# Patient Record
Sex: Female | Born: 1955
Health system: Southern US, Community
[De-identification: ages and names within clinical notes are randomized; demographics above are authoritative.]

## PROBLEM LIST (undated history)

## (undated) DIAGNOSIS — M19019 Primary osteoarthritis, unspecified shoulder: Secondary | ICD-10-CM

## (undated) DIAGNOSIS — E785 Hyperlipidemia, unspecified: Secondary | ICD-10-CM

## (undated) DIAGNOSIS — Z8639 Personal history of other endocrine, nutritional and metabolic disease: Secondary | ICD-10-CM

## (undated) DIAGNOSIS — F411 Generalized anxiety disorder: Secondary | ICD-10-CM

## (undated) DIAGNOSIS — J449 Chronic obstructive pulmonary disease, unspecified: Secondary | ICD-10-CM

## (undated) DIAGNOSIS — M545 Low back pain, unspecified: Secondary | ICD-10-CM

## (undated) DIAGNOSIS — L821 Other seborrheic keratosis: Secondary | ICD-10-CM

## (undated) DIAGNOSIS — I10 Essential (primary) hypertension: Secondary | ICD-10-CM

## (undated) DIAGNOSIS — F3289 Other specified depressive episodes: Secondary | ICD-10-CM

## (undated) DIAGNOSIS — J4489 Other specified chronic obstructive pulmonary disease: Secondary | ICD-10-CM

## (undated) DIAGNOSIS — F410 Panic disorder [episodic paroxysmal anxiety] without agoraphobia: Secondary | ICD-10-CM

## (undated) DIAGNOSIS — R5383 Other fatigue: Secondary | ICD-10-CM

## (undated) DIAGNOSIS — Z780 Asymptomatic menopausal state: Secondary | ICD-10-CM

## (undated) DIAGNOSIS — F329 Major depressive disorder, single episode, unspecified: Secondary | ICD-10-CM

## (undated) DIAGNOSIS — J309 Allergic rhinitis, unspecified: Secondary | ICD-10-CM

## (undated) DIAGNOSIS — E119 Type 2 diabetes mellitus without complications: Secondary | ICD-10-CM

## (undated) HISTORY — DX: Panic disorder (episodic paroxysmal anxiety): F41.0

## (undated) HISTORY — DX: Primary osteoarthritis, unspecified shoulder: M19.019

## (undated) HISTORY — DX: Low back pain, unspecified: M54.50

## (undated) HISTORY — DX: Other seborrheic keratosis: L82.1

## (undated) HISTORY — PX: APPENDECTOMY: SHX54

## (undated) HISTORY — DX: Personal history of other endocrine, nutritional and metabolic disease: Z86.39

## (undated) HISTORY — DX: Other fatigue: R53.83

## (undated) HISTORY — DX: Other specified depressive episodes: F32.89

## (undated) HISTORY — DX: Allergic rhinitis, unspecified: J30.9

## (undated) HISTORY — DX: Chronic obstructive pulmonary disease, unspecified: J44.9

## (undated) HISTORY — DX: Hyperlipidemia, unspecified: E78.5

## (undated) HISTORY — DX: Low back pain: M54.5

## (undated) HISTORY — DX: Type 2 diabetes mellitus without complications: E11.9

## (undated) HISTORY — DX: Other specified chronic obstructive pulmonary disease: J44.89

## (undated) HISTORY — PX: TONSILLECTOMY: SUR1361

## (undated) HISTORY — DX: Asymptomatic menopausal state: Z78.0

## (undated) HISTORY — DX: Essential (primary) hypertension: I10

## (undated) HISTORY — DX: Generalized anxiety disorder: F41.1

## (undated) HISTORY — DX: Major depressive disorder, single episode, unspecified: F32.9

## (undated) HISTORY — PX: TUBAL LIGATION: SHX77

---

## 2000-01-06 ENCOUNTER — Other Ambulatory Visit: Admission: RE | Admit: 2000-01-06 | Discharge: 2000-01-06 | Payer: Self-pay | Admitting: Family Medicine

## 2000-06-28 ENCOUNTER — Encounter: Admission: RE | Admit: 2000-06-28 | Discharge: 2000-09-26 | Payer: Self-pay | Admitting: Orthopaedic Surgery

## 2001-03-21 ENCOUNTER — Other Ambulatory Visit: Admission: RE | Admit: 2001-03-21 | Discharge: 2001-03-21 | Payer: Self-pay | Admitting: Family Medicine

## 2001-12-05 ENCOUNTER — Encounter: Admission: RE | Admit: 2001-12-05 | Discharge: 2001-12-05 | Payer: Self-pay | Admitting: General Surgery

## 2001-12-05 ENCOUNTER — Encounter: Payer: Self-pay | Admitting: General Surgery

## 2009-08-11 DIAGNOSIS — J449 Chronic obstructive pulmonary disease, unspecified: Secondary | ICD-10-CM

## 2009-08-11 DIAGNOSIS — M940 Chondrocostal junction syndrome [Tietze]: Secondary | ICD-10-CM | POA: Insufficient documentation

## 2009-08-12 ENCOUNTER — Ambulatory Visit: Payer: Self-pay | Admitting: Critical Care Medicine

## 2009-08-12 ENCOUNTER — Telehealth (INDEPENDENT_AMBULATORY_CARE_PROVIDER_SITE_OTHER): Payer: Self-pay | Admitting: *Deleted

## 2009-08-12 DIAGNOSIS — F3289 Other specified depressive episodes: Secondary | ICD-10-CM | POA: Insufficient documentation

## 2009-08-12 DIAGNOSIS — I10 Essential (primary) hypertension: Secondary | ICD-10-CM | POA: Insufficient documentation

## 2009-08-12 DIAGNOSIS — F17200 Nicotine dependence, unspecified, uncomplicated: Secondary | ICD-10-CM | POA: Insufficient documentation

## 2009-08-12 DIAGNOSIS — F329 Major depressive disorder, single episode, unspecified: Secondary | ICD-10-CM

## 2009-08-14 ENCOUNTER — Telehealth (INDEPENDENT_AMBULATORY_CARE_PROVIDER_SITE_OTHER): Payer: Self-pay | Admitting: *Deleted

## 2009-08-25 ENCOUNTER — Telehealth: Payer: Self-pay | Admitting: Internal Medicine

## 2009-09-02 ENCOUNTER — Ambulatory Visit: Payer: Self-pay | Admitting: Critical Care Medicine

## 2009-09-03 ENCOUNTER — Encounter: Payer: Self-pay | Admitting: Critical Care Medicine

## 2009-11-12 ENCOUNTER — Telehealth (INDEPENDENT_AMBULATORY_CARE_PROVIDER_SITE_OTHER): Payer: Self-pay | Admitting: *Deleted

## 2009-11-18 ENCOUNTER — Telehealth (INDEPENDENT_AMBULATORY_CARE_PROVIDER_SITE_OTHER): Payer: Self-pay | Admitting: *Deleted

## 2009-12-01 ENCOUNTER — Ambulatory Visit: Payer: Self-pay | Admitting: Critical Care Medicine

## 2009-12-23 ENCOUNTER — Telehealth (INDEPENDENT_AMBULATORY_CARE_PROVIDER_SITE_OTHER): Payer: Self-pay | Admitting: *Deleted

## 2009-12-24 ENCOUNTER — Encounter: Payer: Self-pay | Admitting: Critical Care Medicine

## 2012-09-13 ENCOUNTER — Ambulatory Visit: Payer: Self-pay | Admitting: Pulmonary Disease

## 2012-10-05 ENCOUNTER — Encounter: Payer: Self-pay | Admitting: *Deleted

## 2012-10-05 ENCOUNTER — Encounter: Payer: Self-pay | Admitting: Critical Care Medicine

## 2012-10-06 ENCOUNTER — Ambulatory Visit: Payer: Self-pay | Admitting: Critical Care Medicine

## 2012-10-13 ENCOUNTER — Ambulatory Visit: Payer: Self-pay | Admitting: Critical Care Medicine

## 2012-12-27 HISTORY — PX: BREAST BIOPSY: SHX20

## 2013-02-07 ENCOUNTER — Encounter: Payer: Self-pay | Admitting: Critical Care Medicine

## 2013-02-07 ENCOUNTER — Ambulatory Visit (INDEPENDENT_AMBULATORY_CARE_PROVIDER_SITE_OTHER): Payer: Managed Care, Other (non HMO) | Admitting: Critical Care Medicine

## 2013-02-07 ENCOUNTER — Ambulatory Visit (INDEPENDENT_AMBULATORY_CARE_PROVIDER_SITE_OTHER)
Admission: RE | Admit: 2013-02-07 | Discharge: 2013-02-07 | Disposition: A | Discharge status: Home / Self Care | Payer: Managed Care, Other (non HMO) | Source: Ambulatory Visit | Attending: Critical Care Medicine | Admitting: Critical Care Medicine

## 2013-02-07 VITALS — BP 112/68 | HR 93 | Temp 98.5°F | Ht 61.5 in | Wt 130.0 lb

## 2013-02-07 DIAGNOSIS — J441 Chronic obstructive pulmonary disease with (acute) exacerbation: Secondary | ICD-10-CM

## 2013-02-07 DIAGNOSIS — F17200 Nicotine dependence, unspecified, uncomplicated: Secondary | ICD-10-CM

## 2013-02-07 MED ORDER — FLUTICASONE PROPIONATE 50 MCG/ACT NA SUSP: 2.0000 | Freq: Every day | NASAL | Status: DC

## 2013-02-07 MED ORDER — ALBUTEROL SULFATE HFA 108 (90 BASE) MCG/ACT IN AERS: 1.0000 | INHALATION_SPRAY | Freq: Four times a day (QID) | RESPIRATORY_TRACT | Status: DC | PRN

## 2013-02-07 NOTE — Assessment & Plan Note (Signed)
Ongoing smoking use The patient was given 3-10 minutes of smoking cessation counseling The patient is to pursue nicotine replacement therapy with NicoDerm patch and Nicorette mini lozenges

## 2013-02-07 NOTE — Progress Notes (Signed)
Subjective:    Patient ID: Jeanne Becker, female    DOB: 12/15/1956, 57 y.o.   MRN: [id number]  HPI Comments: Hx of Copd Dx several years ago, not seen since 2010 Hx of bronchitis .  Still smokes, now down to 1PPD Pt ill last week and rx pred pulse, Zpak x 2  Shortness of Breath This is a chronic problem. The current episode started more than 1 year ago. The problem occurs daily (rest and exertional dyspnea). The problem has been gradually worsening. Associated symptoms include chest pain, rhinorrhea, sputum production and wheezing. Pertinent negatives include no abdominal pain, claudication, coryza, ear pain, fever, headaches, hemoptysis, leg pain, leg swelling, neck pain, orthopnea, PND, rash, sore throat, swollen glands, syncope or vomiting. The symptoms are aggravated by any activity, exercise, fumes, odors, smoke and weather changes. Associated symptoms comments: Cough is prod thick white mucus. Risk factors include smoking. She has tried beta agonist inhalers, steroid inhalers and oral steroids for the symptoms. The treatment provided moderate relief. Her past medical history is significant for COPD. There is no history of allergies, aspirin allergies, asthma, bronchiolitis, CAD, chronic lung disease, DVT, a heart failure, PE, pneumonia or a recent surgery.   Past Medical History  Diagnosis Date  . Acute bronchitis   . Acute suppurative otitis media without spontaneous rupture of eardrum   . Allergic rhinitis, cause unspecified   . Anxiety state, unspecified   . Unspecified arthropathy, shoulder region   . Essential hypertension, benign   . Obstructive chronic bronchitis with exacerbation   . Chronic airway obstruction, not elsewhere classified   . Depressive disorder, not elsewhere classified   . Type II or unspecified type diabetes mellitus without mention of complication, not stated as uncontrolled   . Fatigue   . Hyperlipidemia   . Lower back pain   . Menopause   . Panic  disorder without agoraphobia   . Seborrheic keratosis      Family History  Problem Relation Age of Onset  . Cancer    . Allergies    . Diabetes    . Heart disease    . Hypertension    . Migraines    . Osteoporosis    . Seizures       History   Social History  . Marital Status: Single    Spouse Name: N/A    Number of Children: N/A  . Years of Education: N/A   Occupational History  . Not on file.   Social History Main Topics  . Smoking status: Current Every Day Smoker -- 1.00 packs/day for 30 years    Types: Cigarettes  . Smokeless tobacco: Not on file     Comment: greater 50 pack yr  . Alcohol Use: Yes     Comment: 2 drinks/day or fewer  . Drug Use: Not on file  . Sexually Active: Not on file   Other Topics Concern  . Not on file   Social History Narrative  . No narrative on file     Allergies  Allergen Reactions  . Penicillins      Outpatient Prescriptions Prior to Visit  Medication Sig Dispense Refill  . amLODipine-olmesartan (AZOR) 5-40 MG per tablet Take 1 tablet by mouth daily.      Marland Kitchen aspirin 81 MG tablet Take 81 mg by mouth daily.      . clonazePAM (KLONOPIN) 0.5 MG tablet Take 0.5 mg by mouth 2 (two) times daily as needed.      Marland Kitchen  escitalopram (LEXAPRO) 10 MG tablet Take 10 mg by mouth daily.      . metFORMIN (GLUCOPHAGE-XR) 500 MG 24 hr tablet Take 500 mg by mouth daily with breakfast.      . rosuvastatin (CRESTOR) 10 MG tablet Take 10 mg by mouth daily.       No facility-administered medications prior to visit.       Review of Systems  Constitutional: Negative for fever and unexpected weight change.  HENT: Positive for congestion, rhinorrhea, postnasal drip and sinus pressure. Negative for ear pain, nosebleeds, sore throat, sneezing, trouble swallowing, neck pain and dental problem.   Eyes: Negative for redness and itching.  Respiratory: Positive for cough, sputum production, shortness of breath and wheezing. Negative for hemoptysis and chest  tightness.   Cardiovascular: Positive for chest pain. Negative for palpitations, orthopnea, claudication, leg swelling, syncope and PND.  Gastrointestinal: Negative for nausea, vomiting and abdominal pain.  Genitourinary: Negative for dysuria.  Musculoskeletal: Negative for joint swelling.  Skin: Negative for rash.  Neurological: Negative for headaches.  Hematological: Does not bruise/bleed easily.  Psychiatric/Behavioral: Negative for dysphoric mood. The patient is not nervous/anxious.        Objective:   Physical Exam Filed Vitals:   02/07/13 0945  BP: 112/68  Pulse: 93  Temp: 98.5 F (36.9 C)  TempSrc: Oral  Height: 5' 1.5" (1.562 m)  Weight: 130 lb (58.968 kg)  SpO2: 98%    Gen: Pleasant, well-nourished, in no distress,  normal affect  ENT: No lesions,  mouth clear,  oropharynx clear, no postnasal drip  Neck: No JVD, no TMG, no carotid bruits  Lungs: No use of accessory muscles, no dullness to percussion, distant breath sounds a few expired wheezes  Cardiovascular: RRR, heart sounds normal, no murmur or gallops, no peripheral edema  Abdomen: soft and NT, no HSM,  BS normal  Musculoskeletal: No deformities, no cyanosis or clubbing  Neuro: alert, non focal  Skin: Warm, no lesions or rashes  Dg Chest 2 View  02/07/2013  *RADIOLOGY REPORT*  Clinical Data: Cough, shortness of breath, chest pain.  CHEST - 2 VIEW  Comparison: 05/29/2010  Findings: Slight peribronchial thickening. Heart and mediastinal contours are within normal limits.  No focal opacities or effusions.  No acute bony abnormality.  IMPRESSION: Slight bronchitic changes.   Original Report Authenticated By: Charlett Nose, M.D.           Assessment & Plan:   No problem-specific assessment & plan notes found for this encounter.   Updated Medication List Outpatient Encounter Prescriptions as of 02/07/2013  Medication Sig Dispense Refill  . albuterol (PROVENTIL HFA;VENTOLIN HFA) 108 (90 BASE) MCG/ACT  inhaler Inhale 1-2 puffs into the lungs every 6 (six) hours as needed for wheezing.  1 Inhaler  6  . albuterol (PROVENTIL) (2.5 MG/3ML) 0.083% nebulizer solution Take 2.5 mg by nebulization 2 (two) times daily.      Marland Kitchen amLODipine-olmesartan (AZOR) 5-40 MG per tablet Take 1 tablet by mouth daily.      Marland Kitchen aspirin 81 MG tablet Take 81 mg by mouth daily.      Marland Kitchen azithromycin (ZITHROMAX) 250 MG tablet Take 250 mg by mouth daily. Take two once then one daily until gone      . budesonide-formoterol (SYMBICORT) 160-4.5 MCG/ACT inhaler Inhale 2 puffs into the lungs 2 (two) times daily.      . clonazePAM (KLONOPIN) 0.5 MG tablet Take 0.5 mg by mouth 2 (two) times daily as needed.      Marland Kitchen  escitalopram (LEXAPRO) 10 MG tablet Take 10 mg by mouth daily.      . metFORMIN (GLUCOPHAGE-XR) 500 MG 24 hr tablet Take 500 mg by mouth daily with breakfast.      . predniSONE (DELTASONE) 20 MG tablet Take 20 mg by mouth daily. Three x 3 days  Two x 3 days one x 3 days      . rosuvastatin (CRESTOR) 10 MG tablet Take 10 mg by mouth daily.      . [DISCONTINUED] albuterol (PROVENTIL HFA;VENTOLIN HFA) 108 (90 BASE) MCG/ACT inhaler Inhale 1-2 puffs into the lungs every 6 (six) hours as needed for wheezing.      . fluticasone (FLONASE) 50 MCG/ACT nasal spray Place 2 sprays into the nose daily.  16 g  2   No facility-administered encounter medications on file as of 02/07/2013.

## 2013-02-07 NOTE — Assessment & Plan Note (Signed)
Chronic obstructive lung disease with recurrent exacerbation and ongoing smoking use Plan Start flonase two puff daily each nostril Use symbicort two puff twice daily, work on inhaler technique Stop smoking , use nicoderm patch step one and nicorette minis 4mg  2-4 x per day Finish prednisone and zpak Chest xray today Schedule pulmonary functions Return 2 months

## 2013-02-07 NOTE — Patient Instructions (Addendum)
Start flonase two puff daily each nostril Use symbicort two puff twice daily, work on inhaler technique Stop smoking , use nicoderm patch step one and nicorette minis 4mg  2-4 x per day Finish prednisone and zpak Chest xray today Schedule pulmonary functions Return 2 months

## 2013-02-07 NOTE — Progress Notes (Signed)
Quick Note:  Notify the patient that the Xray is stable and no pneumonia No change in medications are recommended. Continue current meds as prescribed at last office visit ______ 

## 2013-02-12 ENCOUNTER — Telehealth: Payer: Self-pay | Admitting: Critical Care Medicine

## 2013-02-12 NOTE — Telephone Encounter (Signed)
Notes Recorded by Storm Frisk, MD on 02/07/2013 at 1:30 PM Notify the patient that the Xray is stable and no pneumonia No change in medications are recommended. Continue current meds as prescribed at last office visit      ATC the pt back with results, NA and no option to leave a msg, [facility]

## 2013-02-12 NOTE — Telephone Encounter (Signed)
Pt called back again re: same. Says she has to go to work soon and wanted results this morning. Jeanne Becker

## 2013-02-13 NOTE — Telephone Encounter (Signed)
Called, spoke with pt. Informed her of cxr results and recs per Dr. Delford Field.  She verbalized understanding of this and voiced no further questions or concerns at this time.

## 2013-02-13 NOTE — Progress Notes (Signed)
Quick Note:  Spoke with pt. Informed her of cxr results and recs per Dr. Delford Field. She verbalized understanding of this and voiced no further questions or concerns at this time. ______

## 2013-04-24 ENCOUNTER — Encounter: Payer: Self-pay | Admitting: Critical Care Medicine

## 2013-04-24 ENCOUNTER — Ambulatory Visit (INDEPENDENT_AMBULATORY_CARE_PROVIDER_SITE_OTHER): Payer: Managed Care, Other (non HMO) | Admitting: Critical Care Medicine

## 2013-04-24 VITALS — BP 114/70 | HR 95 | Temp 98.4°F | Ht 61.0 in | Wt 127.0 lb

## 2013-04-24 DIAGNOSIS — R0602 Shortness of breath: Secondary | ICD-10-CM

## 2013-04-24 DIAGNOSIS — J441 Chronic obstructive pulmonary disease with (acute) exacerbation: Secondary | ICD-10-CM

## 2013-04-24 LAB — PULMONARY FUNCTION TEST

## 2013-04-24 MED ORDER — GUAIFENESIN ER 1200 MG PO TB12: 1.0000 | ORAL_TABLET | Freq: Two times a day (BID) | ORAL | Status: DC

## 2013-04-24 NOTE — Progress Notes (Signed)
Subjective:    Patient ID: Jeanne Becker, female    DOB: 1956/12/24, 57 y.o.   MRN: [id number]  HPI 04/24/2013 Now off cigarettes. Smokes about 5-6 puffs per day of e-cigarettes.  Patches and pills both made ill.  PFTs c/w asthmatic bronchitis.   Still coughing, thick white mucus. Still dyspneic esp at work, with the patient is exposed to [place] fumes and smoke fumes working at a barbecue   Past Medical History  Diagnosis Date  . Acute bronchitis   . Acute suppurative otitis media without spontaneous rupture of eardrum   . Allergic rhinitis, cause unspecified   . Anxiety state, unspecified   . Unspecified arthropathy, shoulder region   . Essential hypertension, benign   . Obstructive chronic bronchitis with exacerbation   . Chronic airway obstruction, not elsewhere classified   . Depressive disorder, not elsewhere classified   . Type II or unspecified type diabetes mellitus without mention of complication, not stated as uncontrolled   . Fatigue   . Hyperlipidemia   . Lower back pain   . Menopause   . Panic disorder without agoraphobia   . Seborrheic keratosis      Family History  Problem Relation Age of Onset  . Cancer    . Allergies    . Diabetes    . Heart disease    . Hypertension    . Migraines    . Osteoporosis    . Seizures       History   Social History  . Marital Status: Single    Spouse Name: N/A    Number of Children: N/A  . Years of Education: N/A   Occupational History  . Not on file.   Social History Main Topics  . Smoking status: Former Smoker -- 1.00 packs/day for 30 years    Types: Cigarettes    Quit date: 04/11/2013  . Smokeless tobacco: Not on file     Comment: greater 50 pack yr  . Alcohol Use: Yes     Comment: 2 drinks/day or fewer  . Drug Use: Not on file  . Sexually Active: Not on file   Other Topics Concern  . Not on file   Social History Narrative  . No narrative on file     Allergies  Allergen Reactions  .  Penicillins      Outpatient Prescriptions Prior to Visit  Medication Sig Dispense Refill  . albuterol (PROVENTIL HFA;VENTOLIN HFA) 108 (90 BASE) MCG/ACT inhaler Inhale 1-2 puffs into the lungs every 6 (six) hours as needed for wheezing.  1 Inhaler  6  . albuterol (PROVENTIL) (2.5 MG/3ML) 0.083% nebulizer solution Take 2.5 mg by nebulization 2 (two) times daily.      Marland Kitchen amLODipine-olmesartan (AZOR) 5-40 MG per tablet Take 1 tablet by mouth daily.      Marland Kitchen aspirin 81 MG tablet Take 81 mg by mouth daily.      . budesonide-formoterol (SYMBICORT) 160-4.5 MCG/ACT inhaler Inhale 2 puffs into the lungs 2 (two) times daily.      . clonazePAM (KLONOPIN) 0.5 MG tablet Take 0.5 mg by mouth 2 (two) times daily as needed.      . fluticasone (FLONASE) 50 MCG/ACT nasal spray Place 2 sprays into the nose daily.  16 g  2  . metFORMIN (GLUCOPHAGE-XR) 500 MG 24 hr tablet Take 500 mg by mouth daily with breakfast.      . rosuvastatin (CRESTOR) 10 MG tablet Take 10 mg by mouth daily.      Marland Kitchen  azithromycin (ZITHROMAX) 250 MG tablet Take 250 mg by mouth daily. Take two once then one daily until gone      . escitalopram (LEXAPRO) 10 MG tablet Take 10 mg by mouth daily.      . predniSONE (DELTASONE) 20 MG tablet Take 20 mg by mouth daily. Three x 3 days  Two x 3 days one x 3 days       No facility-administered medications prior to visit.       Review of Systems  Constitutional: Negative for unexpected weight change.  HENT: Positive for congestion, postnasal drip and sinus pressure. Negative for nosebleeds, sneezing, trouble swallowing and dental problem.   Eyes: Negative for redness and itching.  Respiratory: Positive for cough. Negative for chest tightness.   Cardiovascular: Negative for palpitations.  Gastrointestinal: Negative for nausea.  Genitourinary: Negative for dysuria.  Musculoskeletal: Negative for joint swelling.  Hematological: Does not bruise/bleed easily.  Psychiatric/Behavioral: Negative for  dysphoric mood. The patient is not nervous/anxious.        Objective:   Physical Exam  Filed Vitals:   04/24/13 0958  BP: 114/70  Pulse: 95  Temp: 98.4 F (36.9 C)  TempSrc: Oral  Height: 5\' 1"  (1.549 m)  Weight: 127 lb (57.607 kg)  SpO2: 93%    Gen: Pleasant, well-nourished, in no distress,  normal affect  ENT: No lesions,  mouth clear,  oropharynx clear, no postnasal drip  Neck: No JVD, no TMG, no carotid bruits  Lungs: No use of accessory muscles, no dullness to percussion, distant breath sounds a few expired wheezes  Cardiovascular: RRR, heart sounds normal, no murmur or gallops, no peripheral edema  Abdomen: soft and NT, no HSM,  BS normal  Musculoskeletal: No deformities, no cyanosis or clubbing  Neuro: alert, non focal  Skin: Warm, no lesions or rashes  No results found.  Pulmonary functions from 04/24/2013 reviewed and show mild obstruction in the large airways and moderate obstruction in the small airways with air trapping and preserved diffusion capacity      Assessment & Plan:   Chronic obstructive lung disease with recurrent exacerbation and ongoing smoking use Chronic obstructive lung disease with recurrent exacerbations owing to ongoing smoking use Slowly improved with reduction of cigarettes per is still an issue with the use of E cigarettes Plan Start Mucinex 1200mg  (max strength) one twice daily Wean e cigs off over 3 months Stay on symbicort twice daily Return 4 months     Updated Medication List Outpatient Encounter Prescriptions as of 04/24/2013  Medication Sig Dispense Refill  . albuterol (PROVENTIL HFA;VENTOLIN HFA) 108 (90 BASE) MCG/ACT inhaler Inhale 1-2 puffs into the lungs every 6 (six) hours as needed for wheezing.  1 Inhaler  6  . albuterol (PROVENTIL) (2.5 MG/3ML) 0.083% nebulizer solution Take 2.5 mg by nebulization 2 (two) times daily.      Marland Kitchen amLODipine-olmesartan (AZOR) 5-40 MG per tablet Take 1 tablet by mouth daily.       Marland Kitchen aspirin 81 MG tablet Take 81 mg by mouth daily.      . budesonide-formoterol (SYMBICORT) 160-4.5 MCG/ACT inhaler Inhale 2 puffs into the lungs 2 (two) times daily.      . clonazePAM (KLONOPIN) 0.5 MG tablet Take 0.5 mg by mouth 2 (two) times daily as needed.      Marland Kitchen escitalopram (LEXAPRO) 20 MG tablet Take 20 mg by mouth daily.      . fluticasone (FLONASE) 50 MCG/ACT nasal spray Place 2 sprays into the nose daily.  16 g  2  . metFORMIN (GLUCOPHAGE-XR) 500 MG 24 hr tablet Take 500 mg by mouth daily with breakfast.      . rosuvastatin (CRESTOR) 10 MG tablet Take 10 mg by mouth daily.      . Guaifenesin (MUCINEX MAXIMUM STRENGTH) 1200 MG TB12 Take 1 tablet (1,200 mg total) by mouth 2 (two) times daily.  14 each  0  . [DISCONTINUED] azithromycin (ZITHROMAX) 250 MG tablet Take 250 mg by mouth daily. Take two once then one daily until gone      . [DISCONTINUED] escitalopram (LEXAPRO) 10 MG tablet Take 10 mg by mouth daily.      . [DISCONTINUED] predniSONE (DELTASONE) 20 MG tablet Take 20 mg by mouth daily. Three x 3 days  Two x 3 days one x 3 days       No facility-administered encounter medications on file as of 04/24/2013.

## 2013-04-24 NOTE — Progress Notes (Signed)
PFT done today. 

## 2013-04-24 NOTE — Assessment & Plan Note (Signed)
Chronic obstructive lung disease with recurrent exacerbations owing to ongoing smoking use Slowly improved with reduction of cigarettes per is still an issue with the use of E cigarettes Plan Start Mucinex 1200mg  (max strength) one twice daily Wean e cigs off over 3 months Stay on symbicort twice daily Return 4 months

## 2013-04-24 NOTE — Patient Instructions (Addendum)
Start Mucinex 1200mg  (max strength) one twice daily Wean e cigs off over 3 months Stay on symbicort twice daily Return 4 months

## 2013-05-10 ENCOUNTER — Encounter: Payer: Self-pay | Admitting: Critical Care Medicine

## 2013-08-21 ENCOUNTER — Ambulatory Visit: Payer: Managed Care, Other (non HMO) | Admitting: Critical Care Medicine

## 2013-08-29 ENCOUNTER — Ambulatory Visit: Payer: Managed Care, Other (non HMO) | Admitting: Critical Care Medicine

## 2013-08-31 ENCOUNTER — Ambulatory Visit (INDEPENDENT_AMBULATORY_CARE_PROVIDER_SITE_OTHER): Payer: Managed Care, Other (non HMO) | Admitting: Critical Care Medicine

## 2013-08-31 ENCOUNTER — Encounter: Payer: Self-pay | Admitting: Critical Care Medicine

## 2013-08-31 VITALS — BP 116/78 | HR 92 | Temp 98.0°F | Ht 61.5 in | Wt 129.8 lb

## 2013-08-31 DIAGNOSIS — J441 Chronic obstructive pulmonary disease with (acute) exacerbation: Secondary | ICD-10-CM

## 2013-08-31 DIAGNOSIS — Z23 Encounter for immunization: Secondary | ICD-10-CM

## 2013-08-31 MED ORDER — BUDESONIDE-FORMOTEROL FUMARATE 160-4.5 MCG/ACT IN AERO: 2.0000 | INHALATION_SPRAY | Freq: Two times a day (BID) | RESPIRATORY_TRACT | Status: DC

## 2013-08-31 MED ORDER — ALBUTEROL SULFATE HFA 108 (90 BASE) MCG/ACT IN AERS: 1.0000 | INHALATION_SPRAY | Freq: Four times a day (QID) | RESPIRATORY_TRACT | Status: DC | PRN

## 2013-08-31 NOTE — Patient Instructions (Addendum)
No change in medications Use nicoderm 7mg  patch and eliminate the e cig A Flu vaccine was given A pneumovax was given Return 4 months

## 2013-08-31 NOTE — Progress Notes (Signed)
Subjective:    Patient ID: Jeanne Becker, female    DOB: [birth year], 57 y.o.   MRN: [id number]  HPI  04/24/2013 Now off cigarettes. Smokes about 5-6 puffs per day of e-cigarettes.  Patches and pills both made ill.  PFTs c/w asthmatic bronchitis.   Still coughing, thick white mucus. Still dyspneic esp at work, with the patient is exposed to [place] fumes and smoke fumes working at a barbecue  08/31/2013 Chief Complaint  Patient presents with  . 4 month follow up    Has good days and bad days.  Hot, humid weather affects breathing.  Has dry cough, wheezing, and burning in ribs.    Dyspnea about the same.  Not able to work and signed up for disability.  Notes a dry cough and if bend over burns in ribs. Quit cigarettes.  Min e cigs. Nausea from nicorette minis.   Past Medical History  Diagnosis Date  . Allergic rhinitis, cause unspecified   . Anxiety state, unspecified   . Unspecified arthropathy, shoulder region   . Essential hypertension, benign   . Chronic airway obstruction, not elsewhere classified   . Depressive disorder, not elsewhere classified   . Type II or unspecified type diabetes mellitus without mention of complication, not stated as uncontrolled   . Fatigue   . Hyperlipidemia   . Lower back pain   . Menopause   . Panic disorder without agoraphobia   . Seborrheic keratosis      Family History  Problem Relation Age of Onset  . Cancer    . Allergies    . Diabetes    . Heart disease    . Hypertension    . Migraines    . Osteoporosis    . Seizures       History   Social History  . Marital Status: Single    Spouse Name: N/A    Number of Children: N/A  . Years of Education: N/A   Occupational History  . Not on file.   Social History Main Topics  . Smoking status: Former Smoker -- 1.00 packs/day for 30 years    Types: Cigarettes    Quit date: 04/11/2013  . Smokeless tobacco: Never Used     Comment: greater 50 pack yr.  Occasional e-cig use.   Marland Kitchen  Alcohol Use: Yes     Comment: 2 drinks/day or fewer  . Drug Use: Not on file  . Sexual Activity: Not on file   Other Topics Concern  . Not on file   Social History Narrative  . No narrative on file     Allergies  Allergen Reactions  . Penicillins      Outpatient Prescriptions Prior to Visit  Medication Sig Dispense Refill  . albuterol (PROVENTIL) (2.5 MG/3ML) 0.083% nebulizer solution Take 2.5 mg by nebulization 2 (two) times daily.      Marland Kitchen aspirin 81 MG tablet Take 81 mg by mouth daily.      . clonazePAM (KLONOPIN) 0.5 MG tablet Take 0.5 mg by mouth 2 (two) times daily as needed.      Marland Kitchen escitalopram (LEXAPRO) 20 MG tablet Take 20 mg by mouth daily.      . metFORMIN (GLUCOPHAGE-XR) 500 MG 24 hr tablet Take 500 mg by mouth daily with breakfast.      . rosuvastatin (CRESTOR) 10 MG tablet Take 10 mg by mouth daily.      . budesonide-formoterol (SYMBICORT) 160-4.5 MCG/ACT inhaler Inhale 2 puffs into the lungs 2 (  two) times daily.      . fluticasone (FLONASE) 50 MCG/ACT nasal spray Place 2 sprays into the nose daily.  16 g  2  . Guaifenesin (MUCINEX MAXIMUM STRENGTH) 1200 MG TB12 Take 1 tablet (1,200 mg total) by mouth 2 (two) times daily.  14 each  0  . albuterol (PROVENTIL HFA;VENTOLIN HFA) 108 (90 BASE) MCG/ACT inhaler Inhale 1-2 puffs into the lungs every 6 (six) hours as needed for wheezing.  1 Inhaler  6  . amLODipine-olmesartan (AZOR) 5-40 MG per tablet Take 1 tablet by mouth daily.       No facility-administered medications prior to visit.       Review of Systems  Constitutional: Negative for unexpected weight change.  HENT: Positive for congestion, postnasal drip and sinus pressure. Negative for nosebleeds, sneezing, trouble swallowing and dental problem.   Eyes: Negative for redness and itching.  Respiratory: Positive for cough. Negative for chest tightness.   Cardiovascular: Negative for palpitations.  Gastrointestinal: Negative for nausea.  Genitourinary: Negative  for dysuria.  Musculoskeletal: Negative for joint swelling.  Hematological: Does not bruise/bleed easily.  Psychiatric/Behavioral: Negative for dysphoric mood. The patient is not nervous/anxious.        Objective:   Physical Exam  Filed Vitals:   08/31/13 0916  BP: 116/78  Pulse: 92  Temp: 98 F (36.7 C)  TempSrc: Oral  Height: 5' 1.5" (1.562 m)  Weight: 129 lb 12.8 oz (58.877 kg)  SpO2: 96%    Gen: Pleasant, well-nourished, in no distress,  normal affect  ENT: No lesions,  mouth clear,  oropharynx clear, no postnasal drip  Neck: No JVD, no TMG, no carotid bruits  Lungs: No use of accessory muscles, no dullness to percussion, distant breath sounds a few expired wheezes  Cardiovascular: RRR, heart sounds normal, no murmur or gallops, no peripheral edema  Abdomen: soft and NT, no HSM,  BS normal  Musculoskeletal: No deformities, no cyanosis or clubbing  Neuro: alert, non focal  Skin: Warm, no lesions or rashes  No results found.      Assessment & Plan:   Chronic obstructive lung disease with recurrent exacerbation and ongoing smoking use gold stage B. Chronic obstructive lung disease due to recurrent exacerbations related to ongoing smoking use Relatively preserved lung function with small airway obstruction noted Plan No change in medications Use nicoderm 7mg  patch and eliminate the e cig A Flu vaccine was given A pneumovax was given Return 4 months     Updated Medication List Outpatient Encounter Prescriptions as of 08/31/2013  Medication Sig Dispense Refill  . albuterol (PROVENTIL) (2.5 MG/3ML) 0.083% nebulizer solution Take 2.5 mg by nebulization 2 (two) times daily.      Marland Kitchen aspirin 81 MG tablet Take 81 mg by mouth daily.      . budesonide-formoterol (SYMBICORT) 160-4.5 MCG/ACT inhaler Inhale 2 puffs into the lungs 2 (two) times daily.  1 Inhaler  6  . clonazePAM (KLONOPIN) 0.5 MG tablet Take 0.5 mg by mouth 2 (two) times daily as needed.      Marland Kitchen  escitalopram (LEXAPRO) 20 MG tablet Take 20 mg by mouth daily.      . metFORMIN (GLUCOPHAGE-XR) 500 MG 24 hr tablet Take 500 mg by mouth daily with breakfast.      . rosuvastatin (CRESTOR) 10 MG tablet Take 10 mg by mouth daily.      . [DISCONTINUED] budesonide-formoterol (SYMBICORT) 160-4.5 MCG/ACT inhaler Inhale 2 puffs into the lungs 2 (two) times daily.      Marland Kitchen  albuterol (PROVENTIL HFA;VENTOLIN HFA) 108 (90 BASE) MCG/ACT inhaler Inhale 1-2 puffs into the lungs every 6 (six) hours as needed for wheezing.  1 Inhaler  6  . albuterol (PROVENTIL HFA;VENTOLIN HFA) 108 (90 BASE) MCG/ACT inhaler Inhale 1-2 puffs into the lungs every 6 (six) hours as needed for wheezing.      Marland Kitchen amLODipine-olmesartan (AZOR) 5-40 MG per tablet Take 1 tablet by mouth daily.      . fluticasone (FLONASE) 50 MCG/ACT nasal spray Place 2 sprays into the nose daily.  16 g  2  . Guaifenesin (MUCINEX MAXIMUM STRENGTH) 1200 MG TB12 Take 1 tablet (1,200 mg total) by mouth 2 (two) times daily.  14 each  0  . [DISCONTINUED] albuterol (PROVENTIL HFA;VENTOLIN HFA) 108 (90 BASE) MCG/ACT inhaler Inhale 1-2 puffs into the lungs every 6 (six) hours as needed for wheezing.  1 Inhaler  6  . [DISCONTINUED] amLODipine-olmesartan (AZOR) 5-40 MG per tablet Take 1 tablet by mouth daily.       No facility-administered encounter medications on file as of 08/31/2013.

## 2013-08-31 NOTE — Assessment & Plan Note (Signed)
Chronic obstructive lung disease due to recurrent exacerbations related to ongoing smoking use Relatively preserved lung function with small airway obstruction noted Plan No change in medications Use nicoderm 7mg  patch and eliminate the e cig A Flu vaccine was given A pneumovax was given Return 4 months

## 2013-09-17 ENCOUNTER — Telehealth: Payer: Self-pay | Admitting: *Deleted

## 2013-09-17 MED ORDER — BUDESONIDE-FORMOTEROL FUMARATE 160-4.5 MCG/ACT IN AERO: 2.0000 | INHALATION_SPRAY | Freq: Two times a day (BID) | RESPIRATORY_TRACT | Status: DC

## 2013-09-17 NOTE — Telephone Encounter (Signed)
Application and rx has been signed by PW. I have attempted to call pt to see if she would like this mailed or would like to pick it up as the financial documents are not attached. Received fast busy signal from home # and received msg that "the person you are trying to reach is unavailable. Please try your call again later" from cell #. WCB

## 2013-09-17 NOTE — Telephone Encounter (Signed)
Received pt assistance forms for Symbicort from pt. Form has been completed and will need PW's signature. I have printed Symbicort rx off for PW to sign along with pt assistance app.

## 2013-09-18 NOTE — Telephone Encounter (Signed)
I ATC pt again - received same responses as I did yesterday from both #s.

## 2013-09-20 NOTE — Telephone Encounter (Signed)
ATC pt again - received same responses from both #. I also ATC # listed as pt's emergency contact, [phone].  This # is not working either.

## 2013-09-21 ENCOUNTER — Encounter: Payer: Self-pay | Admitting: *Deleted

## 2013-09-21 NOTE — Telephone Encounter (Signed)
ATC pt again and the emergency contact #.   As I have attempted to reach pt multiple times with no success, will sign off on msg and send pt a letter. I still have these forms.

## 2013-09-27 ENCOUNTER — Telehealth: Payer: Self-pay | Admitting: Critical Care Medicine

## 2013-09-27 NOTE — Telephone Encounter (Signed)
Pt assistance application mailed to pt's home address so she can provide the requested financial documentation with application.  Pt aware.  Pt states he new phone # is [phone].  But when trying to call pt back at this #, it was not a working #.

## 2013-10-12 ENCOUNTER — Telehealth: Payer: Self-pay | Admitting: Critical Care Medicine

## 2013-10-15 NOTE — Telephone Encounter (Signed)
lmtcb x1 for pt. 

## 2013-10-16 NOTE — Telephone Encounter (Signed)
LMTCB for the pt on her mobile

## 2013-10-16 NOTE — Telephone Encounter (Signed)
LMOM TCB x2 for pt on cell # ATC home number x2 > line busy both times.  WCB.

## 2013-10-17 NOTE — Telephone Encounter (Signed)
LMTC on cell #.  Unable to leave message at home number

## 2013-10-19 NOTE — Telephone Encounter (Signed)
LMTCB again on cell#. ATC home number is has now been disconnected. If no call back will close message per protocol. Carron Curie, CMA

## 2014-01-21 ENCOUNTER — Ambulatory Visit: Payer: Managed Care, Other (non HMO) | Admitting: Critical Care Medicine

## 2014-01-22 ENCOUNTER — Encounter: Payer: Self-pay | Admitting: Critical Care Medicine

## 2014-01-23 ENCOUNTER — Telehealth: Payer: Self-pay | Admitting: Critical Care Medicine

## 2014-01-23 NOTE — Telephone Encounter (Signed)
Pt advised. She set appt for 02-04-14, first day pt can come. Carron CurieJennifer Castillo, CMA

## 2014-01-23 NOTE — Telephone Encounter (Signed)
Returning call can be reached at [phone].Jeanne EvertsJuanita S Davis

## 2014-01-23 NOTE — Telephone Encounter (Signed)
Called spoke with patient who reports a dry cough that occasionally produces white mucus, wheezing and increased SOB x1 week.  Pt believes her symptoms are d/t the weather. She denies any chest tightness, f/c/s, head congestion, PND, hemoptysis.    Pt had asked for otc recommendations.  Advised pt mucinex dm twice daily as needed for cough/congestion.  However, due to her other symptoms pt is requesting further recommendations from PW.  Dr Delford FieldWright please advise, thank you.  [facility] Allergies  Allergen Reactions  . Penicillins   Last ov w/ PW 9.5.14

## 2014-01-23 NOTE — Telephone Encounter (Signed)
Can try mucinex DM but prob needs an ov

## 2014-02-04 ENCOUNTER — Ambulatory Visit (INDEPENDENT_AMBULATORY_CARE_PROVIDER_SITE_OTHER): Payer: Managed Care, Other (non HMO) | Admitting: Critical Care Medicine

## 2014-02-04 ENCOUNTER — Encounter: Payer: Self-pay | Admitting: Critical Care Medicine

## 2014-02-04 VITALS — BP 118/76 | HR 94 | Temp 98.3°F | Ht 61.5 in | Wt 135.0 lb

## 2014-02-04 DIAGNOSIS — J441 Chronic obstructive pulmonary disease with (acute) exacerbation: Secondary | ICD-10-CM

## 2014-02-04 DIAGNOSIS — J019 Acute sinusitis, unspecified: Secondary | ICD-10-CM

## 2014-02-04 MED ORDER — AZITHROMYCIN 250 MG PO TABS: ORAL_TABLET | ORAL | Status: DC

## 2014-02-04 MED ORDER — ALBUTEROL SULFATE HFA 108 (90 BASE) MCG/ACT IN AERS: 1.0000 | INHALATION_SPRAY | Freq: Four times a day (QID) | RESPIRATORY_TRACT | Status: DC | PRN

## 2014-02-04 NOTE — Progress Notes (Signed)
Subjective:    Patient ID: Jeanne Becker, female    DOB: [birth year], 58 y.o.   MRN: [id number]  HPI 02/04/2014 Chief Complaint  Patient presents with  . Follow-up    C/O: sinus pressure, PND and cough wtih thick white mucus x2 weeks  Pt c/o sinus pressure. Pt notes some cough .  No longer smoking. Pt denies any significant sore throat, nasal congestion or excess secretions, fever, chills, sweats, unintended weight loss, pleurtic or exertional chest pain, orthopnea PND, or leg swelling Pt denies any increase in rescue therapy over baseline, denies waking up needing it or having any early am or nocturnal exacerbations of coughing/wheezing/or dyspnea. Pt also denies any obvious fluctuation in symptoms with  weather or environmental change or other alleviating or aggravating factors   Past Medical History  Diagnosis Date  . Allergic rhinitis, cause unspecified   . Anxiety state, unspecified   . Unspecified arthropathy, shoulder region   . Essential hypertension, benign   . Chronic airway obstruction, not elsewhere classified   . Depressive disorder, not elsewhere classified   . Type II or unspecified type diabetes mellitus without mention of complication, not stated as uncontrolled   . Fatigue   . Hyperlipidemia   . Lower back pain   . Menopause   . Panic disorder without agoraphobia   . Seborrheic keratosis      Family History  Problem Relation Age of Onset  . Cancer    . Allergies    . Diabetes    . Heart disease    . Hypertension    . Migraines    . Osteoporosis    . Seizures       History   Social History  . Marital Status: Single    Spouse Name: N/A    Number of Children: N/A  . Years of Education: N/A   Occupational History  . Not on file.   Social History Main Topics  . Smoking status: Former Smoker -- 1.00 packs/day for 30 years    Types: Cigarettes    Quit date: 04/11/2013  . Smokeless tobacco: Never Used     Comment: greater 50 pack yr.  Occasional  e-cig use.   Marland Kitchen Alcohol Use: Yes     Comment: 2 drinks/day or fewer  . Drug Use: Not on file  . Sexual Activity: Not on file   Other Topics Concern  . Not on file   Social History Narrative  . No narrative on file     Allergies  Allergen Reactions  . Penicillins      Outpatient Prescriptions Prior to Visit  Medication Sig Dispense Refill  . albuterol (PROVENTIL) (2.5 MG/3ML) 0.083% nebulizer solution Take 2.5 mg by nebulization 2 (two) times daily.      Marland Kitchen amLODipine-olmesartan (AZOR) 5-40 MG per tablet Take 1 tablet by mouth daily.      Marland Kitchen aspirin 81 MG tablet Take 81 mg by mouth daily.      . clonazePAM (KLONOPIN) 0.5 MG tablet Take 0.5 mg by mouth 2 (two) times daily as needed.      Marland Kitchen escitalopram (LEXAPRO) 20 MG tablet Take 20 mg by mouth daily.      . fluticasone (FLONASE) 50 MCG/ACT nasal spray Place 2 sprays into the nose daily.  16 g  2  . metFORMIN (GLUCOPHAGE-XR) 500 MG 24 hr tablet Take 500 mg by mouth daily with breakfast.      . rosuvastatin (CRESTOR) 10 MG tablet Take 10 mg by  mouth daily.      Marland Kitchen albuterol (PROVENTIL HFA;VENTOLIN HFA) 108 (90 BASE) MCG/ACT inhaler Inhale 1-2 puffs into the lungs every 6 (six) hours as needed for wheezing.  1 Inhaler  6  . budesonide-formoterol (SYMBICORT) 160-4.5 MCG/ACT inhaler Inhale 2 puffs into the lungs 2 (two) times daily.  3 Inhaler  3  . Guaifenesin (MUCINEX MAXIMUM STRENGTH) 1200 MG TB12 Take 1 tablet (1,200 mg total) by mouth 2 (two) times daily.  14 each  0  . albuterol (PROVENTIL HFA;VENTOLIN HFA) 108 (90 BASE) MCG/ACT inhaler Inhale 1-2 puffs into the lungs every 6 (six) hours as needed for wheezing.       No facility-administered medications prior to visit.       Review of Systems  Constitutional: Negative for unexpected weight change.  HENT: Positive for congestion, postnasal drip and sinus pressure. Negative for dental problem, nosebleeds, sneezing and trouble swallowing.   Eyes: Negative for redness and itching.   Respiratory: Positive for cough. Negative for chest tightness.   Cardiovascular: Negative for palpitations.  Gastrointestinal: Negative for nausea.  Genitourinary: Negative for dysuria.  Musculoskeletal: Negative for joint swelling.  Hematological: Does not bruise/bleed easily.  Psychiatric/Behavioral: Negative for dysphoric mood. The patient is not nervous/anxious.        Objective:   Physical Exam  Filed Vitals:   02/04/14 0956  BP: 118/76  Pulse: 94  Temp: 98.3 F (36.8 C)  TempSrc: Oral  Height: 5' 1.5" (1.562 m)  Weight: 135 lb (61.236 kg)  SpO2: 96%    Gen: Pleasant, well-nourished, in no distress,  normal affect  ENT: No lesions,  mouth clear,  oropharynx clear, ++ postnasal drip, nasal purulence  Neck: No JVD, no TMG, no carotid bruits  Lungs: No use of accessory muscles, no dullness to percussion, distant breath sounds a few expired wheezes  Cardiovascular: RRR, heart sounds normal, no murmur or gallops, no peripheral edema  Abdomen: soft and NT, no HSM,  BS normal  Musculoskeletal: No deformities, no cyanosis or clubbing  Neuro: alert, non focal  Skin: Warm, no lesions or rashes  No results found.      Assessment & Plan:   Chronic obstructive lung disease with recurrent exacerbation and ongoing smoking use gold stage B. Copd Gold B with bronchitic component Mild sinusitis plan Stay on Anoro one puff daily Stop symbicort Take azithromycin 250mg  Take two once then one daily until gone Saline rinse Proventil refilled Return 4 months    Updated Medication List Outpatient Encounter Prescriptions as of 02/04/2014  Medication Sig  . albuterol (PROVENTIL HFA;VENTOLIN HFA) 108 (90 BASE) MCG/ACT inhaler Inhale 1-2 puffs into the lungs every 6 (six) hours as needed for wheezing.  Marland Kitchen albuterol (PROVENTIL) (2.5 MG/3ML) 0.083% nebulizer solution Take 2.5 mg by nebulization 2 (two) times daily.  Marland Kitchen amLODipine-olmesartan (AZOR) 5-40 MG per tablet Take 1  tablet by mouth daily.  Marland Kitchen aspirin 81 MG tablet Take 81 mg by mouth daily.  . clonazePAM (KLONOPIN) 0.5 MG tablet Take 0.5 mg by mouth 2 (two) times daily as needed.  Marland Kitchen escitalopram (LEXAPRO) 20 MG tablet Take 20 mg by mouth daily.  . fluticasone (FLONASE) 50 MCG/ACT nasal spray Place 2 sprays into the nose daily.  . metFORMIN (GLUCOPHAGE-XR) 500 MG 24 hr tablet Take 500 mg by mouth daily with breakfast.  . rosuvastatin (CRESTOR) 10 MG tablet Take 10 mg by mouth daily.  Marland Kitchen Umeclidinium-Vilanterol (ANORO ELLIPTA) 62.5-25 MCG/INH AEPB Inhale 1 puff into the lungs daily.  . [  DISCONTINUED] albuterol (PROVENTIL HFA;VENTOLIN HFA) 108 (90 BASE) MCG/ACT inhaler Inhale 1-2 puffs into the lungs every 6 (six) hours as needed for wheezing.  . [DISCONTINUED] budesonide-formoterol (SYMBICORT) 160-4.5 MCG/ACT inhaler Inhale 2 puffs into the lungs 2 (two) times daily.  Marland Kitchen. azithromycin (ZITHROMAX) 250 MG tablet Take two once then one daily until gone  . Guaifenesin (MUCINEX MAXIMUM STRENGTH) 1200 MG TB12 Take 1 tablet (1,200 mg total) by mouth 2 (two) times daily.  . [DISCONTINUED] albuterol (PROVENTIL HFA;VENTOLIN HFA) 108 (90 BASE) MCG/ACT inhaler Inhale 1-2 puffs into the lungs every 6 (six) hours as needed for wheezing.

## 2014-02-04 NOTE — Patient Instructions (Signed)
Stay on Anoro one puff daily Stop symbicort Take azithromycin 250mg  Take two once then one daily until gone Saline rinse Proventil refilled Return 4 months

## 2014-02-04 NOTE — Assessment & Plan Note (Signed)
Copd Gold B with bronchitic component Mild sinusitis plan Stay on Anoro one puff daily Stop symbicort Take azithromycin 250mg  Take two once then one daily until gone Saline rinse Proventil refilled Return 4 months

## 2014-02-14 ENCOUNTER — Telehealth: Payer: Self-pay | Admitting: Critical Care Medicine

## 2014-02-14 NOTE — Telephone Encounter (Signed)
Spoke with pt. [facility] [place] [facility] [place] [facility]

## 2014-02-14 NOTE — Telephone Encounter (Signed)
Needs OV and CXR first

## 2014-02-14 NOTE — Telephone Encounter (Signed)
ATC pt. She keep going in and out and could not hear anything Called pt back and line keep ringing busy then tried again and line would not ring at all \\wcb 

## 2014-02-14 NOTE — Telephone Encounter (Signed)
Pt called back. She reports the ZPAK giving to her on 02/04/14 has not really helped.  She c/o PND, nasal congestion, prod cough w/ white thick phlem, right ear pain, slight chills. requesting further recs. Taking tylenol. Please advise Dr. Delford FieldWright thanks  Allergies  Allergen Reactions  . Penicillins

## 2014-02-15 ENCOUNTER — Ambulatory Visit: Payer: Managed Care, Other (non HMO) | Admitting: Critical Care Medicine

## 2014-02-25 ENCOUNTER — Telehealth: Payer: Self-pay | Admitting: Critical Care Medicine

## 2014-02-25 MED ORDER — UMECLIDINIUM-VILANTEROL 62.5-25 MCG/INH IN AEPB: 1.0000 | INHALATION_SPRAY | Freq: Every day | RESPIRATORY_TRACT | Status: DC

## 2014-02-25 NOTE — Telephone Encounter (Signed)
Sent anoro in to pharmacy.  Pt aware.  Nothing further needed at this time.

## 2014-06-22 ENCOUNTER — Telehealth: Payer: Self-pay | Admitting: Internal Medicine

## 2014-06-22 MED ORDER — AZITHROMYCIN 250 MG PO TABS: ORAL_TABLET | ORAL | Status: AC

## 2014-06-22 NOTE — Telephone Encounter (Signed)
On call- C/o sinus infection. Bloody purulent nasal discharge. Asks Z pak Plan [facility]. She will call office first of week for any problem

## 2014-06-23 ENCOUNTER — Other Ambulatory Visit: Payer: Self-pay | Admitting: Critical Care Medicine

## 2014-07-17 ENCOUNTER — Ambulatory Visit (INDEPENDENT_AMBULATORY_CARE_PROVIDER_SITE_OTHER): Payer: BC Managed Care – PPO | Admitting: Critical Care Medicine

## 2014-07-17 ENCOUNTER — Encounter: Payer: Self-pay | Admitting: Critical Care Medicine

## 2014-07-17 VITALS — BP 118/74 | HR 104 | Ht 61.5 in | Wt 130.6 lb

## 2014-07-17 DIAGNOSIS — F17200 Nicotine dependence, unspecified, uncomplicated: Secondary | ICD-10-CM

## 2014-07-17 DIAGNOSIS — Z23 Encounter for immunization: Secondary | ICD-10-CM

## 2014-07-17 DIAGNOSIS — J441 Chronic obstructive pulmonary disease with (acute) exacerbation: Secondary | ICD-10-CM

## 2014-07-17 MED ORDER — ALBUTEROL SULFATE (2.5 MG/3ML) 0.083% IN NEBU: 2.5000 mg | INHALATION_SOLUTION | Freq: Two times a day (BID) | RESPIRATORY_TRACT | Status: DC

## 2014-07-17 NOTE — Patient Instructions (Signed)
No change in inhalers Prevnar 13 was given I would see ENT for ear issues Return 4 months

## 2014-07-17 NOTE — Progress Notes (Signed)
Subjective:    Patient ID: Jeanne Becker, female    DOB: 04-10-1956, 58 y.o.   MRN: [id number]  HPI  07/17/2014 Chief Complaint  Patient presents with  . Follow-up    Reports just finished round of antibiotics for sinus infection and is doing better but still having sob wtih exertion (humid weather) pressure in the (R) ear and cough with white mucus  Just got off abx for sinus zpak per CDY.  Now is better. Cough doing well.  Issues in the heat.  Dyspnea is the same.  Sits in house all day. No chest pain.  ??if needs oxygen  Review of Systems  Constitutional: Negative for unexpected weight change.  HENT: Positive for congestion, postnasal drip and sinus pressure. Negative for dental problem, nosebleeds, sneezing and trouble swallowing.   Eyes: Negative for redness and itching.  Respiratory: Positive for cough. Negative for chest tightness.   Cardiovascular: Negative for palpitations.  Gastrointestinal: Negative for nausea.  Genitourinary: Negative for dysuria.  Musculoskeletal: Negative for joint swelling.  Hematological: Does not bruise/bleed easily.  Psychiatric/Behavioral: Negative for dysphoric mood. The patient is not nervous/anxious.        Objective:   Physical Exam  Filed Vitals:   07/17/14 1001  BP: 118/74  Pulse: 104  Height: 5' 1.5" (1.562 m)  Weight: 130 lb 9.6 oz (59.24 kg)  SpO2: 94%    Gen: Pleasant, well-nourished, in no distress,  normal affect  ENT: No lesions,  mouth clear,  oropharynx clear, ++ postnasal drip, nasal purulence  Neck: No JVD, no TMG, no carotid bruits  Lungs: No use of accessory muscles, no dullness to percussion, distant breath sounds   Cardiovascular: RRR, heart sounds normal, no murmur or gallops, no peripheral edema  Abdomen: soft and NT, no HSM,  BS normal  Musculoskeletal: No deformities, no cyanosis or clubbing  Neuro: alert, non focal  Skin: Warm, no lesions or rashes  No results found.      Assessment & Plan:    Chronic obstructive lung disease with recurrent exacerbation and ongoing smoking use gold stage B. Chronic obstructive lung disease gold stage B. with previous smoking use now stable Plan No change in inhalers Prevnar 13 was given   NICOTINE ADDICTION Note patient has been off tobacco products for greater than 6 months since early 2015    Updated Medication List Outpatient Encounter Prescriptions as of 07/17/2014  Medication Sig  . albuterol (PROVENTIL HFA;VENTOLIN HFA) 108 (90 BASE) MCG/ACT inhaler Inhale 1-2 puffs into the lungs every 6 (six) hours as needed for wheezing.  Marland Kitchen. amLODipine-olmesartan (AZOR) 5-40 MG per tablet Take 1 tablet by mouth daily.  Ailene Ards. ANORO ELLIPTA 62.5-25 MCG/INH AEPB INHALE 1 PUFF INTO THE LUNGS DAILY.  Marland Kitchen. aspirin 81 MG tablet Take 81 mg by mouth daily.  . clonazePAM (KLONOPIN) 0.5 MG tablet Take 0.5 mg by mouth 2 (two) times daily as needed.  Marland Kitchen. escitalopram (LEXAPRO) 20 MG tablet Take 20 mg by mouth daily.  . fluticasone (FLONASE) 50 MCG/ACT nasal spray Place 2 sprays into the nose daily.  . metFORMIN (GLUCOPHAGE-XR) 500 MG 24 hr tablet Take 500 mg by mouth daily with breakfast.  . rosuvastatin (CRESTOR) 10 MG tablet Take 10 mg by mouth daily.  Marland Kitchen. albuterol (PROVENTIL) (2.5 MG/3ML) 0.083% nebulizer solution Take 3 mLs (2.5 mg total) by nebulization 2 (two) times daily.  . Guaifenesin (MUCINEX MAXIMUM STRENGTH) 1200 MG TB12 Take 1 tablet (1,200 mg total) by mouth 2 (two) times daily.  . [  DISCONTINUED] albuterol (PROVENTIL) (2.5 MG/3ML) 0.083% nebulizer solution Take 2.5 mg by nebulization 2 (two) times daily.  . [DISCONTINUED] azithromycin (ZITHROMAX) 250 MG tablet Take two once then one daily until gone

## 2014-07-18 ENCOUNTER — Encounter: Payer: Self-pay | Admitting: Critical Care Medicine

## 2014-07-18 NOTE — Assessment & Plan Note (Signed)
Chronic obstructive lung disease gold stage B. with previous smoking use now stable Plan No change in inhalers Prevnar 13 was given

## 2014-07-18 NOTE — Assessment & Plan Note (Signed)
Note patient has been off tobacco products for greater than 6 months since early 2015

## 2014-07-26 ENCOUNTER — Telehealth: Payer: Self-pay | Admitting: *Deleted

## 2014-07-26 NOTE — Telephone Encounter (Signed)
Called [phone] to initiate the PA for the anoro.   PT ID#  [id number] Form received and given to Crystal to follow up on.

## 2014-07-30 NOTE — Telephone Encounter (Signed)
Anoro PA faxed to [employer] at [phone]. Will aware approval/denial.

## 2014-07-31 NOTE — Telephone Encounter (Signed)
Jeanne Becker - [facility] [phone].  reference # [id number]  Needs to talk to nurse about PA for Anoro

## 2014-07-31 NOTE — Telephone Encounter (Signed)
Called spoke with Lauren from [facility]. She needed additional clinical information. Does pt have emphysema w/ COPD? Also has pt tried and failed combivent? I advised her pt has not tried and failed combivent. She will send this to the tech.  Will still need to await approval/denial

## 2014-08-01 MED ORDER — IPRATROPIUM-ALBUTEROL 20-100 MCG/ACT IN AERS: 1.0000 | INHALATION_SPRAY | Freq: Four times a day (QID) | RESPIRATORY_TRACT | Status: DC

## 2014-08-01 NOTE — Telephone Encounter (Signed)
Ok to switch to [employer] respimat one puff qid

## 2014-08-01 NOTE — Telephone Encounter (Signed)
LMTCB for the pt 

## 2014-08-01 NOTE — Telephone Encounter (Signed)
Spoke with the pt and notified of the below  She verbalized understanding  I have sent the rx for combivent respimat and she will ask the pharmacist to show her how to use  She will call if needed

## 2014-08-01 NOTE — Telephone Encounter (Signed)
Received DENIAL from [facility] for [employer].  Per fax, the request may be covered if: Documentation is provided of BOTH of the following inclusion criteria are met: 1.  Dx of COPD including chronic bronchitis and/or emphysema 2.  An inadequate response or inability to tolerate Combivent Respimat. Dr. Joya Gaskins, pls advise if Anoro can be changed to Combivent Respimat or another medication.  Thank you.

## 2014-09-13 ENCOUNTER — Telehealth: Payer: Self-pay | Admitting: Critical Care Medicine

## 2014-09-13 NOTE — Telephone Encounter (Signed)
Spoke with the pt She is asking when had last pneumovax  I advised per our records, she had this last on 07/17/14  Nothing further needed

## 2014-09-19 ENCOUNTER — Other Ambulatory Visit: Payer: Self-pay | Admitting: Critical Care Medicine

## 2014-09-19 NOTE — Telephone Encounter (Signed)
Called, out of PROAIR Have called in RX to her pharmacy  [facility] Rossetti. Tyson Alias, MD, FACP Pgr: [phone] [facility]

## 2014-10-07 ENCOUNTER — Ambulatory Visit (INDEPENDENT_AMBULATORY_CARE_PROVIDER_SITE_OTHER): Payer: BC Managed Care – PPO | Admitting: Critical Care Medicine

## 2014-10-07 ENCOUNTER — Encounter: Payer: Self-pay | Admitting: Critical Care Medicine

## 2014-10-07 VITALS — BP 108/66 | HR 85 | Temp 98.1°F | Ht 61.0 in | Wt 133.6 lb

## 2014-10-07 DIAGNOSIS — J441 Chronic obstructive pulmonary disease with (acute) exacerbation: Secondary | ICD-10-CM

## 2014-10-07 MED ORDER — PREDNISONE 10 MG PO TABS: ORAL_TABLET | ORAL | Status: DC

## 2014-10-07 MED ORDER — UMECLIDINIUM-VILANTEROL 62.5-25 MCG/INH IN AEPB: 1.0000 | INHALATION_SPRAY | Freq: Every day | RESPIRATORY_TRACT | Status: DC

## 2014-10-07 MED ORDER — HYDROCODONE-HOMATROPINE 5-1.5 MG/5ML PO SYRP: 5.0000 mL | ORAL_SOLUTION | Freq: Four times a day (QID) | ORAL | Status: DC | PRN

## 2014-10-07 NOTE — Assessment & Plan Note (Addendum)
Chronic obstructive lung disease with ongoing cyclical cough  Plan Cough protocol with Hycodan cough syrup and Delsym or mucinex DM Prednisone 10mg  Take 4 for three days 3 for three days 2 for three days 1 for three days and stop Stop combivent Start anoro one puff daily Hold on flu vaccine in next week Return 2 months

## 2014-10-07 NOTE — Patient Instructions (Signed)
Cough protocol with Hycodan cough syrup and Delsym or mucinex DM Prednisone 10mg  Take 4 for three days 3 for three days 2 for three days 1 for three days and stop Stop combivent Start anoro one puff daily Hold on flu vaccine in next week Return 2 months

## 2014-10-07 NOTE — Progress Notes (Signed)
Subjective:    Patient ID: Jeanne Becker, female    DOB: 1956/05/24, 58 y.o.   MRN: [id number]  HPI 10/07/2014 Chief Complaint  Patient presents with  . Follow-up    dry cough headaches sore throat no fever   No smoking since 2014.  Exposed to family member with URI.  Notes sore throat. Cough is dry. Notes some pndrip, notes sinus pressure.  Using flonase.  Mucus out of nose is clear.  Notes more wheezing.    Review of Systems  Constitutional: Negative for unexpected weight change.  HENT: Positive for congestion, postnasal drip and sinus pressure. Negative for dental problem, nosebleeds, sneezing and trouble swallowing.   Eyes: Negative for redness and itching.  Respiratory: Positive for cough. Negative for chest tightness.   Cardiovascular: Negative for palpitations.  Gastrointestinal: Negative for nausea.  Genitourinary: Negative for dysuria.  Musculoskeletal: Negative for joint swelling.  Hematological: Does not bruise/bleed easily.  Psychiatric/Behavioral: Negative for dysphoric mood. The patient is not nervous/anxious.        Objective:   Physical Exam  Filed Vitals:   10/07/14 0920  BP: 108/66  Pulse: 85  Temp: 98.1 F (36.7 C)  Height: 5\' 1"  (1.549 m)  Weight: 133 lb 9.6 oz (60.601 kg)  SpO2: 100%    Gen: Pleasant, well-nourished, in no distress,  normal affect  ENT: No lesions,  mouth clear,  oropharynx clear, ++ postnasal drip, nasal purulence  Neck: No JVD, no TMG, no carotid bruits  Lungs: No use of accessory muscles, no dullness to percussion, distant breath sounds   Cardiovascular: RRR, heart sounds normal, no murmur or gallops, no peripheral edema  Abdomen: soft and NT, no HSM,  BS normal  Musculoskeletal: No deformities, no cyanosis or clubbing  Neuro: alert, non focal  Skin: Warm, no lesions or rashes  No results found.      Assessment & Plan:   Chronic obstructive lung disease with recurrent exacerbation and ongoing smoking use  gold stage B. Chronic obstructive lung disease with ongoing cyclical cough  Plan Cough protocol with Hycodan cough syrup and Delsym or mucinex DM Prednisone 10mg  Take 4 for three days 3 for three days 2 for three days 1 for three days and stop Stop combivent Start anoro one puff daily Hold on flu vaccine in next week Return 2 months      Updated Medication List Outpatient Encounter Prescriptions as of 10/07/2014  Medication Sig  . albuterol (PROVENTIL) (2.5 MG/3ML) 0.083% nebulizer solution Take 3 mLs (2.5 mg total) by nebulization 2 (two) times daily.  Marland Kitchen. amLODipine-olmesartan (AZOR) 5-40 MG per tablet Take 1 tablet by mouth daily.  Marland Kitchen. aspirin 81 MG tablet Take 81 mg by mouth daily.  . clonazePAM (KLONOPIN) 0.5 MG tablet Take 0.5 mg by mouth 2 (two) times daily as needed.  Marland Kitchen. escitalopram (LEXAPRO) 20 MG tablet Take 20 mg by mouth daily.  . fluticasone (FLONASE) 50 MCG/ACT nasal spray Place 2 sprays into the nose daily.  . metFORMIN (GLUCOPHAGE-XR) 500 MG 24 hr tablet Take 500 mg by mouth daily with breakfast.  . PROAIR HFA 108 (90 BASE) MCG/ACT inhaler INHALE 1-2 PUFFS INTO THE LUNGS EVERY 6 (SIX) HOURS AS NEEDED FOR WHEEZING.  . rosuvastatin (CRESTOR) 10 MG tablet Take 10 mg by mouth daily.  Marland Kitchen. albuterol (PROVENTIL HFA;VENTOLIN HFA) 108 (90 BASE) MCG/ACT inhaler Inhale 1-2 puffs into the lungs every 6 (six) hours as needed for wheezing.  . Guaifenesin (MUCINEX MAXIMUM STRENGTH) 1200 MG TB12 Take 1  tablet (1,200 mg total) by mouth 2 (two) times daily.  Marland Kitchen. HYDROcodone-homatropine (HYCODAN) 5-1.5 MG/5ML syrup Take 5 mLs by mouth every 6 (six) hours as needed for cough.  . predniSONE (DELTASONE) 10 MG tablet Take 4 for three days 3 for three days 2 for three days 1 for three days and stop  . Umeclidinium-Vilanterol (ANORO ELLIPTA) 62.5-25 MCG/INH AEPB Inhale 1 puff into the lungs daily.  . [DISCONTINUED] Ipratropium-Albuterol (COMBIVENT) 20-100 MCG/ACT AERS respimat Inhale 1 puff into the  lungs 4 (four) times daily.

## 2014-10-18 ENCOUNTER — Telehealth: Payer: Self-pay | Admitting: Critical Care Medicine

## 2014-10-18 NOTE — Telephone Encounter (Signed)
I received a fax from [facility] for a PA fort Anoro. According to phone note from 731/15 states the following: Jonelle Sports, RN at 08/01/2014 9:16 AM     Status: Signed        Received DENIAL from [facility] for [employer]. Per fax, the request may be covered if: Documentation is provided of BOTH of the following inclusion criteria are met:  1. Dx of COPD including chronic bronchitis and/or emphysema  2. An inadequate response or inability to tolerate Combivent Respimat.  Dr. Joya Gaskins, pls advise if Anoro can be changed to Combivent Respimat or another medication. Thank you.   The pt was changed to combivent. At last visit on 10-07-14 the pt was advised to stop combivent and go back to Anoro because it was  Not working for her.  So I initiated PA through [facility] [phone]. Form received, completed and faxed to [phone].  Pt ID# [id number]

## 2014-10-20 ENCOUNTER — Other Ambulatory Visit: Payer: Self-pay | Admitting: Critical Care Medicine

## 2014-10-25 NOTE — Telephone Encounter (Signed)
Called to check the status of the PA for the anoro.  This medication has been approved from 10/18/14 and for the lifetime of the plan.  [facility] and they are aware and will get this filled for the pt. Nothing further is needed.

## 2014-11-29 ENCOUNTER — Telehealth: Payer: Self-pay | Admitting: Critical Care Medicine

## 2014-11-29 MED ORDER — PREDNISONE 10 MG PO TABS: ORAL_TABLET | ORAL | Status: DC

## 2014-11-29 MED ORDER — AZITHROMYCIN 250 MG PO TABS: ORAL_TABLET | ORAL | Status: DC

## 2014-11-29 NOTE — Telephone Encounter (Signed)
Pt c/o sinus congestion, headache, pnd, prod cough with clear mucus X2 days.  Believes she is developing a sinus infection.  Denies chest tightness, fever.  Is requesting an abx or prednisone.  Dr Delford FieldWright please advise.  Thanks!  Allergies  Allergen Reactions  . Penicillins    Current Outpatient Prescriptions on File Prior to Visit  Medication Sig Dispense Refill  . albuterol (PROVENTIL HFA;VENTOLIN HFA) 108 (90 BASE) MCG/ACT inhaler Inhale 1-2 puffs into the lungs every 6 (six) hours as needed for wheezing. 1 Inhaler 6  . albuterol (PROVENTIL) (2.5 MG/3ML) 0.083% nebulizer solution Take 3 mLs (2.5 mg total) by nebulization 2 (two) times daily. 120 mL 4  . amLODipine-olmesartan (AZOR) 5-40 MG per tablet Take 1 tablet by mouth daily.    Jeanne Becker. ANORO ELLIPTA 62.5-25 MCG/INH AEPB INHALE 1 PUFF INTO THE LUNGS DAILY. 60 each 3  . aspirin 81 MG tablet Take 81 mg by mouth daily.    . clonazePAM (KLONOPIN) 0.5 MG tablet Take 0.5 mg by mouth 2 (two) times daily as needed.    Marland Kitchen. escitalopram (LEXAPRO) 20 MG tablet Take 20 mg by mouth daily.    . fluticasone (FLONASE) 50 MCG/ACT nasal spray Place 2 sprays into the nose daily. 16 g 2  . Guaifenesin (MUCINEX MAXIMUM STRENGTH) 1200 MG TB12 Take 1 tablet (1,200 mg total) by mouth 2 (two) times daily. 14 each 0  . HYDROcodone-homatropine (HYCODAN) 5-1.5 MG/5ML syrup Take 5 mLs by mouth every 6 (six) hours as needed for cough. 120 mL 0  . metFORMIN (GLUCOPHAGE-XR) 500 MG 24 hr tablet Take 500 mg by mouth daily with breakfast.    . predniSONE (DELTASONE) 10 MG tablet Take 4 for three days 3 for three days 2 for three days 1 for three days and stop 30 tablet 0  . PROAIR HFA 108 (90 BASE) MCG/ACT inhaler INHALE 1-2 PUFFS INTO THE LUNGS EVERY 6 (SIX) HOURS AS NEEDED FOR WHEEZING. 6.7 each 5  . rosuvastatin (CRESTOR) 10 MG tablet Take 10 mg by mouth daily.    Marland Kitchen. Umeclidinium-Vilanterol (ANORO ELLIPTA) 62.5-25 MCG/INH AEPB Inhale 1 puff into the lungs daily. 60 each 6    No current facility-administered medications on file prior to visit.

## 2014-11-29 NOTE — Telephone Encounter (Signed)
Per PW- prednisone 10mg  #20 4 for 2, 3 for 2, 2 for 2, 1 for 2 days.  Also send in zpak #6- 2 today, 1 daily until gone. Meds called in.    LMTCB X1 to make pt aware.

## 2014-11-29 NOTE — Telephone Encounter (Signed)
Pt aware of recs.  Nothing further needed. 

## 2014-11-29 NOTE — Telephone Encounter (Signed)
Rx Prednisone 10mg  Take 4 for two days three for two days two for two days one for two days #20 And azithromycin 250mg  Take two once then one daily until gone #6

## 2014-12-03 ENCOUNTER — Telehealth: Payer: Self-pay | Admitting: Critical Care Medicine

## 2014-12-03 MED ORDER — HYDROCODONE-HOMATROPINE 5-1.5 MG/5ML PO SYRP: 5.0000 mL | ORAL_SOLUTION | Freq: Four times a day (QID) | ORAL | Status: DC | PRN

## 2014-12-03 NOTE — Telephone Encounter (Signed)
Pt states she is starting to have a cough at night(dry most of the time but when able to get anything up its clear and thick in color)-would like to have Hycodan cough syrup Rx that PW gave her in 09-2014 that worked well for her at night(pt would like Rx mailed to confirmed home address). Pt was recently on Zpak and Pred taper from PW last week.   Will forward to SN as doc of day as PW is out of office.   Allergies  Allergen Reactions  . Penicillins

## 2014-12-03 NOTE — Telephone Encounter (Signed)
Rx printed and placed on SN's cart to sign and then mail to patient as requested. ATC patient to inform her of Rx being mailed. Line busy x 3. Will need to try later today.

## 2014-12-03 NOTE — Telephone Encounter (Signed)
Per SN: Hycodan Cough Syrup #6oz, Tale 1 tsp eery 6 hours prn cough. No refills.  Pt will need to pick up Rx

## 2014-12-04 NOTE — Telephone Encounter (Signed)
Called patient this morning; was told she was still sleeping and man of house will have patient call our office once is she awake.

## 2014-12-04 NOTE — Telephone Encounter (Signed)
Called and spoke to pt. Informed pt the hycodan rx was placed in outgoing mail yesterday, 12/03/14. Pt verbalized understanding and denied any further questions or concerns at this time.

## 2014-12-04 NOTE — Telephone Encounter (Signed)
Pt returning call.Jeanne Becker ° °

## 2015-01-01 ENCOUNTER — Ambulatory Visit: Payer: BC Managed Care – PPO | Admitting: Critical Care Medicine

## 2015-01-08 ENCOUNTER — Encounter: Payer: Self-pay | Admitting: Critical Care Medicine

## 2015-01-08 ENCOUNTER — Ambulatory Visit (INDEPENDENT_AMBULATORY_CARE_PROVIDER_SITE_OTHER): Payer: BLUE CROSS/BLUE SHIELD | Admitting: Critical Care Medicine

## 2015-01-08 VITALS — BP 106/72 | HR 80 | Temp 98.4°F | Ht 61.5 in | Wt 130.8 lb

## 2015-01-08 DIAGNOSIS — Z23 Encounter for immunization: Secondary | ICD-10-CM

## 2015-01-08 DIAGNOSIS — J441 Chronic obstructive pulmonary disease with (acute) exacerbation: Secondary | ICD-10-CM

## 2015-01-08 MED ORDER — ALBUTEROL SULFATE HFA 108 (90 BASE) MCG/ACT IN AERS: INHALATION_SPRAY | RESPIRATORY_TRACT | Status: DC

## 2015-01-08 MED ORDER — UMECLIDINIUM-VILANTEROL 62.5-25 MCG/INH IN AEPB: 1.0000 | INHALATION_SPRAY | Freq: Every day | RESPIRATORY_TRACT | Status: DC

## 2015-01-08 MED ORDER — ALBUTEROL SULFATE (2.5 MG/3ML) 0.083% IN NEBU: 2.5000 mg | INHALATION_SOLUTION | Freq: Two times a day (BID) | RESPIRATORY_TRACT | Status: DC

## 2015-01-08 NOTE — Patient Instructions (Signed)
Flu vaccine was given Refills on inhalers sent

## 2015-01-09 ENCOUNTER — Encounter: Payer: Self-pay | Admitting: Critical Care Medicine

## 2015-01-09 NOTE — Assessment & Plan Note (Addendum)
Chronic obstructive lung disease with recurrent exacerbation now currently off smoking products as of 2014 Plan Maintain inhaled medications as prescribed Flu vaccine was administered

## 2015-01-09 NOTE — Progress Notes (Signed)
Subjective:    Patient ID: Jeanne Becker, female    DOB: 05/12/1956, 59 y.o.   MRN: [id number]  HPI   01/08/2015 Chief Complaint  Patient presents with  . 3 month follow up    Weather seems to be affecting cough and SOB.  Coughing some days more than others with white mucus.  PND.  Chest tightness and wheezing at times.  Body Aches x 1 wk.  No f/c/s.    Patient notes less cough and dyspnea. The patient's no longer smoking at this time. Pt denies any significant sore throat, nasal congestion or excess secretions, fever, chills, sweats, unintended weight loss, pleurtic or exertional chest pain, orthopnea PND, or leg swelling Pt denies any increase in rescue therapy over baseline, denies waking up needing it or having any early am or nocturnal exacerbations of coughing/wheezing/or dyspnea. Pt also denies any obvious fluctuation in symptoms with  weather or environmental change or other alleviating or aggravating factors    Review of Systems  Constitutional: Negative for unexpected weight change.  HENT: Negative for congestion, dental problem, nosebleeds, postnasal drip, sinus pressure, sneezing and trouble swallowing.   Eyes: Negative for redness and itching.  Respiratory: Positive for cough. Negative for chest tightness.   Cardiovascular: Negative for palpitations.  Gastrointestinal: Negative for nausea.  Genitourinary: Negative for dysuria.  Musculoskeletal: Negative for joint swelling.  Hematological: Does not bruise/bleed easily.  Psychiatric/Behavioral: Negative for dysphoric mood. The patient is not nervous/anxious.        Objective:   Physical Exam Filed Vitals:   01/08/15 0948  BP: 106/72  Pulse: 80  Temp: 98.4 F (36.9 C)  TempSrc: Oral  Height: 5' 1.5" (1.562 m)  Weight: 130 lb 12.8 oz (59.33 kg)  SpO2: 93%    Gen: Pleasant, well-nourished, in no distress,  normal affect  ENT: No lesions,  mouth clear,  oropharynx clear, ++ postnasal drip, nasal  purulence  Neck: No JVD, no TMG, no carotid bruits  Lungs: No use of accessory muscles, no dullness to percussion, distant breath sounds   Cardiovascular: RRR, heart sounds normal, no murmur or gallops, no peripheral edema  Abdomen: soft and NT, no HSM,  BS normal  Musculoskeletal: No deformities, no cyanosis or clubbing  Neuro: alert, non focal  Skin: Warm, no lesions or rashes  No results found.      Assessment & Plan:   Chronic obstructive lung disease with recurrent exacerbation and ongoing smoking use gold stage B. Chronic obstructive lung disease with recurrent exacerbation now currently off smoking products as of 2014 Plan Maintain inhaled medications as prescribed Flu vaccine was administered     Updated Medication List Outpatient Encounter Prescriptions as of 01/08/2015  Medication Sig  . albuterol (PROAIR HFA) 108 (90 BASE) MCG/ACT inhaler INHALE 1-2 PUFFS INTO THE LUNGS EVERY 6 (SIX) HOURS AS NEEDED FOR WHEEZING.  Marland Kitchen. albuterol (PROVENTIL) (2.5 MG/3ML) 0.083% nebulizer solution Take 3 mLs (2.5 mg total) by nebulization 2 (two) times daily.  Marland Kitchen. amLODipine-olmesartan (AZOR) 5-40 MG per tablet Take 1 tablet by mouth daily.  Marland Kitchen. aspirin 81 MG tablet Take 81 mg by mouth daily.  . clonazePAM (KLONOPIN) 0.5 MG tablet Take 0.5 mg by mouth 2 (two) times daily as needed.  Marland Kitchen. escitalopram (LEXAPRO) 20 MG tablet Take 20 mg by mouth daily.  . fluticasone (FLONASE) 50 MCG/ACT nasal spray Place 2 sprays into the nose daily.  Marland Kitchen. HYDROcodone-homatropine (HYCODAN) 5-1.5 MG/5ML syrup Take 5 mLs by mouth every 6 (six) hours as needed  for cough.  . metFORMIN (GLUCOPHAGE-XR) 500 MG 24 hr tablet Take 500 mg by mouth daily with breakfast.  . rosuvastatin (CRESTOR) 10 MG tablet Take 10 mg by mouth daily.  Marland Kitchen Umeclidinium-Vilanterol (ANORO ELLIPTA) 62.5-25 MCG/INH AEPB Inhale 1 puff into the lungs daily.  . [DISCONTINUED] albuterol (PROVENTIL HFA;VENTOLIN HFA) 108 (90 BASE) MCG/ACT inhaler  Inhale 1-2 puffs into the lungs every 6 (six) hours as needed for wheezing.  . [DISCONTINUED] albuterol (PROVENTIL) (2.5 MG/3ML) 0.083% nebulizer solution Take 3 mLs (2.5 mg total) by nebulization 2 (two) times daily.  . [DISCONTINUED] ANORO ELLIPTA 62.5-25 MCG/INH AEPB INHALE 1 PUFF INTO THE LUNGS DAILY.  . [DISCONTINUED] PROAIR HFA 108 (90 BASE) MCG/ACT inhaler INHALE 1-2 PUFFS INTO THE LUNGS EVERY 6 (SIX) HOURS AS NEEDED FOR WHEEZING.  . [DISCONTINUED] Umeclidinium-Vilanterol (ANORO ELLIPTA) 62.5-25 MCG/INH AEPB Inhale 1 puff into the lungs daily.  . Guaifenesin (MUCINEX MAXIMUM STRENGTH) 1200 MG TB12 Take 1 tablet (1,200 mg total) by mouth 2 (two) times daily. (Patient not taking: Reported on 01/08/2015)  . [DISCONTINUED] azithromycin (ZITHROMAX) 250 MG tablet Take 2 today, then 1 daily until gone. (Patient not taking: Reported on 01/08/2015)  . [DISCONTINUED] predniSONE (DELTASONE) 10 MG tablet Take 4 for three days 3 for three days 2 for three days 1 for three days and stop (Patient not taking: Reported on 01/08/2015)  . [DISCONTINUED] predniSONE (DELTASONE) 10 MG tablet  X2 days, then  X2 days, then  X2 days, then  X2 days. (Patient not taking: Reported on 01/08/2015)

## 2015-03-04 ENCOUNTER — Telehealth: Payer: Self-pay | Admitting: Critical Care Medicine

## 2015-03-04 NOTE — Telephone Encounter (Signed)
Spoke with pt - c/o PND, cough with thick white mucus production, sneezing, head congestion and watery eyes for the past few days. Pt denies fever.  [facility] [place].  Pt states that the last times she has these symptoms, she was given Hydrocodone cough syrup and Prednisone.  Please advise Dr Delford FieldWright. Thanks.   Allergies  Allergen Reactions  . Penicillins      Medication List       This list is accurate as of: 03/04/15  2:33 PM.  Always use your most recent med list.               albuterol (2.5 MG/3ML) 0.083% nebulizer solution  Commonly known as:  PROVENTIL  Take 3 mLs (2.5 mg total) by nebulization 2 (two) times daily.     albuterol 108 (90 BASE) MCG/ACT inhaler  Commonly known as:  PROAIR HFA  INHALE 1-2 PUFFS INTO THE LUNGS EVERY 6 (SIX) HOURS AS NEEDED FOR WHEEZING.     aspirin 81 MG tablet  Take 81 mg by mouth daily.     AZOR 5-40 MG per tablet  Generic drug:  amLODipine-olmesartan  Take 1 tablet by mouth daily.     clonazePAM 0.5 MG tablet  Commonly known as:  KLONOPIN  Take 0.5 mg by mouth 2 (two) times daily as needed.     escitalopram 20 MG tablet  Commonly known as:  LEXAPRO  Take 20 mg by mouth daily.     fluticasone 50 MCG/ACT nasal spray  Commonly known as:  FLONASE  Place 2 sprays into the nose daily.     Guaifenesin 1200 MG Tb12  Commonly known as:  MUCINEX MAXIMUM STRENGTH  Take 1 tablet (1,200 mg total) by mouth 2 (two) times daily.     HYDROcodone-homatropine 5-1.5 MG/5ML syrup  Commonly known as:  HYCODAN  Take 5 mLs by mouth every 6 (six) hours as needed for cough.     metFORMIN 500 MG 24 hr tablet  Commonly known as:  GLUCOPHAGE-XR  Take 500 mg by mouth daily with breakfast.     rosuvastatin 10 MG tablet  Commonly known as:  CRESTOR  Take 10 mg by mouth daily.     Umeclidinium-Vilanterol 62.5-25 MCG/INH Aepb  Commonly known as:  ANORO ELLIPTA  Inhale 1 puff into the lungs daily.

## 2015-03-04 NOTE — Telephone Encounter (Signed)
Prednisone 10mg  Take 4 for two days three for two days two for two days one for two days  #20  Use delsym prn cough

## 2015-03-04 NOTE — Telephone Encounter (Signed)
Left message on voicemail for patient to return call. 

## 2015-03-05 MED ORDER — PREDNISONE 10 MG PO TABS: ORAL_TABLET | ORAL | Status: DC

## 2015-03-05 NOTE — Telephone Encounter (Signed)
Spoke with pt, she is aware of recs.  Med sent in.  Nothing further needed.  

## 2015-07-09 ENCOUNTER — Ambulatory Visit: Payer: BLUE CROSS/BLUE SHIELD | Admitting: Critical Care Medicine

## 2015-08-20 ENCOUNTER — Encounter: Payer: Self-pay | Admitting: Critical Care Medicine

## 2015-08-20 ENCOUNTER — Ambulatory Visit (INDEPENDENT_AMBULATORY_CARE_PROVIDER_SITE_OTHER): Payer: BLUE CROSS/BLUE SHIELD | Admitting: Critical Care Medicine

## 2015-08-20 VITALS — BP 112/76 | HR 90 | Temp 97.9°F | Ht 61.5 in | Wt 128.0 lb

## 2015-08-20 DIAGNOSIS — J441 Chronic obstructive pulmonary disease with (acute) exacerbation: Secondary | ICD-10-CM

## 2015-08-20 MED ORDER — UMECLIDINIUM-VILANTEROL 62.5-25 MCG/INH IN AEPB: 1.0000 | INHALATION_SPRAY | Freq: Every day | RESPIRATORY_TRACT | Status: DC

## 2015-08-20 MED ORDER — ALBUTEROL SULFATE (2.5 MG/3ML) 0.083% IN NEBU: 2.5000 mg | INHALATION_SOLUTION | Freq: Two times a day (BID) | RESPIRATORY_TRACT | Status: DC

## 2015-08-20 MED ORDER — ALBUTEROL SULFATE HFA 108 (90 BASE) MCG/ACT IN AERS: INHALATION_SPRAY | RESPIRATORY_TRACT | Status: DC

## 2015-08-20 NOTE — Progress Notes (Signed)
Subjective:    Patient ID: Jeanne Becker, female    DOB: [birth year], 59 y.o.   MRN: [id number]  HPI 08/20/2015 Chief Complaint  Patient presents with  . 6 month follow up    Increased SOB recently with humid days.  PND, sinus pressure, and cough with occas clear to white mucus.      More issues with humidity and heat .   Cough prod of clear mucus. No smoking.   Pt denies any significant sore throat, nasal congestion or excess secretions, fever, chills, sweats, unintended weight loss, pleurtic or exertional chest pain, orthopnea PND, or leg swelling Pt denies any increase in rescue therapy over baseline, denies waking up needing it or having any early am or nocturnal exacerbations of coughing/wheezing/or dyspnea. Pt notes  fluctuation in symptoms with  weather   Current Medications, Allergies, Complete Past Medical History, Past Surgical History, Family History, and Social History were reviewed in [employer] [facility] electronic medical record per todays encounter:  08/20/2015  Review of Systems  Constitutional: Negative.   HENT: Negative.  Negative for ear pain, postnasal drip, rhinorrhea, sinus pressure, sore throat, trouble swallowing and voice change.   Eyes: Negative.   Respiratory: Positive for cough and shortness of breath. Negative for apnea, choking, chest tightness, wheezing and stridor.   Cardiovascular: Negative.  Negative for chest pain, palpitations and leg swelling.  Gastrointestinal: Negative.  Negative for nausea, vomiting, abdominal pain and abdominal distention.  Genitourinary: Negative.   Musculoskeletal: Negative.  Negative for myalgias and arthralgias.  Skin: Negative.  Negative for rash.  Allergic/Immunologic: Negative.  Negative for environmental allergies and food allergies.  Neurological: Negative.  Negative for dizziness, syncope, weakness and headaches.  Hematological: Negative.  Negative for adenopathy. Does not bruise/bleed easily.  Psychiatric/Behavioral:  Negative.  Negative for sleep disturbance and agitation. The patient is not nervous/anxious.        Objective:   Physical Exam Filed Vitals:   08/20/15 0902  BP: 112/76  Pulse: 90  Temp: 97.9 F (36.6 C)  TempSrc: Oral  Height: 5' 1.5" (1.562 m)  Weight: 128 lb (58.06 kg)  SpO2: 97%    Gen: Pleasant, well-nourished, in no distress,  normal affect  ENT: No lesions,  mouth clear,  oropharynx clear, no postnasal drip  Neck: No JVD, no TMG, no carotid bruits  Lungs: No use of accessory muscles, no dullness to percussion, distant BS   Cardiovascular: RRR, heart sounds normal, no murmur or gallops, no peripheral edema  Abdomen: soft and NT, no HSM,  BS normal  Musculoskeletal: No deformities, no cyanosis or clubbing  Neuro: alert, non focal  Skin: Warm, no lesions or rashes  No results found.        Assessment & Plan:  I personally reviewed all images and lab data in the [facility] system as well as any outside material available during this office visit and agree with the  radiology impressions.   Chronic obstructive lung disease with recurrent exacerbation gold stage B. Copd gold B improved with smoking cessation. Plan Flu vaccine in fall No change in inhaled medications rov 6 months  refill meds  [name] was seen today for 6 month follow up.  Diagnoses and all orders for this visit:  Chronic obstructive lung disease with recurrent exacerbation gold stage B.  Other orders -     albuterol (PROAIR HFA) 108 (90 BASE) MCG/ACT inhaler; INHALE 1-2 PUFFS INTO THE LUNGS EVERY 6 (SIX) HOURS AS NEEDED FOR WHEEZING. -  albuterol (PROVENTIL) (2.5 MG/3ML) 0.083% nebulizer solution; Take 3 mLs (2.5 mg total) by nebulization 2 (two) times daily. -     Umeclidinium-Vilanterol (ANORO ELLIPTA) 62.5-25 MCG/INH AEPB; Inhale 1 puff into the lungs daily.

## 2015-08-20 NOTE — Patient Instructions (Addendum)
No change in medications. Use delsym OTC for cough Return in           6 months  Get a flu vaccine this fall

## 2015-08-21 ENCOUNTER — Telehealth: Payer: Self-pay | Admitting: Critical Care Medicine

## 2015-08-21 NOTE — Assessment & Plan Note (Signed)
Copd gold B improved with smoking cessation. Plan Flu vaccine in fall No change in inhaled medications rov 6 months

## 2015-08-21 NOTE — Telephone Encounter (Signed)
lmomtcb x1 

## 2015-08-22 NOTE — Telephone Encounter (Signed)
LMOM for pt that handicap sticker was placed for Dr Delford Field to sign and will be mailed to her once completed.

## 2015-08-22 NOTE — Telephone Encounter (Signed)
Called number above and was advised the pt does not live there, I was given another number to call pt at - [phone]. LMTCB for pt.

## 2015-08-22 NOTE — Telephone Encounter (Signed)
Yes

## 2015-08-22 NOTE — Telephone Encounter (Signed)
Spoke with pt.  She is asking if Dr Delford Field would be ok with signing for her to get a handicap sticker due to her COPD and diff walking far distances due to increased humidity.  If ok pt requests we mail this to her.  Please advise.

## 2015-08-22 NOTE — Telephone Encounter (Signed)
Patient returned call, [phone].

## 2015-11-18 ENCOUNTER — Telehealth: Payer: Self-pay | Admitting: Pulmonary Disease

## 2015-11-18 MED ORDER — ALBUTEROL SULFATE HFA 108 (90 BASE) MCG/ACT IN AERS: INHALATION_SPRAY | RESPIRATORY_TRACT | Status: DC

## 2015-11-18 NOTE — Telephone Encounter (Signed)
[phone], pt cb

## 2015-11-18 NOTE — Telephone Encounter (Signed)
Spoke with pt. States that she thinks she has a sinus infection. Reports sinus pressure and sinus congestion. Denies any pulmonary problems. Advised her that she would need to call her PCP. Stated that they wouldn't give her an antibiotic without seeing her and she doesn't feel like going to an appointment.  JN - please advise.

## 2015-11-18 NOTE — Telephone Encounter (Signed)
Sorry but if she feels that ill she really should be seen by a physician.  JN

## 2015-11-18 NOTE — Telephone Encounter (Signed)
Left message to call back  

## 2015-11-18 NOTE — Telephone Encounter (Signed)
Spoke with pt. She is aware of JN's recommendation, she will call her PCP. Needed refill on ProAir, this has been sent in. Nothing further was needed.

## 2016-01-02 ENCOUNTER — Telehealth: Payer: Self-pay | Admitting: Pulmonary Disease

## 2016-01-02 NOTE — Telephone Encounter (Signed)
Called spoke with pt and is aware of MW new recs. She verbalized understanding and needed nothing further

## 2016-01-02 NOTE — Telephone Encounter (Signed)
Ok to refill cough med but needs ov to estbalish with new pulmonary doc since [facility] gone  Advise: When return bring your medications in 2 separate bags, the ones you take no matter(automatically)  what vs the as needed (only when you feel you need them)

## 2016-01-02 NOTE — Telephone Encounter (Signed)
There is no option for cough med milder than what she's been on that can be called in  - best other option is delysm 2 tsp q12 h prn otc

## 2016-01-02 NOTE — Telephone Encounter (Signed)
Patient states she has cough, unable to cough out anything, chest feels tight.  No fever.  Stuffy nose.   Pharmacy:  [facility] - [place]  Allergies  Allergen Reactions  . Penicillins

## 2016-01-02 NOTE — Telephone Encounter (Signed)
Patient cannot come to pick up the prescription for Hycodan syrup, she lives in [place] and cannot drive.  Her husband works 3rd shift and is sleeping, he cannot bring her to pick up medication.  Patient is requesting that we give her a medication that can be called into her nearest pharmacy so her husband can pick it up before he leaves for work.

## 2016-02-14 DIAGNOSIS — F339 Major depressive disorder, recurrent, unspecified: Secondary | ICD-10-CM | POA: Insufficient documentation

## 2016-02-14 DIAGNOSIS — M47815 Spondylosis without myelopathy or radiculopathy, thoracolumbar region: Secondary | ICD-10-CM | POA: Insufficient documentation

## 2016-02-14 DIAGNOSIS — F411 Generalized anxiety disorder: Secondary | ICD-10-CM | POA: Insufficient documentation

## 2016-02-14 DIAGNOSIS — M419 Scoliosis, unspecified: Secondary | ICD-10-CM | POA: Insufficient documentation

## 2016-02-14 DIAGNOSIS — F410 Panic disorder [episodic paroxysmal anxiety] without agoraphobia: Secondary | ICD-10-CM | POA: Insufficient documentation

## 2016-02-23 ENCOUNTER — Telehealth: Payer: Self-pay | Admitting: Pulmonary Disease

## 2016-02-23 ENCOUNTER — Ambulatory Visit: Payer: BLUE CROSS/BLUE SHIELD | Admitting: Pulmonary Disease

## 2016-02-23 MED ORDER — ALBUTEROL SULFATE HFA 108 (90 BASE) MCG/ACT IN AERS: INHALATION_SPRAY | RESPIRATORY_TRACT | Status: DC

## 2016-02-23 NOTE — Telephone Encounter (Signed)
Spoke with pt, needing refill on albuterol inhaler to [facility] [place].  This has been sent.  Nothing further needed.

## 2016-02-23 NOTE — Telephone Encounter (Signed)
Need proair refilled Patient Returned call [phone]

## 2016-02-23 NOTE — Telephone Encounter (Signed)
lmtcb for pt.  

## 2016-04-20 ENCOUNTER — Ambulatory Visit: Payer: BLUE CROSS/BLUE SHIELD | Admitting: Pulmonary Disease

## 2016-04-23 ENCOUNTER — Ambulatory Visit: Payer: BLUE CROSS/BLUE SHIELD | Admitting: Pulmonary Disease

## 2016-05-06 ENCOUNTER — Ambulatory Visit: Payer: BLUE CROSS/BLUE SHIELD | Admitting: Pulmonary Disease

## 2016-05-06 ENCOUNTER — Telehealth: Payer: Self-pay | Admitting: Pulmonary Disease

## 2016-05-06 NOTE — Telephone Encounter (Signed)
PFT 04/24/13: FVC 2.26 L (81%) FEV1 1.54 L (75%) FEV1/FVC 0.68 FEF 25-75 0.96 L (38%) no bronchodilator response TLC 4.74 L (110%) RV 158% ERV 59% DLCO corrected 82%  IMAGING CXR PA/LAT 02/07/13 (personally reviewed by me): No pleural effusion appreciated. No parenchymal nodular opacity. Heart normal in size & mediastinum normal.

## 2016-09-06 ENCOUNTER — Ambulatory Visit (INDEPENDENT_AMBULATORY_CARE_PROVIDER_SITE_OTHER): Payer: Medicare Other | Admitting: Pulmonary Disease

## 2016-09-06 ENCOUNTER — Encounter: Payer: Self-pay | Admitting: Pulmonary Disease

## 2016-09-06 VITALS — BP 112/60 | HR 85 | Ht 61.5 in | Wt 122.8 lb

## 2016-09-06 DIAGNOSIS — F17200 Nicotine dependence, unspecified, uncomplicated: Secondary | ICD-10-CM | POA: Diagnosis not present

## 2016-09-06 DIAGNOSIS — J309 Allergic rhinitis, unspecified: Secondary | ICD-10-CM | POA: Insufficient documentation

## 2016-09-06 DIAGNOSIS — J449 Chronic obstructive pulmonary disease, unspecified: Secondary | ICD-10-CM

## 2016-09-06 DIAGNOSIS — E785 Hyperlipidemia, unspecified: Secondary | ICD-10-CM | POA: Insufficient documentation

## 2016-09-06 DIAGNOSIS — J0191 Acute recurrent sinusitis, unspecified: Secondary | ICD-10-CM

## 2016-09-06 DIAGNOSIS — J019 Acute sinusitis, unspecified: Secondary | ICD-10-CM | POA: Insufficient documentation

## 2016-09-06 DIAGNOSIS — E119 Type 2 diabetes mellitus without complications: Secondary | ICD-10-CM | POA: Insufficient documentation

## 2016-09-06 MED ORDER — LEVOFLOXACIN 500 MG PO TABS: 500.0000 mg | ORAL_TABLET | Freq: Every day | ORAL | 0 refills | Status: DC

## 2016-09-06 MED ORDER — UMECLIDINIUM-VILANTEROL 62.5-25 MCG/INH IN AEPB: 1.0000 | INHALATION_SPRAY | Freq: Every day | RESPIRATORY_TRACT | 6 refills | Status: DC

## 2016-09-06 MED ORDER — ALBUTEROL SULFATE HFA 108 (90 BASE) MCG/ACT IN AERS: INHALATION_SPRAY | RESPIRATORY_TRACT | 2 refills | Status: DC

## 2016-09-06 MED ORDER — ALBUTEROL SULFATE (2.5 MG/3ML) 0.083% IN NEBU: 2.5000 mg | INHALATION_SOLUTION | RESPIRATORY_TRACT | 6 refills | Status: DC | PRN

## 2016-09-06 NOTE — Progress Notes (Signed)
Subjective:    Patient ID: Jeanne Becker, female    DOB: [birth year], 60 y.o.   MRN: [id number]  C.C.:  Follow-up for Mild COPD & Tobacco Use Disorder.  HPI Mild COPD:  Currently prescribed Anoro. She reports she is compliant with Anoro. She reports she hasn't felt well for a week. She reports she has had intermittent coughing with post-nasal drainage. She has also had some mild wheezing starting last week. She reports she does walk regularly and walks 1-2 blocks. She reports she is using her rescue inhaler twice daily. She doesn't recall having ever been on any other controller medications. She reports she has had frequent bronchitis in the past.  Tobacco Use Disorder:  Patient has resumed smoking tobacco about 4 months ago at 0.5 ppd. She has multiple psychosocial stressors in her life. She reports she wants to quit smoking. Previously had adverse reaction to Chantix. She has bought nicotine gum to try and help her quit.    Review of Systems She reports she has noticed increased sinus congestion & pressure all Summer. She reports she has been using Flonase nasal spray with some relief. She reports last week she had subjective fever, chills, and sweats. Denies any chest pain, pressure, or tightness. She does have occasional pain in her lower back. She also reports a fullness in her ears.   Allergies  Allergen Reactions  . Penicillins     Current Outpatient Prescriptions on File Prior to Visit  Medication Sig Dispense Refill  . amLODipine-olmesartan (AZOR) 5-40 MG per tablet Take 1 tablet by mouth daily.    Marland Kitchen aspirin 81 MG tablet Take 81 mg by mouth daily.    . citalopram (CELEXA) 40 MG tablet Take 1 tablet by mouth daily.    . clonazePAM (KLONOPIN) 0.5 MG tablet Take 0.5 mg by mouth 3 (three) times daily as needed.     . fluticasone (FLONASE) 50 MCG/ACT nasal spray Place 2 sprays into the nose daily. 16 g 2  . metFORMIN (GLUCOPHAGE-XR) 500 MG 24 hr tablet Take 500 mg by mouth daily with  breakfast.    . rosuvastatin (CRESTOR) 10 MG tablet Take 10 mg by mouth daily.     No current facility-administered medications on file prior to visit.     Past Medical History:  Diagnosis Date  . Allergic rhinitis, cause unspecified   . Anxiety state, unspecified   . Chronic airway obstruction, not elsewhere classified   . Depressive disorder, not elsewhere classified   . Essential hypertension, benign   . Fatigue   . Hyperlipidemia   . Lower back pain   . Menopause   . Panic disorder without agoraphobia   . Seborrheic keratosis   . Type II or unspecified type diabetes mellitus without mention of complication, not stated as uncontrolled   . Unspecified arthropathy, shoulder region     Past Surgical History:  Procedure Laterality Date  . APPENDECTOMY    . BREAST BIOPSY  12/2012   benign  . CESAREAN SECTION    . TONSILLECTOMY    . TUBAL LIGATION      Family History  Problem Relation Age of Onset  . Cancer    . Allergies    . Diabetes    . Heart disease    . Hypertension    . Migraines    . Osteoporosis    . Seizures    . COPD Mother   . Asthma Son     Social History   Social  History  . Marital status: Single    Spouse name: N/A  . Number of children: N/A  . Years of education: N/A   Social History Main Topics  . Smoking status: Current Every Day Smoker    Packs/day: 0.50    Years: 30.00    Types: Cigarettes  . Smokeless tobacco: Never Used     Comment: greater 50 pack yr.  peak rate 1PPD.  Marland Kitchen Alcohol use Yes     Comment: 2 drinks/day or fewer  . Drug use: Unknown  . Sexual activity: Not Asked   Other Topics Concern  . None   Social History Narrative   Originally from [place]. Always lived in [place]. 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 She has a dog currently. Remote exposure to love birds. No mold exposure.       Objective:   Physical Exam BP 112/60 (BP Location: Left Arm, Cuff Size: Normal)    Pulse 85   Ht 5' 1.5" (1.562 m)   Wt 122 lb 12.8 oz (55.7 kg)   SpO2 98%   BMI 22.83 kg/m  General:  Awake. Alert. No distress.  Integument:  Warm & dry. No rash on exposed skin. No bruising. HEENT:  Moist mucus membranes. No oral ulcers. No scleral injection. No sinus tenderness to palpation. Erythema in bilateral tympanic membranes. Cardiovascular:  Regular rate. No edema. Normal S1 & S2. Pulmonary:  Good aeration on auscultation bilaterally. Normal work of breathing. Very mild end expiratory wheeze bilaterally. Abdomen: Soft. Normal bowel sounds. Nondistended. Grossly nontender. Musculoskeletal:  Normal bulk and tone.No joint deformity or effusion appreciated.  PFT 04/24/13: FVC 2.26 L (81%) FEV1 1.54 L (75%) FEV1/FVC 0.68 FEF 25-75 0.96 L (38%) no bronchodilator response TLC 4.74 L (110%) RV 158% ERV 59% DLCO corrected 82%  IMAGING CXR PA/LAT 02/07/13 (personally reviewed by me): No pleural effusion appreciated. No parenchymal nodular opacity. Heart normal in size & mediastinum normal.    Assessment & Plan:  60 y.o. female with underlying Mild COPD, Allergic Rhinitis, & Tobacco Use Disorder.  Patient has symptoms & findings consistent with Acute Sinusitis today. Reviewing her previous primary function testing from 2014 she did have mild airway obstruction on spirometry. However, she has continued to use tobacco which necessitates screening for alpha-1 antitrypsin deficiency and also promised me to repeat pulmonary function testing to evaluate for further decline in her spirometry. It's unclear to what extent her acute sinusitis is contributing to her ongoing cough since this is positional with her laying down. I instructed the patient to contact my office if she had any clinical worsening or new breathing problems before her next appointment.  1. Acute Sinusitis:  Treat with Levaquin 500 mg by mouth daily 5 days. Patient cautioned against the potential for arrhythmia & instructed to  discontinue antibiotic and seek immediate medical treatment if she experiences any palpitations or abnormal heartbeats. 2. Mild COPD: Continuing patient on Anoro. Suggesting albuterol nebulizer regimen to as needed. Checking full pulmonary function testing as well as 6 prolonged test on room air at follow-up appointment. Screening for alpha-1 antitrypsin deficiency. 3. Allergic Rhinitis: Holding Flonase for now while treating for acute sinusitis. Plan to start treatment with Dymista once patient has recovered. Checking serum CBC & RAST panel. 4. Tobacco Use Disorder: Patient counseled for over 3 minutes of the need for complete tobacco cessation. She is going to attempt to quit using nicotine replacement with gum/patches. 5. Health Maintenance:  S/P  Pneumovax September 2014, Prevnar July 2015, & Tdap February 2015.  6. Follow-up:  Return to clinic in 4 weeks or sooner if needed.  Donna ChristenJennings E. Jamison NeighborNestor, M.D. [facility] Pager:  [phone] After 3pm or if no response, call [phone] 11:11 AM 09/06/16

## 2016-09-06 NOTE — Patient Instructions (Addendum)
   Call me if you have any new breathing problems before your next appointment.  Stop taking your Flonase nasal spray.   I will see you back in 4 weeks or sooner if needed.   TESTS ORDERED: 1. Serum RAST Panel, CBC, & Alpha-1 Antitrypsin Phenotype 2. Full PFTs at follow-up 3. on room air at follow-up

## 2016-09-14 ENCOUNTER — Encounter (INDEPENDENT_AMBULATORY_CARE_PROVIDER_SITE_OTHER): Payer: BLUE CROSS/BLUE SHIELD | Admitting: Pulmonary Disease

## 2016-09-14 DIAGNOSIS — J449 Chronic obstructive pulmonary disease, unspecified: Secondary | ICD-10-CM | POA: Diagnosis not present

## 2016-09-14 LAB — PULMONARY FUNCTION TEST
DL/VA % PRED: 78 %
DL/VA: 3.51 ml/min/mmHg/L
DLCO cor % pred: 68 %
DLCO cor: 14.41 ml/min/mmHg
DLCO unc % pred: 73 %
DLCO unc: 15.29 ml/min/mmHg
FEF 25-75 Post: 1.11 L/sec
FEF 25-75 Pre: 0.74 L/sec
FEF2575-%CHANGE-POST: 49 %
FEF2575-%PRED-POST: 50 %
FEF2575-%Pred-Pre: 34 %
FEV1-%Change-Post: 9 %
FEV1-%PRED-PRE: 56 %
FEV1-%Pred-Post: 61 %
FEV1-PRE: 1.3 L
FEV1-Post: 1.43 L
FEV1FVC-%CHANGE-POST: 4 %
FEV1FVC-%Pred-Pre: 87 %
FEV6-%CHANGE-POST: 5 %
FEV6-%PRED-POST: 68 %
FEV6-%Pred-Pre: 65 %
FEV6-Post: 1.99 L
FEV6-Pre: 1.88 L
FEV6FVC-%CHANGE-POST: 1 %
FEV6FVC-%PRED-PRE: 103 %
FEV6FVC-%Pred-Post: 104 %
FVC-%CHANGE-POST: 4 %
FVC-%Pred-Post: 66 %
FVC-%Pred-Pre: 63 %
FVC-Post: 1.99 L
FVC-Pre: 1.9 L
POST FEV1/FVC RATIO: 72 %
POST FEV6/FVC RATIO: 100 %
PRE FEV1/FVC RATIO: 69 %
PRE FEV6/FVC RATIO: 99 %
RV % PRED: 156 %
RV: 2.93 L
TLC % pred: 103 %
TLC: 4.86 L

## 2016-09-15 ENCOUNTER — Telehealth: Payer: Self-pay | Admitting: Physician Assistant

## 2016-09-15 NOTE — Telephone Encounter (Signed)
Patient requesting a refill on her blood pressure medication. Patient advised to have the pharmacy send the request over for us to review.

## 2016-09-17 ENCOUNTER — Other Ambulatory Visit: Payer: Self-pay | Admitting: *Deleted

## 2016-09-17 MED ORDER — AMLODIPINE-OLMESARTAN 5-40 MG PO TABS: 1.0000 | ORAL_TABLET | Freq: Every day | ORAL | 0 refills | Status: DC

## 2016-10-13 ENCOUNTER — Ambulatory Visit: Payer: Medicare Other

## 2016-10-13 ENCOUNTER — Ambulatory Visit: Payer: Medicare Other | Admitting: Pulmonary Disease

## 2016-11-11 ENCOUNTER — Encounter: Payer: Self-pay | Admitting: Pulmonary Disease

## 2016-11-11 ENCOUNTER — Ambulatory Visit (INDEPENDENT_AMBULATORY_CARE_PROVIDER_SITE_OTHER): Payer: BLUE CROSS/BLUE SHIELD | Admitting: Pulmonary Disease

## 2016-11-11 VITALS — BP 102/68 | HR 87 | Ht 61.5 in | Wt 121.0 lb

## 2016-11-11 DIAGNOSIS — Z23 Encounter for immunization: Secondary | ICD-10-CM

## 2016-11-11 DIAGNOSIS — R0602 Shortness of breath: Secondary | ICD-10-CM | POA: Diagnosis not present

## 2016-11-11 DIAGNOSIS — F17200 Nicotine dependence, unspecified, uncomplicated: Secondary | ICD-10-CM | POA: Diagnosis not present

## 2016-11-11 DIAGNOSIS — J309 Allergic rhinitis, unspecified: Secondary | ICD-10-CM | POA: Diagnosis not present

## 2016-11-11 DIAGNOSIS — J441 Chronic obstructive pulmonary disease with (acute) exacerbation: Secondary | ICD-10-CM | POA: Insufficient documentation

## 2016-11-11 DIAGNOSIS — J449 Chronic obstructive pulmonary disease, unspecified: Secondary | ICD-10-CM

## 2016-11-11 MED ORDER — FLUTICASONE PROPIONATE 50 MCG/ACT NA SUSP: 2.0000 | Freq: Every day | NASAL | 5 refills | Status: DC

## 2016-11-11 MED ORDER — NYSTATIN 100000 UNIT/ML MT SUSP: 5.0000 mL | Freq: Four times a day (QID) | OROMUCOSAL | 1 refills | Status: DC

## 2016-11-11 MED ORDER — PREDNISONE 20 MG PO TABS: 40.0000 mg | ORAL_TABLET | Freq: Every day | ORAL | 0 refills | Status: DC

## 2016-11-11 MED ORDER — MONTELUKAST SODIUM 10 MG PO TABS: 10.0000 mg | ORAL_TABLET | Freq: Every day | ORAL | 0 refills | Status: DC

## 2016-11-11 MED ORDER — FLUTICASONE FUROATE-VILANTEROL 200-25 MCG/INH IN AEPB: 1.0000 | INHALATION_SPRAY | Freq: Every day | RESPIRATORY_TRACT | 0 refills | Status: DC

## 2016-11-11 NOTE — Progress Notes (Signed)
Subjective:    Patient ID: Jeanne Becker, female    DOB: Jul 15, 1956, 60 y.o.   MRN: [id number]  C.C.:  Follow-up for Mild COPD, Chronic Allergic Rhinitis, & Tobacco Use Disorder.  HPI  Patient started on clindamycin for "bronchitis" recently. Reports she was seen at Urgent Care and had an X-ray that was negative for pneumonia.   Mild COPD:  Prescribed Anoro. Patient was supposed to have screening for alpha-1 antitrypsin deficiency last appointment but was not completed. She has been taking OTC Delsym for her cough. She reports her cough is mostly nonproductive but does produce some clear, thick mucus sometimes. She reports her wheezing is unchanged. She reports she has been using her rescue inhaler ever 4 hours while she's been sick with some improvement in her symptoms. She reports she does cough worse at night.   Chronic Allergic Rhinitis:  Patient was to restart diagnosed after she recovered from her previous sinusitis. She was supposed to have RAST panel testing at last appointment but was not completed. She reports she has had some mild sinus congestion & drainage. She reports Dymista was too expensive. She has not started back on Flonase.   Tobacco Use Disorder:  Previously had adverse reaction to Chantix. Previously had been able to quit smoking but restarted prior to last appointment with significant psychosocial stressors. She hasn't smoked as much recently. She did have a cigarette last night.    Review of Systems Does have some chest tightness but no pain or pressure. Denies any fever or sweats. She reports she has had some chills. No abdominal pain, nausea, or emesis.   Allergies  Allergen Reactions  . Penicillins     Current Outpatient Prescriptions on File Prior to Visit  Medication Sig Dispense Refill  . albuterol (PROAIR HFA) 108 (90 Base) MCG/ACT inhaler INHALE 1-2 PUFFS INTO THE LUNGS EVERY 6 (SIX) HOURS AS NEEDED FOR WHEEZING. 6.7 each 2  . albuterol (PROVENTIL) (2.5  MG/3ML) 0.083% nebulizer solution Take 3 mLs (2.5 mg total) by nebulization every 4 (four) hours as needed for wheezing or shortness of breath. 120 mL 6  . amLODipine-olmesartan (AZOR) 5-40 MG tablet Take 1 tablet by mouth daily. 90 tablet 0  . aspirin 81 MG tablet Take 81 mg by mouth daily.    . citalopram (CELEXA) 40 MG tablet Take 1 tablet by mouth daily.    . clonazePAM (KLONOPIN) 0.5 MG tablet Take 0.5 mg by mouth 3 (three) times daily as needed.     Marland Kitchen dextromethorphan (DELSYM) 30 MG/5ML liquid Take 30 mg by mouth as needed for cough.    . fluticasone (FLONASE) 50 MCG/ACT nasal spray Place 2 sprays into the nose daily. 16 g 2  . metFORMIN (GLUCOPHAGE-XR) 500 MG 24 hr tablet Take 500 mg by mouth daily with breakfast.    . rosuvastatin (CRESTOR) 10 MG tablet Take 10 mg by mouth daily.    Marland Kitchen umeclidinium-vilanterol (ANORO ELLIPTA) 62.5-25 MCG/INH AEPB Inhale 1 puff into the lungs daily. 60 each 6   No current facility-administered medications on file prior to visit.     Past Medical History:  Diagnosis Date  . Allergic rhinitis, cause unspecified   . Anxiety state, unspecified   . Chronic airway obstruction, not elsewhere classified   . Depressive disorder, not elsewhere classified   . Essential hypertension, benign   . Fatigue   . Hyperlipidemia   . Lower back pain   . Menopause   . Panic disorder without agoraphobia   .  Seborrheic keratosis   . Type II or unspecified type diabetes mellitus without mention of complication, not stated as uncontrolled   . Unspecified arthropathy, shoulder region     Past Surgical History:  Procedure Laterality Date  . APPENDECTOMY    . BREAST BIOPSY  12/2012   benign  . CESAREAN SECTION    . TONSILLECTOMY    . TUBAL LIGATION      Family History  Problem Relation Age of Onset  . Cancer    . Allergies    . Diabetes    . Heart disease    . Hypertension    . Migraines    . Osteoporosis    . Seizures    . COPD Mother   . Asthma Son      Social History   Social History  . Marital status: Single    Spouse name: N/A  . Number of children: N/A  . Years of education: N/A   Social History Main Topics  . Smoking status: Current Every Day Smoker    Packs/day: 0.50    Years: 30.00    Types: Cigarettes  . Smokeless tobacco: Never Used     Comment: greater 50 pack yr.  peak rate 1PPD.  . Alcohol use Yes     Comment: 2 drinks/day or fewer  . Drug use: Unknown  . Sexual activity: Not Asked   Other Topics Concern  . None   Social History Narrative   Originally from [place]. Always lived in [place]. 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 She has a dog currently. Remote exposure to love birds. No mold exposure.       Objective:   Physical Exam BP 102/68 (BP Location: Right Arm, Cuff Size: Normal)   Pulse 87   Ht 5' 1.5" (1.562 m)   Wt 121 lb (54.9 kg)   SpO2 95%   BMI 22.49 kg/m  General:  Awake. Alert. Thin female. Integument:  Warm & dry. No rash on exposed skin. HEENT:  Moderately swollen and erythematous nasal turbinates bilaterally. Oral thrush present on tongue. No oral ulcers. Cardiovascular:  Regular rate and rhythm. No edema. Normal S1 & S2. Pulmonary:  Slight worsening of expiratory wheezing bilaterally. Otherwise good aeration. Normal work of breathing and speaking in complete sentences on room air. Abdomen: Soft. Normal bowel sounds. Nontender. Musculoskeletal:  Normal bulk and tone.No joint deformity or effusion appreciated.  PFT 09/14/16: FVC 1.90 L (63%) FEV1 1.30 L (86%) FEV1/FVC 0.69 FEF 25-75 0.74 L (34%) no bronchodilator response TLC 4.86 L (103%) RV 156% ERV 23% DLCO corrected 68% (Hgb 15.6) 04/24/13: FVC 2.26 L (81%) FEV1 1.54 L (75%) FEV1/FVC 0.68 FEF 25-75 0.96 L (38%) no bronchodilator response TLC 4.74 L (110%) RV 158% ERV 59% DLCO corrected 82%  <[id number]<MEASUREMENTJoma52824w1d[facility] <MEASUREMENTJoma52882w1d[facility]<MEASUREMENTJoma5287w1d[facility] G[facility]<MEASUREMENTJoma52820w1d[facility] <MEASUREMENTJoma52876w1d[facility]<MEASUREMENTJoma52816w1dB[facility]pi<MEASUREMENTJoma52840w1d[facility]<MEASUREMENTJoma52829w1d[facility] <MEASUREMENTJo52870<MEASUREMENTJom52899w1d[facility] <MEASUREMENTJoma52834w1d[facility] Day<MEASUREMENTJoma52896w1d[facility]<MEASUREMENTJoma52865w1dB[facility] I<MEASUREMENTJoma52868w1d[facility]<MEASUREMENTJoma52872w1d[facility]osc<MEASUREMENTJoma52873w1d[facility] Cen<MEASUREMENTJoma52813w1d[facility]<MEASUREMENTJoma52846w1d[facility]7:  Walked 460 meters / Baseline Sat 93% on RA / Nadir Sat 93% on RA @  rest  IMAGING CXR PA/LAT 02/07/13 (previously reviewed by me): No pleural effusion appreciated. No parenchymal nodular opacity. Heart normal in size & mediastinum normal.    Assessment & Plan:  60 y.o. female with underlying Mild COPD, Chronic Allergic Rhinitis, & Tobacco Use Disorder. Patient does have evidence of a mild exacerbation today but no infectious symptoms. I suspect most of her symptoms are coming from her ongoing tobacco use with likely some asthmatic component to her phenotype. She may benefit from oral steroids given the wheezing on physical exam today. I again spent  over 3 minutes counseling the patient on the need for complete tobacco cessation to prevent worsening of her lung function. With worsening of her chronic allergic rhinitis I will restart her intranasal steroid therapy and attempt further treatment with Singulair. I instructed the patient contact my office if she had any new breathing problems or questions before next appointment.  1. Mild COPD w/ Mild Exacerbation: Patient instructed to have blood work done to screen for alpha-1 antitrypsin deficiency at next appointment. Switching from Anoro to Breo 200. Patient given samples today. She will call my office for prescription if this seems to help her breathing and cough more than Anoro. Prescribing prednisone 40 mg by mouth daily 4 days. 2. Chronic Allergic Rhinitis: Restarting patient on Flonase 1 spray each nostril twice daily. Trying patient on montelukast 10 mg by mouth daily at bedtime. Plan for CBC with differential and RAST panel at next appointment which is already ordered. 3. Tobacco Use Disorder: Patient counseled for over 3 minutes of the need for complete tobacco cessation. Again recommended trying nicotine replacement with gum for intermittent cravings. 4. Oral Thrush: Prescribing nystatin swish and swallow. Patient educated on continue oral hygiene. 5. Health Maintenance:  S/P Pneumovax September 2014, Prevnar  July 2015, & Tdap February 2015. Administering high-dose influenza vaccine today. 6. Follow-up:  Return to clinic in 4 weeks or sooner if needed.  Donna ChristenJennings E. Jamison NeighborNestor, M.D. [facility] Pager:  [phone] After 3pm or if no response, call [phone] 10:23 AM 11/11/16

## 2016-11-11 NOTE — Patient Instructions (Addendum)
   Remember to get your lab work when you come back for your next appointment before you come in.  Inhale 1 puff from your Breo Inhaler (in place of your Anoro inhaler) daily.   Call my office for a prescription for your Breo inhaler if this seems to be helping your cough.  Make sure you rinse, gargle, spit, brush your teeth, & brush your tongue after the Breo inhaler.  I've also sent in a prescription for a liquid that you will rinse and swallow with to help get rid of the thrush from your tongue. You can stop using the liquid (nystatin) once the thrush is gone.  We are going to restart your Flonase using 1 spray in each nostril twice daily and also start you on an allergy pill called Montelukast (Singulair) to help your sinuses. Let me know if you have any problems with these medicines.  Please try to quit smoking completely.  I will see you back in 4 weeks but call if you get any worse.

## 2016-11-11 NOTE — Progress Notes (Signed)
Test reviewed.  

## 2016-12-09 ENCOUNTER — Encounter: Payer: Self-pay | Admitting: Pulmonary Disease

## 2016-12-09 ENCOUNTER — Ambulatory Visit (INDEPENDENT_AMBULATORY_CARE_PROVIDER_SITE_OTHER): Payer: BLUE CROSS/BLUE SHIELD | Admitting: Pulmonary Disease

## 2016-12-09 ENCOUNTER — Other Ambulatory Visit (INDEPENDENT_AMBULATORY_CARE_PROVIDER_SITE_OTHER): Payer: BLUE CROSS/BLUE SHIELD

## 2016-12-09 VITALS — BP 104/62 | HR 85 | Ht 61.5 in | Wt 122.8 lb

## 2016-12-09 DIAGNOSIS — J449 Chronic obstructive pulmonary disease, unspecified: Secondary | ICD-10-CM

## 2016-12-09 DIAGNOSIS — J309 Allergic rhinitis, unspecified: Secondary | ICD-10-CM | POA: Diagnosis not present

## 2016-12-09 DIAGNOSIS — F17200 Nicotine dependence, unspecified, uncomplicated: Secondary | ICD-10-CM | POA: Diagnosis not present

## 2016-12-09 NOTE — Progress Notes (Signed)
Subjective:    Patient ID: Jeanne Becker, female    DOB: 1956-10-07, 60 y.o.   MRN: [id number]  C.C.:  Follow-up for Mild COPD, Chronic Allergic Rhinitis, & Tobacco Use Disorder.  HPI  Mild COPD:  Treated for mild exacerbation at last appointment with Prednisone 40mg  daily for 4 days and switched from Anoro to  [place]. She reports she is still coughing intermittently. She reports she is using her rescue inhaler 6 times a week. She denies any nocturnal awakenings with coughing or wheezing. No exacerbations since appointment.  Chronic Allergic Rhinitis:  Restarted Flonase at last appointment & started Singulair 10mg  qhs. She didn't pick up the Singulair and hasn't yet restarted Flonase. She has been avoiding inhaled irritants which seems to have helped. She reports her sinus congestion is primarily in the morning. Reports clear sinus drainage.   Tobacco Use Disorder:  Previously had adverse reaction to Chantix. Still smoking 1/2 to 3/4 ppd. She has had multiple deaths recently.    Review of Systems  No fever or chills. No nausea or emesis. No chest pain or pressure.   Allergies  Allergen Reactions  . Penicillins     Current Outpatient Prescriptions on File Prior to Visit  Medication Sig Dispense Refill  . albuterol (PROAIR HFA) 108 (90 Base) MCG/ACT inhaler INHALE 1-2 PUFFS INTO THE LUNGS EVERY 6 (SIX) HOURS AS NEEDED FOR WHEEZING. 6.7 each 2  . albuterol (PROVENTIL) (2.5 MG/3ML) 0.083% nebulizer solution Take 3 mLs (2.5 mg total) by nebulization every 4 (four) hours as needed for wheezing or shortness of breath. 120 mL 6  . amLODipine-olmesartan (AZOR) 5-40 MG tablet Take 1 tablet by mouth daily. 90 tablet 0  . aspirin 81 MG tablet Take 81 mg by mouth daily.    . citalopram (CELEXA) 40 MG tablet Take 1 tablet by mouth daily.    . clonazePAM (KLONOPIN) 0.5 MG tablet Take 0.5 mg by mouth 3 (three) times daily as needed.     Marland Kitchen dextromethorphan (DELSYM) 30 MG/5ML liquid Take 30 mg by mouth  as needed for cough.    . fluticasone furoate-vilanterol (BREO ELLIPTA) 200-25 MCG/INH AEPB Inhale 1 puff into the lungs daily. 14 each 0  . metFORMIN (GLUCOPHAGE-XR) 500 MG 24 hr tablet Take 500 mg by mouth daily with breakfast.    . rosuvastatin (CRESTOR) 10 MG tablet Take 10 mg by mouth daily.    . montelukast (SINGULAIR) 10 MG tablet Take 1 tablet (10 mg total) by mouth at bedtime. (Patient not taking: Reported on 12/09/2016) 30 tablet 0   No current facility-administered medications on file prior to visit.     Past Medical History:  Diagnosis Date  . Allergic rhinitis, cause unspecified   . Anxiety state, unspecified   . Chronic airway obstruction, not elsewhere classified   . Depressive disorder, not elsewhere classified   . Essential hypertension, benign   . Fatigue   . Hyperlipidemia   . Lower back pain   . Menopause   . Panic disorder without agoraphobia   . Seborrheic keratosis   . Type II or unspecified type diabetes mellitus without mention of complication, not stated as uncontrolled   . Unspecified arthropathy, shoulder region     Past Surgical History:  Procedure Laterality Date  . APPENDECTOMY    . BREAST BIOPSY  12/2012   benign  . CESAREAN SECTION    . TONSILLECTOMY    . TUBAL LIGATION      Family History  Problem Relation  Age of Onset  . Cancer    . Allergies    . Diabetes    . Heart disease    . Hypertension    . Migraines    . Osteoporosis    . Seizures    . COPD Mother   . Asthma Son     Social History   Social History  . Marital status: Single    Spouse name: N/A  . Number of children: N/A  . Years of education: N/A   Social History Main Topics  . Smoking status: Current Every Day Smoker    Packs/day: 0.50    Years: 30.00    Types: Cigarettes  . Smokeless tobacco: Never Used     Comment: greater 50 pack yr.  peak rate 1PPD. //0.5ppd 12.14.17  . Alcohol use Yes     Comment: 2 drinks/day or fewer  . Drug use: Unknown  . Sexual  activity: Not Asked   Other Topics Concern  . None   Social History Narrative   Originally from [place] Always lived in [place]. 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 Also worked in [employer]. She has a dog currently. Remote exposure to love birds. No mold exposure.       Objective:   Physical Exam BP 104/62 (BP Location: Left Arm, Cuff Size: Normal)   Pulse 85   Ht 5' 1.5" (1.562 m)   Wt 122 lb 12.8 oz (55.7 kg)   SpO2 94%   BMI 22.83 kg/m  General:  Awake. Thin female. Comfortable. Integument:  Warm & dry. No rash on exposed skin. HEENT:  Mildly swollen and erythematous nasal turbinates bilaterally. No scleral injection or icterus.  Cardiovascular:  Regular rate and rhythm. No edema. Normal S1 & S2. Pulmonary:  Good aeration bilaterally. Wheezing has resolved. Speaking in complete sentences. Abdomen: Soft. Normal bowel sounds. Nontender. Musculoskeletal:  Normal bulk and tone. No joint effusion appreciated.  PFT 09/14/16: FVC 1.90 L (63%) FEV1 1.30 L (86%) FEV1/FVC 0.69 FEF 25-75 0.74 L (34%) no bronchodilator response TLC 4.86 L (103%) RV 156% ERV 23% DLCO corrected 68% (Hgb 15.6) 04/24/13: FVC 2.26 L (81%) FEV1 1.54 L (75%) FEV1/FVC 0.68 FEF 25-75 0.96 L (38%) no bronchodilator response TLC 4.74 L (110%) RV 158% ERV 59% DLCO corrected 82%  6MWT 11/11/16:  Walked 460 meters / Baseline Sat 93% on RA / Nadir Sat 93% on RA @ rest  IMAGING CXR PA/LAT 02/07/13 (previously reviewed by me): No pleural effusion appreciated. No parenchymal nodular opacity. Heart normal in size & mediastinum normal.    Assessment & Plan:  60 y.o. female with underlying Mild COPD, Chronic Allergic Rhinitis, & Tobacco Use Disorder. Patient seems to have recovered well from her previous COPD exacerbation. Better symptomatic control on Breo. I did spend over 3 minutes today counseling the patient on the need for tobacco cessation but she does have significant psychosocial stressors that  prohibit this. I do question whether or not her allergic rhinitis is being exacerbated by her chronic tobacco use. I instructed the patient to contact my office if she had any new breathing problems or questions before her next appointment.  1. Mild COPD w/ Mild: Screening for alpha-1 antitrypsin today. Continuing Breo 200. Repeat spirometry with bronchodilator challenge at next appointment. 2. Chronic Allergic Rhinitis: Restarting patient on Flonase 1 spray each nostril twice daily. Checking CBC with differential and RAST panel. 3. Tobacco Use Disorder: Patient counseled for over 3 minutes of the need for  complete tobacco cessation. Again recommended trying nicotine replacement with gum for intermittent cravings. 4. Health Maintenance:  S/P Influenza Vaccine November 2017, Pneumovax September 2014, Prevnar July 2015, & Tdap February 2015.  5. Follow-up:  Return to clinic in 3 months or sooner if needed.   Donna ChristenJennings E. Jamison NeighborNestor, M.D. [facility] Pager:  [phone] After 3pm or if no response, call [phone] 9:10 AM 12/09/16

## 2016-12-09 NOTE — Patient Instructions (Addendum)
   Continue using your Breo as prescribed.  Go ahead and restart back on Flonase - 1 spray each nostril twice a day.  Call me if you have any breathing problems before your next appointment.   TESTS ORDERED: 1. Alpha-1 Antitrypsin Phenotype, RAST Panel, & CBC with differential ordered at last appointment & need done today  2. Spirometry with bronchodilator challenge at next appointment

## 2016-12-09 NOTE — Addendum Note (Signed)
Addended by: Sheran LuzEAST, Ioana Louks K on: 12/09/2016 09:24 AM   Modules accepted: Orders

## 2016-12-10 LAB — CBC WITH DIFFERENTIAL/PLATELET
BASOS ABS: 0 10*3/uL (ref 0.0–0.1)
Basophils Relative: 0.4 % (ref 0.0–3.0)
EOS ABS: 0.3 10*3/uL (ref 0.0–0.7)
Eosinophils Relative: 5.8 % — ABNORMAL HIGH (ref 0.0–5.0)
HEMATOCRIT: 43.8 % (ref 36.0–46.0)
HEMOGLOBIN: 15 g/dL (ref 12.0–15.0)
LYMPHS PCT: 13.8 % (ref 12.0–46.0)
Lymphs Abs: 0.8 10*3/uL (ref 0.7–4.0)
MCHC: 34.3 g/dL (ref 30.0–36.0)
MCV: 101.6 fl — ABNORMAL HIGH (ref 78.0–100.0)
MONOS PCT: 11.2 % (ref 3.0–12.0)
Monocytes Absolute: 0.7 10*3/uL (ref 0.1–1.0)
Neutro Abs: 4 10*3/uL (ref 1.4–7.7)
Neutrophils Relative %: 68.8 % (ref 43.0–77.0)
Platelets: 166 10*3/uL (ref 150.0–400.0)
RBC: 4.31 Mil/uL (ref 3.87–5.11)
RDW: 13 % (ref 11.5–15.5)
WBC: 5.8 10*3/uL (ref 4.0–10.5)

## 2016-12-10 LAB — RESPIRATORY ALLERGY PROFILE REGION II ~~LOC~~
Allergen, C. Herbarum, M2: <0.1 kU/L
Allergen, Cedar tree, t12: <0.1 kU/L
Allergen, Cottonwood, t14: <0.1 kU/L
Allergen, Mouse Urine Protein, e78: <0.1 kU/L
Allergen, P. notatum, m1: <0.1 kU/L
Aspergillus fumigatus, m3: <0.1 kU/L
Bermuda Grass: <0.1 kU/L
Common Ragweed: <0.1 kU/L
Dog Dander: <0.1 kU/L
Elm IgE: <0.1 kU/L
IGE (IMMUNOGLOBULIN E), SERUM: 339 kU/L — AB (ref <115)
Pecan/Hickory Tree IgE: <0.1 kU/L
Timothy Grass: <0.1 kU/L

## 2016-12-14 LAB — ALPHA-1 ANTITRYPSIN PHENOTYPE: A-1 Antitrypsin: 163 mg/dL (ref 83–199)

## 2017-03-15 ENCOUNTER — Encounter: Payer: Self-pay | Admitting: Pulmonary Disease

## 2017-03-15 ENCOUNTER — Ambulatory Visit (INDEPENDENT_AMBULATORY_CARE_PROVIDER_SITE_OTHER): Payer: BLUE CROSS/BLUE SHIELD | Admitting: Pulmonary Disease

## 2017-03-15 ENCOUNTER — Telehealth: Payer: Self-pay | Admitting: Pulmonary Disease

## 2017-03-15 VITALS — BP 98/70 | HR 88 | Ht 61.5 in | Wt 118.8 lb

## 2017-03-15 DIAGNOSIS — J309 Allergic rhinitis, unspecified: Secondary | ICD-10-CM | POA: Diagnosis not present

## 2017-03-15 DIAGNOSIS — F17200 Nicotine dependence, unspecified, uncomplicated: Secondary | ICD-10-CM | POA: Diagnosis not present

## 2017-03-15 DIAGNOSIS — J449 Chronic obstructive pulmonary disease, unspecified: Secondary | ICD-10-CM

## 2017-03-15 LAB — PULMONARY FUNCTION TEST
FEF 25-75 POST: 1.27 L/s
FEF 25-75 Pre: 0.93 L/sec
FEF2575-%Change-Post: 36 %
FEF2575-%Pred-Post: 58 %
FEF2575-%Pred-Pre: 42 %
FEV1-%CHANGE-POST: 9 %
FEV1-%PRED-PRE: 61 %
FEV1-%Pred-Post: 67 %
FEV1-POST: 1.55 L
FEV1-PRE: 1.42 L
FEV1FVC-%Change-Post: 0 %
FEV1FVC-%PRED-PRE: 90 %
FEV6-%Change-Post: 9 %
FEV6-%PRED-POST: 75 %
FEV6-%PRED-PRE: 69 %
FEV6-POST: 2.17 L
FEV6-Pre: 1.98 L
FEV6FVC-%CHANGE-POST: 0 %
FEV6FVC-%PRED-POST: 104 %
FEV6FVC-%PRED-PRE: 103 %
FVC-%CHANGE-POST: 8 %
FVC-%PRED-POST: 73 %
FVC-%PRED-PRE: 67 %
FVC-POST: 2.17 L
FVC-PRE: 2 L
POST FEV6/FVC RATIO: 100 %
PRE FEV6/FVC RATIO: 99 %
Post FEV1/FVC ratio: 71 %
Pre FEV1/FVC ratio: 71 %

## 2017-03-15 NOTE — Telephone Encounter (Signed)
LABS 12/09/16 Alpha-1 antitrypsin: MM (163) IgE: 339 RAST panel: Negative CBC: 5.8/15.0/43.8/166 Eosinophils: 0.3

## 2017-03-15 NOTE — Progress Notes (Signed)
PFT done today. 

## 2017-03-15 NOTE — Progress Notes (Signed)
Subjective:    Patient ID: Jeanne Becker, female    DOB: 10/02/1956, 61 y.o.   MRN: [id number]  C.C.:  Follow-up for Moderate-Severe COPD, Chronic Allergic Rhinitis, & Tobacco Use Disorder.  HPI  Moderate-Severe COPD: Continued on Breo at last appointment. Previously switched from Anoro. She reports she has fluctuating shortness of breath, coughing, & wheezing. [facility]. She is using her rescue inhaler up to 2 times per day. She does wake up occasionally at night with coughing and uses her rescue inhaler. She is using Delsym to help with her coughing.   Chronic allergic rhinitis: Previously on Singulair. Restarted Flonase 1 spray each nostril twice daily at last appointment. She has noticed increased sinus congestion & drainage. She is using her Singulair intermittently. She reports she hasn't restarted her Flonase recently.   Tobacco use disorder: Previous adverse reaction to Chantix. At last appointment patient was smoking one half to three-quarter pack per day. Recommended nicotine replacement at last appointment. She is still smoking 3/4 pack per day.    Review of Systems  She reports pain from her "teeth infections". She reports some pain in her left neck & into her left ear. She denies any subjective fever, chills, or sweats. No chest pain or pressure.   Allergies  Allergen Reactions  . Penicillins     Current Outpatient Prescriptions on File Prior to Visit  Medication Sig Dispense Refill  . albuterol (PROAIR HFA) 108 (90 Base) MCG/ACT inhaler INHALE 1-2 PUFFS INTO THE LUNGS EVERY 6 (SIX) HOURS AS NEEDED FOR WHEEZING. 6.7 each 2  . albuterol (PROVENTIL) (2.5 MG/3ML) 0.083% nebulizer solution Take 3 mLs (2.5 mg total) by nebulization every 4 (four) hours as needed for wheezing or shortness of breath. 120 mL 6  . aspirin 81 MG tablet Take 81 mg by mouth daily.    . citalopram (CELEXA) 40 MG tablet Take 1 tablet by mouth daily.    . clonazePAM (KLONOPIN) 0.5 MG tablet Take  0.5 mg by mouth 3 (three) times daily as needed.     Marland Kitchen. dextromethorphan (DELSYM) 30 MG/5ML liquid Take 30 mg by mouth as needed for cough.    . fluticasone furoate-vilanterol (BREO ELLIPTA) 200-25 MCG/INH AEPB Inhale 1 puff into the lungs daily. 14 each 0  . metFORMIN (GLUCOPHAGE-XR) 500 MG 24 hr tablet Take 500 mg by mouth daily with breakfast.    . rosuvastatin (CRESTOR) 10 MG tablet Take 10 mg by mouth daily.    Marland Kitchen. amLODipine-olmesartan (AZOR) 5-40 MG tablet Take 1 tablet by mouth daily. (Patient not taking: Reported on 03/15/2017) 90 tablet 0  . montelukast (SINGULAIR) 10 MG tablet Take 1 tablet (10 mg total) by mouth at bedtime. (Patient not taking: Reported on 03/15/2017) 30 tablet 0   No current facility-administered medications on file prior to visit.     Past Medical History:  Diagnosis Date  . Allergic rhinitis, cause unspecified   . Anxiety state, unspecified   . Chronic airway obstruction, not elsewhere classified   . Depressive disorder, not elsewhere classified   . Essential hypertension, benign   . Fatigue   . Hyperlipidemia   . Lower back pain   . Menopause   . Panic disorder without agoraphobia   . Seborrheic keratosis   . Type II or unspecified type diabetes mellitus without mention of complication, not stated as uncontrolled   . Unspecified arthropathy, shoulder region     Past Surgical History:  Procedure Laterality Date  . APPENDECTOMY    .  BREAST BIOPSY  12/2012   benign  . CESAREAN SECTION    . TONSILLECTOMY    . TUBAL LIGATION      Family History  Problem Relation Age of Onset  . Cancer    . Allergies    . Diabetes    . Heart disease    . Hypertension    . Migraines    . Osteoporosis    . Seizures    . COPD Mother   . Asthma Son     Social History   Social History  . Marital status: Single    Spouse name: N/A  . Number of children: N/A  . Years of education: N/A   Social History Main Topics  . Smoking status: Current Every Day Smoker     Packs/day: 0.50    Years: 30.00    Types: Cigarettes  . Smokeless tobacco: Never Used     Comment: greater 50 pack yr.  peak rate 1PPD. //0.75 ppd 03/15/17  . Alcohol use Yes     Comment: 2 drinks/day or fewer  . Drug use: Unknown  . Sexual activity: Not Asked   Other Topics Concern  . None   Social History Narrative   Originally from [place]. Always lived in [place]. 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 She has a dog currently. Remote exposure to love birds. No mold exposure.       Objective:   Physical Exam BP 98/70 (BP Location: Left Arm, Patient Position: Sitting, Cuff Size: Normal)   Pulse 88   Ht 5' 1.5" (1.562 m)   Wt 118 lb 12.8 oz (53.9 kg)   SpO2 93%   BMI 22.08 kg/m   Gen.: No distress. Comfortable. Thin, Caucasian female. Integument: No rash or bruising on exposed skin. Warm and dry. HEENT: Mild bilateral nasal turbinate swelling. Poor dentition. No evidence of gingival swelling. Normal left external auditory canal and tympanic membrane. Lymphatics: Mildly tender cervical lymphadenopathy with only mild enlargement with palpation. No mass appreciated in the neck. No supraclavicular lymphadenopathy. Cardiovascular: Regular rate. Normal S1 & S2. Regular rhythm. No edema. Pulmonary: Good aeration bilaterally. Clear with auscultation. No accessory muscle use on room air. Abdomen: Soft. Nondistended. Normal bowel sounds.  PFT 03/15/17: FVC 2.00 L (67%) FEV1 1.14 L (61%) FEV1/FVC 0.71 FEF 25-75 0.93 L (42%) no bronchodilator response 09/14/16: FVC 1.90 L (63%) FEV1 1.30 L (56%) FEV1/FVC 0.69 FEF 25-75 0.74 L (34%) no bronchodilator response TLC 4.86 L (103%) RV 156% ERV 23% DLCO corrected 68% (Hgb 15.6) 04/24/13: FVC 2.26 L (81%) FEV1 1.54 L (75%) FEV1/FVC 0.68 FEF 25-75 0.96 L (38%) no bronchodilator response TLC 4.74 L (110%) RV 158% ERV 59% DLCO corrected 82%  11/11/16:  Walked 460 meters / Baseline Sat 93% on RA /  Nadir Sat 93% on RA @ rest  IMAGING CXR PA/LAT 02/07/13 (previously reviewed by me): No pleural effusion appreciated. No parenchymal nodular opacity. Heart normal in size & mediastinum normal.  LABS 12/09/16 Alpha-1 antitrypsin: MM (163) IgE: 339 RAST panel: Negative CBC: 5.8/15.0/43.8/166 Eosinophils: 0.3    Assessment & Plan:  61 y.o. female with moderate-severe COPD, chronic allergic rhinitis, & tobacco use disorder. Patient counseled for over 3 minutes and need for tobacco cessation to prevent worsening of lung function. Her spirometry today continues to worsen with regards to her FEV1. I do believe her symptoms are multifactorial not only from her ongoing tobacco use but also increasing pollen counts  and likely some element of reactive airways disease. I instructed the patient to contact my office if she had any new breathing problems before her next appointment as I would be happy to see her sooner.  1. Moderate-Severe COPD: Continuing Breo. No changes. 2. Chronic allergic rhinitis: Patient advised to use Singulair daily as prescribed. Also recommended again to resume her Flonase 1 spray in each nostril twice daily. 3. Tobacco use disorder: Counseled for over 3 minutes and need for complete tobacco cessation. Multiple psychosocial stressors preventing her cessation. 4. Health maintenance: Status post Influenza Vaccine November 2017, Pneumovax September 2014, Prevnar July 2015, & Tdap February 2015.  5. Follow-up: Return to clinic in 6 months or sooner if needed.  Donna Christen Jamison Neighbor, M.D. [facility] Pager:  [phone] After 3pm or if no response, call [phone] 10:12 AM 03/15/17

## 2017-03-15 NOTE — Patient Instructions (Addendum)
   Make sure you start back taking your Flonase 1 spray in each nostril twice daily.  Make sure to take your Singulair every day.   Continue taking your breathing medications as prescribed.   Call me if you have any new breathing problems before your next appointment.  Make sure you get your teeth checked out to see if you need more antibiotics.

## 2017-07-12 ENCOUNTER — Telehealth: Payer: Self-pay | Admitting: Pulmonary Disease

## 2017-07-12 NOTE — Telephone Encounter (Signed)
Spoke with patient. She wanted to know if the ProAir and Ventolin were the same medication. She verbalized understanding. Nothing further needed.

## 2017-09-05 ENCOUNTER — Other Ambulatory Visit: Payer: Self-pay | Admitting: Pulmonary Disease

## 2017-09-15 ENCOUNTER — Ambulatory Visit (INDEPENDENT_AMBULATORY_CARE_PROVIDER_SITE_OTHER): Payer: BLUE CROSS/BLUE SHIELD | Admitting: Pulmonary Disease

## 2017-09-15 ENCOUNTER — Encounter: Payer: Self-pay | Admitting: Pulmonary Disease

## 2017-09-15 VITALS — BP 138/80 | HR 93 | Ht 61.5 in | Wt 113.2 lb

## 2017-09-15 DIAGNOSIS — J309 Allergic rhinitis, unspecified: Secondary | ICD-10-CM | POA: Diagnosis not present

## 2017-09-15 DIAGNOSIS — Z23 Encounter for immunization: Secondary | ICD-10-CM | POA: Diagnosis not present

## 2017-09-15 DIAGNOSIS — J449 Chronic obstructive pulmonary disease, unspecified: Secondary | ICD-10-CM | POA: Diagnosis not present

## 2017-09-15 DIAGNOSIS — F17200 Nicotine dependence, unspecified, uncomplicated: Secondary | ICD-10-CM | POA: Diagnosis not present

## 2017-09-15 DIAGNOSIS — B370 Candidal stomatitis: Secondary | ICD-10-CM | POA: Insufficient documentation

## 2017-09-15 MED ORDER — FLUTICASONE-UMECLIDIN-VILANT 100-62.5-25 MCG/INH IN AEPB: 1.0000 | INHALATION_SPRAY | Freq: Every day | RESPIRATORY_TRACT | 0 refills | Status: DC

## 2017-09-15 MED ORDER — NYSTATIN 100000 UNIT/ML MT SUSP: 5.0000 mL | Freq: Four times a day (QID) | OROMUCOSAL | 0 refills | Status: DC

## 2017-09-15 NOTE — Progress Notes (Signed)
Subjective:    Patient ID: Jeanne Becker, female    DOB: September 24, 1956, 61 y.o.   MRN: [id number]  C.C.:  Follow-up for Moderate-Severe COPD, Chronic Allergic Rhinitis, & Tobacco Use Disorder.  HPI  Moderate-severe COPD: Prescribed Breo. Patient also previously on Anoro without symptomatic benefit. She reports she has had increased dyspnea with increased heat & humidity. She has developed a mild, cough producing a "yellow" mucus starting today. She has had some mild wheezing. She is still using Breo. She is using her rescue inhaler at most 2-3 times daily but mostly just once daily. She does wake up coughing at night at times.  Chronic allergic rhinitis: Restarted on Flonase twice daily at last appointment & recommended to use Singulair daily as prescribed. She has had more sinus congestion since she had her teeth pulled since last appointment. She reports she is using her Singulair and Flonase intermittently but definitely not daily.   Tobacco use disorder: Previously had adverse reaction to Chantix. Patient smoking three-quarter pack per day at last appointment. Multiple psychosocial stressors previously preventing attempts at tobacco cessation. She is back up to 1ppd. She reports her mother is ill. She feels like she is unable to quit. Previously tried nicotine patches.    Review of Systems  She reports she has lost weight and has no appetite. No chest pain or pressure. No fever or chills. No abdominal pain or nausea.   Allergies  Allergen Reactions  . Penicillins     Current Outpatient Prescriptions on File Prior to Visit  Medication Sig Dispense Refill  . albuterol (PROVENTIL) (2.5 MG/3ML) 0.083% nebulizer solution Take 3 mLs (2.5 mg total) by nebulization every 4 (four) hours as needed for wheezing or shortness of breath. 120 mL 6  . amLODipine (NORVASC) 10 MG tablet Take 10 mg by mouth daily.    Marland Kitchen amLODipine-olmesartan (AZOR) 5-40 MG tablet Take 1 tablet by mouth daily. 90 tablet 0    . citalopram (CELEXA) 40 MG tablet Take 1 tablet by mouth daily.    . clonazePAM (KLONOPIN) 0.5 MG tablet Take 0.5 mg by mouth 3 (three) times daily as needed.     Marland Kitchen dextromethorphan (DELSYM) 30 MG/5ML liquid Take 30 mg by mouth as needed for cough.    . ergocalciferol (VITAMIN D2) 50000 units capsule Take 50,000 Units by mouth 2 (two) times a week.    . fluticasone furoate-vilanterol (BREO ELLIPTA) 200-25 MCG/INH AEPB Inhale 1 puff into the lungs daily. 14 each 0  . losartan (COZAAR) 100 MG tablet Take 100 mg by mouth daily.    . metFORMIN (GLUCOPHAGE-XR) 500 MG 24 hr tablet Take 500 mg by mouth daily with breakfast.    . montelukast (SINGULAIR) 10 MG tablet Take 1 tablet (10 mg total) by mouth at bedtime. 30 tablet 0  . rosuvastatin (CRESTOR) 10 MG tablet Take 10 mg by mouth daily.    . VENTOLIN HFA 108 (90 Base) MCG/ACT inhaler USE ONE OR TWO puffs by MOUTH every SIX hours as needed FOR wheezing 18 g 2   No current facility-administered medications on file prior to visit.     Past Medical History:  Diagnosis Date  . Allergic rhinitis, cause unspecified   . Anxiety state, unspecified   . Chronic airway obstruction, not elsewhere classified   . Depressive disorder, not elsewhere classified   . Essential hypertension, benign   . Fatigue   . Hyperlipidemia   . Lower back pain   . Menopause   .  Panic disorder without agoraphobia   . Seborrheic keratosis   . Type II or unspecified type diabetes mellitus without mention of complication, not stated as uncontrolled   . Unspecified arthropathy, shoulder region     Past Surgical History:  Procedure Laterality Date  . APPENDECTOMY    . BREAST BIOPSY  12/2012   benign  . CESAREAN SECTION    . TONSILLECTOMY    . TUBAL LIGATION      Family History  Problem Relation Age of Onset  . Cancer Unknown   . Allergies Unknown   . Diabetes Unknown   . Heart disease Unknown   . Hypertension Unknown   . Migraines Unknown   . Osteoporosis  Unknown   . Seizures Unknown   . COPD Mother   . Asthma Son     Social History   Social History  . Marital status: Single    Spouse name: N/A  . Number of children: N/A  . Years of education: N/A   Social History Main Topics  . Smoking status: Current Every Day Smoker    Packs/day: 0.50    Years: 30.00    Types: Cigarettes  . Smokeless tobacco: Never Used     Comment: greater 50 pack yr.  peak rate 1PPD. //0.75 ppd 03/15/17  . Alcohol use Yes     Comment: 2 drinks/day or fewer  . Drug use: Unknown  . Sexual activity: Not Asked   Other Topics Concern  . None   Social History Narrative   Originally from [place]. Always lived in [place]. 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 She has a dog currently. Remote exposure to love birds. No mold exposure.       Objective:   Physical Exam BP 138/80 (BP Location: Left Arm, Cuff Size: Normal)   Pulse 93   Ht 5' 1.5" (1.562 m)   Wt 113 lb 4 oz (51.4 kg)   SpO2 97%   BMI 21.05 kg/m   General:  Thin, Caucasian female. No distress. Alert. Integument:  Warm & dry. No rash on exposed skin.  Extremities:  No cyanosis or clubbing.  HEENT:  Poor dentition. Moderate bilateral nasal turbinate swelling. Thrush present on time. Cardiovascular:  Regular rate. No edema. Systolic ejection murmur appreciated.  Pulmonary:  Mild bilateral wheeze predominantly in the apices. Good aeration bilaterally. Normal work of breathing on room air. Abdomen: Soft. Normal bowel sounds. Nondistended.  Musculoskeletal:  Normal bulk and tone. No joint deformity or effusion appreciated.  PFT 03/15/17: FVC 2.00 L (67%) FEV1 1.14 L (61%) FEV1/FVC 0.71 FEF 25-75 0.93 L (42%) no bronchodilator response 09/14/16: FVC 1.90 L (63%) FEV1 1.30 L (56%) FEV1/FVC 0.69 FEF 25-75 0.74 L (34%) no bronchodilator response TLC 4.86 L (103%) RV 156% ERV 23% DLCO corrected 68% (Hgb 15.6) 04/24/13: FVC 2.26 L (81%) FEV1 1.54 L (75%)  FEV1/FVC 0.68 FEF 25-75 0.96 L (38%) no bronchodilator response TLC 4.74 L (110%) RV 158% ERV 59% DLCO corrected 82%  11/11/16:  Walked 460 meters / Baseline Sat 93% on RA / Nadir Sat 93% on RA @ rest  IMAGING CXR PA/LAT 02/07/13 (previously reviewed by me): No pleural effusion appreciated. No parenchymal nodular opacity. Heart normal in size & mediastinum normal.  LABS 12/09/16 Alpha-1 antitrypsin: MM (163) IgE: 339 RAST panel: Negative CBC: 5.8/15.0/43.8/166 Eosinophils: 0.3    Assessment & Plan:  61 y.o. female with moderate-severe COPD, chronic allergic rhinitis, and tobacco use disorder.  Patient continues to have ongoing symptoms from her COPD likely due to her continued tobacco use. She admits she is unable to quit at this time. I believe she may benefit from transitioning to an alternative inhaler regimen. We also reviewed proper oral hygiene given the oral thrush seeing on physical exam today. I instructed the patient contact me if she had any new breathing problems or questions before her next appointment.  1. Moderate-severe COPD:  Patient given sample of Trelegy  to try and place of Breo. She will contact me for a prescription if this is effective. Continuing albuterol rescue inhaler.  2. Chronic allergic rhinitis: Patient recommended to use Singulair and Flonase as prescribed.  3. Oral thrush: Prescribing nystatin swish and swallow. 4. Heart murmur: Plan to address at next appointment. 5. Tobacco use disorder:  Counseled for over 3 minutes on need for complete tobacco cessation. Patient admits she is unable to quit at this time.  6. Health maintenance:  Status post Pneumovax September 2014, Prevnar July 2015, & Tdap February 2015. administering high-dose influenza vaccine today.  7. Follow-up: Return to clinic in  6 months or sooner if needed.   Donna Christen Jamison Neighbor, M.D. [facility] Pager:  [phone] After 3pm or if no response, call  [phone] 9:51 AM 09/15/17

## 2017-09-15 NOTE — Patient Instructions (Addendum)
   Continue using your medications as prescribed.  Try using your Singulair daily along with your Flonase. This should help your sinus congestion & drainage.   Use the Trelegy sample inhaler we are giving you today in place of your Breo. If this seems to help your breathing more then call and we will send in a prescription.  Remember to remove any dentures or partials you have before you use your inhaler. Remember to brush your teeth & tongue after you use your inhaler as well as rinse, gargle & spit to keep from getting thrush in your mouth or on your tongue (a white film).   I'm also giving you a mouth wash to use until the white film on your tongue goes away.  Call me if you have any new breathing problems or questions before your next appointment.

## 2017-10-25 ENCOUNTER — Telehealth: Payer: Self-pay | Admitting: Pulmonary Disease

## 2017-10-25 MED ORDER — FLUTICASONE-UMECLIDIN-VILANT 100-62.5-25 MCG/INH IN AEPB: 1.0000 | INHALATION_SPRAY | Freq: Every day | RESPIRATORY_TRACT | 6 refills | Status: DC

## 2017-10-25 NOTE — Telephone Encounter (Signed)
Called and spoke with pt and she stated that the trelegy was much better for her than the breo and would like to stay on this.  This has been sent to her pharmacy and nothing further is needed.

## 2018-03-14 ENCOUNTER — Ambulatory Visit: Payer: Medicare Other | Admitting: Emergency Medicine

## 2018-03-14 ENCOUNTER — Encounter: Payer: Self-pay | Admitting: Emergency Medicine

## 2018-03-14 DIAGNOSIS — J309 Allergic rhinitis, unspecified: Secondary | ICD-10-CM

## 2018-03-14 DIAGNOSIS — F17200 Nicotine dependence, unspecified, uncomplicated: Secondary | ICD-10-CM | POA: Diagnosis not present

## 2018-03-14 DIAGNOSIS — J449 Chronic obstructive pulmonary disease, unspecified: Secondary | ICD-10-CM

## 2018-03-14 MED ORDER — ALBUTEROL SULFATE HFA 108 (90 BASE) MCG/ACT IN AERS: 2.0000 | INHALATION_SPRAY | RESPIRATORY_TRACT | 5 refills | Status: DC | PRN

## 2018-03-14 NOTE — Assessment & Plan Note (Signed)
Please continue Trelegy 1 inhalation once a day.  Remember to rinse and gargle after using this medication. We will change your rescue inhaler pro-air.  Use this 2 puffs if needed for shortness of breath, chest tightness, wheezing. Follow with Dr Delton CoombesByrum in 6 months or sooner if you have any problems

## 2018-03-14 NOTE — Progress Notes (Signed)
Subjective:    Patient ID: Jeanne Becker, female    DOB: 1956/10/28, 62 y.o.   MRN: [id number]  HPI 62 year old active smoker (30 pack years, 1 pk a day right now) who has been followed in our office by Dr. Jamison NeighborNestor for COPD, chronic allergic rhinitis.  Reviewed her pulmonary function testing from 03/15/17.  She has moderately severe obstruction without a significant bronchodilator response.  At her last visit with Dr. Jamison NeighborNestor she was tried on Trelegy to see if she would benefit. She feels that she is doing well on it, less throat irritation and better breathing. She needs to change her albuterol to ProAir.   She has had more sinus congestion and rhinitis for about a month. She tried singulair but it didn't help. She is on flonase but not every day. Tried loratadine but felt that it dried her out too much.   Flu shot up-to-date.  She had the Prevnar 13 in July 2015, Pneumovax in September 2014.   Review of Systems Past Medical History:  Diagnosis Date  . Allergic rhinitis, cause unspecified   . Anxiety state, unspecified   . Chronic airway obstruction, not elsewhere classified   . Depressive disorder, not elsewhere classified   . Essential hypertension, benign   . Fatigue   . Hyperlipidemia   . Lower back pain   . Menopause   . Panic disorder without agoraphobia   . Seborrheic keratosis   . Type II or unspecified type diabetes mellitus without mention of complication, not stated as uncontrolled   . Unspecified arthropathy, shoulder region      Family History  Problem Relation Age of Onset  . Cancer Unknown   . Allergies Unknown   . Diabetes Unknown   . Heart disease Unknown   . Hypertension Unknown   . Migraines Unknown   . Osteoporosis Unknown   . Seizures Unknown   . COPD Mother   . Asthma Son      Social History   Socioeconomic History  . Marital status: Single    Spouse name: Not on file  . Number of children: Not on file  . Years of education: Not on file  .  Highest education level: Not on file  Social Needs  . Financial resource strain: Not on file  . Food insecurity - worry: Not on file  . Food insecurity - inability: Not on file  . Transportation needs - medical: Not on file  . Transportation needs - non-medical: Not on file  Occupational History  . Not on file  Tobacco Use  . Smoking status: Current Every Day Smoker    Packs/day: 1.00    Years: 30.00    Pack years: 30.00    Types: Cigarettes  . Smokeless tobacco: Never Used  Substance and Sexual Activity  . Alcohol use: Yes    Comment: 2 drinks/day or fewer  . Drug use: Not on file  . Sexual activity: Not on file  Other Topics Concern  . Not on file  Social History Narrative   Originally from [place]. Always lived in [place]. 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 Also worked in [employer]. She has a dog currently. Remote exposure to love birds. No mold exposure.      Allergies  Allergen Reactions  . Penicillins      Outpatient Medications Prior to Visit  Medication Sig Dispense Refill  . albuterol (PROVENTIL) (2.5 MG/3ML) 0.083% nebulizer solution Take 3 mLs (2.5 mg total) by  nebulization every 4 (four) hours as needed for wheezing or shortness of breath. 120 mL 6  . amLODipine (NORVASC) 10 MG tablet Take 10 mg by mouth daily.    . clonazePAM (KLONOPIN) 0.5 MG tablet Take 0.5 mg by mouth 3 (three) times daily as needed.     Marland Kitchen dextromethorphan (DELSYM) 30 MG/5ML liquid Take 30 mg by mouth as needed for cough.    . Fluticasone-Umeclidin-Vilant (TRELEGY ELLIPTA) 100-62.5-25 MCG/INH AEPB Inhale 1 Inhaler into the lungs daily. 1 each 6  . losartan (COZAAR) 100 MG tablet Take 100 mg by mouth daily.    . metFORMIN (GLUCOPHAGE-XR) 500 MG 24 hr tablet Take 500 mg by mouth daily with breakfast.    . rosuvastatin (CRESTOR) 10 MG tablet Take 10 mg by mouth daily.    . citalopram (CELEXA) 40 MG tablet Take 1 tablet by mouth daily.    . montelukast (SINGULAIR) 10 MG  tablet Take 1 tablet (10 mg total) by mouth at bedtime. 30 tablet 0  . VENTOLIN HFA 108 (90 Base) MCG/ACT inhaler USE ONE OR TWO puffs by MOUTH every SIX hours as needed FOR wheezing 18 g 2  . amLODipine-olmesartan (AZOR) 5-40 MG tablet Take 1 tablet by mouth daily. 90 tablet 0  . ergocalciferol (VITAMIN D2) 50000 units capsule Take 50,000 Units by mouth 2 (two) times a week.    . nystatin (MYCOSTATIN) 100000 UNIT/ML suspension Take 5 mLs (500,000 Units total) by mouth 4 (four) times daily. 60 mL 0   No facility-administered medications prior to visit.          Objective:   Physical Exam Vitals:   03/14/18 0910 03/14/18 0911  BP:  112/72  Pulse:  (!) 110  SpO2:  97%  Weight: 114 lb (51.7 kg)   Height: 5' 1.5" (1.562 m)    Gen: Pleasant, well-nourished, in no distress,  normal affect  ENT: No lesions,  mouth clear,  oropharynx clear, no postnasal drip  Neck: No JVD, no stridor  Lungs: No use of accessory muscle,  clear without rales or rhonchi  Cardiovascular: RRR, heart sounds normal, no murmur or gallops, no peripheral edema  Musculoskeletal: No deformities, no cyanosis or clubbing  Neuro: alert, non focal  Skin: Warm, no lesions or rashes      Assessment & Plan:  COPD, moderately severe Please continue Trelegy 1 inhalation once a day.  Remember to rinse and gargle after using this medication. We will change your rescue inhaler pro-air.  Use this 2 puffs if needed for shortness of breath, chest tightness, wheezing. Follow with Dr Delton Coombes in 6 months or sooner if you have any problems  Chronic allergic rhinitis Start using your fluticasone (Flonase) nasal spray, 2 sprays each nostril once a day every day during the allergy season. Since Claritin was too drying, you may want to consider trying either Zyrtec or Allegra to help with your nasal congestion.  Tobacco use disorder She is smoking a pack a day due to increased stressors in her life.  We talked about cessation.   A more reasonable goal right now is to try to get back down to half a pack a day.  She is going to work on this.  Did not discuss lung cancer screening in detail today but we need to do this as we go forward.  You need to work hard on decreasing your smoking.  Make it a goal to get back down to half a pack a day. In the future we  should talk about possibly referring you for lung cancer screening.  This program allows [place] to look for early lung cancer in patients who are at high risk due to cigarette smoking.  Levy Pupa, MD, PhD 03/14/2018, 9:29 AM [facility] [phone] or if no answer [phone]

## 2018-03-14 NOTE — Patient Instructions (Addendum)
Please continue Trelegy 1 inhalation once a day.  Remember to rinse and gargle after using this medication. We will change your rescue inhaler pro-air.  Use this 2 puffs if needed for shortness of breath, chest tightness, wheezing. Start using your fluticasone (Flonase) nasal spray, 2 sprays each nostril once a day every day during the allergy season. Since Claritin was too drying, you may want to consider trying either Zyrtec or Allegra to help with your nasal congestion. You need to work hard on decreasing your smoking.  Make it a goal to get back down to half a pack a day. In the future we should talk about possibly referring you for lung cancer screening.  This program allows us to look for early lung cancer in patients who are at high risk due to cigarette smoking. Follow with Dr Delton CoombesByrum in 6 months or sooner if you have any problems

## 2018-03-14 NOTE — Assessment & Plan Note (Signed)
Start using your fluticasone (Flonase) nasal spray, 2 sprays each nostril once a day every day during the allergy season. Since Claritin was too drying, you may want to consider trying either Zyrtec or Allegra to help with your nasal congestion.

## 2018-03-14 NOTE — Assessment & Plan Note (Signed)
She is smoking a pack a day due to increased stressors in her life.  We talked about cessation.  A more reasonable goal right now is to try to get back down to half a pack a day.  She is going to work on this.  Did not discuss lung cancer screening in detail today but we need to do this as we go forward.  You need to work hard on decreasing your smoking.  Make it a goal to get back down to half a pack a day. In the future we should talk about possibly referring you for lung cancer screening.  This program allows us to look for early lung cancer in patients who are at high risk due to cigarette smoking.

## 2018-09-14 ENCOUNTER — Encounter: Payer: Self-pay | Admitting: Emergency Medicine

## 2018-09-14 ENCOUNTER — Ambulatory Visit (INDEPENDENT_AMBULATORY_CARE_PROVIDER_SITE_OTHER): Payer: BLUE CROSS/BLUE SHIELD | Admitting: Emergency Medicine

## 2018-09-14 VITALS — BP 100/60 | HR 97 | Ht 61.0 in | Wt 106.0 lb

## 2018-09-14 DIAGNOSIS — J449 Chronic obstructive pulmonary disease, unspecified: Secondary | ICD-10-CM

## 2018-09-14 DIAGNOSIS — J309 Allergic rhinitis, unspecified: Secondary | ICD-10-CM | POA: Diagnosis not present

## 2018-09-14 DIAGNOSIS — Z23 Encounter for immunization: Secondary | ICD-10-CM | POA: Diagnosis not present

## 2018-09-14 DIAGNOSIS — F17200 Nicotine dependence, unspecified, uncomplicated: Secondary | ICD-10-CM | POA: Diagnosis not present

## 2018-09-14 DIAGNOSIS — Z122 Encounter for screening for malignant neoplasm of respiratory organs: Secondary | ICD-10-CM

## 2018-09-14 DIAGNOSIS — Z87891 Personal history of nicotine dependence: Secondary | ICD-10-CM

## 2018-09-14 NOTE — Progress Notes (Signed)
Subjective:    Patient ID: Jeanne Becker, female    DOB: 1956/03/22, 62 y.o.   MRN: [id number]  HPI 62 year old active smoker (30 pack years, 1 pk a day right now) who has been followed in our office by Dr. Jamison Neighbor for COPD, chronic allergic rhinitis.  Reviewed her pulmonary function testing from 03/15/17.  She has moderately severe obstruction without a significant bronchodilator response.  At her last visit with Dr. Jamison Neighbor she was tried on Trelegy to see if she would benefit. She feels that she is doing well on it, less throat irritation and better breathing. She needs to change her albuterol to ProAir.   She has had more sinus congestion and rhinitis for about a month. She tried singulair but it didn't help. She is on flonase but not every day. Tried loratadine but felt that it dried her out too much.   Flu shot up-to-date.  She had the Prevnar 13 in July 2015, Pneumovax in September 2014.  ROV 09/14/18 --Mrs. Silbernagel is 58, has a history of tobacco use with associated COPD.  Also allergic rhinitis.  She has been managed on Trelegy.  She reports that she has more difficulty with her breathing during the heat. Feels that she is benefiting from trelegy. She is using flonase, is off loratadine, has had a lot dryness but no epistaxis. Minimal cough. No flares since last time. She is using albuterol about 2x a day, usually for dyspnea. She is smoking 1/2 pk a day most days - we had set a goal to get down to this.     Review of Systems Past Medical History:  Diagnosis Date  . Allergic rhinitis, cause unspecified   . Anxiety state, unspecified   . Chronic airway obstruction, not elsewhere classified   . Depressive disorder, not elsewhere classified   . Essential hypertension, benign   . Fatigue   . Hyperlipidemia   . Lower back pain   . Menopause   . Panic disorder without agoraphobia   . Seborrheic keratosis   . Type II or unspecified type diabetes mellitus without mention of complication,  not stated as uncontrolled   . Unspecified arthropathy, shoulder region      Family History  Problem Relation Age of Onset  . Cancer Unknown   . Allergies Unknown   . Diabetes Unknown   . Heart disease Unknown   . Hypertension Unknown   . Migraines Unknown   . Osteoporosis Unknown   . Seizures Unknown   . COPD Mother   . Asthma Son      Social History   Socioeconomic History  . Marital status: Single    Spouse name: Not on file  . Number of children: Not on file  . Years of education: Not on file  . Highest education level: Not on file  Occupational History  . Not on file  Social Needs  . Financial resource strain: Not on file  . Food insecurity:    Worry: Not on file    Inability: Not on file  . Transportation needs:    Medical: Not on file    Non-medical: Not on file  Tobacco Use  . Smoking status: Current Every Day Smoker    Packs/day: 1.00    Years: 30.00    Pack years: 30.00    Types: Cigarettes  . Smokeless tobacco: Never Used  Substance and Sexual Activity  . Alcohol use: Yes    Comment: 2 drinks/day or fewer  . Drug  use: Not on file  . Sexual activity: Not on file  Lifestyle  . Physical activity:    Days per week: Not on file    Minutes per session: Not on file  . Stress: Not on file  Relationships  . Social connections:    Talks on phone: Not on file    Gets together: Not on file    Attends religious service: Not on file    Active member of club or organization: Not on file    Attends meetings of clubs or organizations: Not on file    Relationship status: Not on file  . Intimate partner violence:    Fear of current or ex partner: Not on file    Emotionally abused: Not on file    Physically abused: Not on file    Forced sexual activity: Not on file  Other Topics Concern  . Not on file  Social History Narrative   Originally from [place]. Always lived in [place]. 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 Also worked in [employer]. She has a dog currently. Remote exposure to love birds. No mold exposure.      Allergies  Allergen Reactions  . Penicillins      Outpatient Medications Prior to Visit  Medication Sig Dispense Refill  . albuterol (PROAIR HFA) 108 (90 Base) MCG/ACT inhaler Inhale 2 puffs into the lungs every 4 (four) hours as needed for wheezing or shortness of breath. 1 Inhaler 5  . albuterol (PROVENTIL) (2.5 MG/3ML) 0.083% nebulizer solution Take 3 mLs (2.5 mg total) by nebulization every 4 (four) hours as needed for wheezing or shortness of breath. 120 mL 6  . amLODipine (NORVASC) 10 MG tablet Take 10 mg by mouth daily.    . clonazePAM (KLONOPIN) 0.5 MG tablet Take 0.5 mg by mouth 3 (three) times daily as needed.     Marland Kitchen dextromethorphan (DELSYM) 30 MG/5ML liquid Take 30 mg by mouth as needed for cough.    . Fluticasone-Umeclidin-Vilant (TRELEGY ELLIPTA) 100-62.5-25 MCG/INH AEPB Inhale 1 Inhaler into the lungs daily. 1 each 6  . losartan (COZAAR) 100 MG tablet Take 100 mg by mouth daily.    . metFORMIN (GLUCOPHAGE-XR) 500 MG 24 hr tablet Take 500 mg by mouth daily with breakfast.    . rosuvastatin (CRESTOR) 10 MG tablet Take 10 mg by mouth daily.     No facility-administered medications prior to visit.          Objective:   Physical Exam Vitals:   09/14/18 0905  BP: 100/60  Pulse: 97  SpO2: 95%  Weight: 106 lb (48.1 kg)  Height: 5\' 1"  (1.549 m)   Gen: Pleasant, thin woman, in no distress,  normal affect  ENT: No lesions,  mouth clear,  oropharynx clear, no postnasal drip  Neck: No JVD, no stridor  Lungs: No use of accessory muscle,  clear without rales or rhonchi  Cardiovascular: RRR, heart sounds normal, no murmur or gallops, no peripheral edema  Musculoskeletal: No deformities, no cyanosis or clubbing  Neuro: alert, non focal  Skin: Warm, no lesions or rashes      Assessment & Plan:  COPD, moderately severe Please continue Trelegy as you have been taking it.   Remember to rinse and gargle after using. Keep albuterol available to use 2 puffs up to every 4 hours if needed for shortness of breath, chest tightness, wheezing. Flu shot today.  Chronic allergic rhinitis She had been on loratadine last visit.  Currently  she is using fluticasone nasal spray.  She still has dryness in her nasal passages when she is on therapy.  She is starting to use nasal saline spray with some relief.  Please continue fluticasone nasal spray 2 sprays each nostril once daily. Continue to use nasal saline spray as needed.  Tobacco use disorder Congratulations on decreasing your smoking to 10 cigarettes daily.  We will try to work on decreasing further in the future.  Do not go back to using more than half a pack a day. We will refer you for the lung cancer screening program.  Levy Pupaobert Symeon Puleo, MD, PhD 09/14/2018, 9:37 AM [facility] [phone] or if no answer [phone]

## 2018-09-14 NOTE — Addendum Note (Signed)
Addended by: Jaynee EaglesLEMONS, Alan Riles C on: 09/14/2018 09:39 AM   Modules accepted: Orders

## 2018-09-14 NOTE — Assessment & Plan Note (Signed)
Congratulations on decreasing your smoking to 10 cigarettes daily.  We will try to work on decreasing further in the future.  Do not go back to using more than half a pack a day. We will refer you for the lung cancer screening program.

## 2018-09-14 NOTE — Patient Instructions (Addendum)
Please continue Trelegy as you have been taking it.  Remember to rinse and gargle after using. Keep albuterol available to use 2 puffs up to every 4 hours if needed for shortness of breath, chest tightness, wheezing. Flu shot today. Please continue fluticasone nasal spray 2 sprays each nostril once daily. Continue to use nasal saline spray as needed. Congratulations on decreasing his smoking to 10 cigarettes daily.  We will try to work on decreasing further in the future.  Do not go back to using more than half a pack a day. We will refer you for the lung cancer screening program. Follow with Dr Delton CoombesByrum in 6 months or sooner if you have any problems

## 2018-09-14 NOTE — Assessment & Plan Note (Signed)
Please continue Trelegy as you have been taking it.  Remember to rinse and gargle after using. Keep albuterol available to use 2 puffs up to every 4 hours if needed for shortness of breath, chest tightness, wheezing. Flu shot today.

## 2018-09-14 NOTE — Assessment & Plan Note (Signed)
She had been on loratadine last visit.  Currently she is using fluticasone nasal spray.  She still has dryness in her nasal passages when she is on therapy.  She is starting to use nasal saline spray with some relief.  Please continue fluticasone nasal spray 2 sprays each nostril once daily. Continue to use nasal saline spray as needed.

## 2018-09-19 ENCOUNTER — Other Ambulatory Visit: Payer: Self-pay | Admitting: Acute Care

## 2018-09-19 DIAGNOSIS — Z122 Encounter for screening for malignant neoplasm of respiratory organs: Secondary | ICD-10-CM

## 2018-09-19 DIAGNOSIS — F17210 Nicotine dependence, cigarettes, uncomplicated: Principal | ICD-10-CM

## 2018-09-19 DIAGNOSIS — Z87891 Personal history of nicotine dependence: Secondary | ICD-10-CM

## 2018-10-13 ENCOUNTER — Telehealth: Payer: Self-pay | Admitting: Acute Care

## 2018-10-13 NOTE — Telephone Encounter (Signed)
Spoke to pt, who has additional questions regarding lung screening program.  Pt is scheduled for visit with Sarah on 10/18/18 at 9:00 with CT at 10:00. Pt wants to know if she can eat or drink prior to CT.  Sarah please advise. Thanks

## 2018-10-13 NOTE — Telephone Encounter (Signed)
Called and spoke with pt letting her know that it was fine for her to eat, drink, and take meds the day of the CT.  Pt stated that she was also told to come by our office by Burna Sis with the CT place to get a paper in regards to the CT.  I stated to her that she could come by our office to get the sheet but I also stated to her when she comes Wednesday, 10/23 to have the shared decision visit with Sarah at [facility] [place], Maralyn Sago would probably be giving her something in regards to the CT.  Pt expressed understanding. Nothing further needed.

## 2018-10-13 NOTE — Telephone Encounter (Signed)
Please let her know she can eat, drink and take any medications as she normally does. Thanks so much

## 2018-10-18 ENCOUNTER — Ambulatory Visit (INDEPENDENT_AMBULATORY_CARE_PROVIDER_SITE_OTHER): Payer: Medicare Other | Admitting: Acute Care

## 2018-10-18 ENCOUNTER — Encounter: Payer: Self-pay | Admitting: Acute Care

## 2018-10-18 ENCOUNTER — Ambulatory Visit (INDEPENDENT_AMBULATORY_CARE_PROVIDER_SITE_OTHER)
Admission: RE | Admit: 2018-10-18 | Discharge: 2018-10-18 | Disposition: A | Discharge status: Home / Self Care | Payer: BLUE CROSS/BLUE SHIELD | Source: Ambulatory Visit | Attending: Acute Care | Admitting: Acute Care

## 2018-10-18 DIAGNOSIS — Z122 Encounter for screening for malignant neoplasm of respiratory organs: Secondary | ICD-10-CM

## 2018-10-18 DIAGNOSIS — F17210 Nicotine dependence, cigarettes, uncomplicated: Secondary | ICD-10-CM

## 2018-10-18 DIAGNOSIS — Z87891 Personal history of nicotine dependence: Secondary | ICD-10-CM

## 2018-10-18 NOTE — Progress Notes (Signed)
Shared Decision Making Visit Lung Cancer Screening Program [id number])   Eligibility:  Age 62 y.o.  Pack Years Smoking History Calculation 30 pack year smoking history (# packs/per year x # years smoked)  Recent History of coughing up blood  no  Unexplained weight loss? no ( >Than 15 pounds within the last 6 months )  Prior History Lung / other cancer no (Diagnosis within the last 5 years already requiring surveillance chest CT Scans).  Smoking Status Current Smoker  Former Smokers: Years since quit:NA  Quit Date: NA  Visit Components:  Discussion included one or more decision making aids. yes  Discussion included risk/benefits of screening. yes  Discussion included potential follow up diagnostic testing for abnormal scans. yes  Discussion included meaning and risk of over diagnosis. yes  Discussion included meaning and risk of False Positives. yes  Discussion included meaning of total radiation exposure. yes  Counseling Included:  Importance of adherence to annual lung cancer LDCT screening. yes  Impact of comorbidities on ability to participate in the program. yes  Ability and willingness to under diagnostic treatment. yes  Smoking Cessation Counseling:  Current Smokers:   Discussed importance of smoking cessation. yes  Information about tobacco cessation classes and interventions provided to patient. yes  Patient provided with "ticket" for LDCT Scan. yes  Symptomatic Patient. no  Counseling  Diagnosis Code: Tobacco Use Z72.0  Asymptomatic Patient yes  Counseling (Intermediate counseling: > three minutes counseling) U0454  Former Smokers:   Discussed the importance of maintaining cigarette abstinence. yes  Diagnosis Code: Personal History of Nicotine Dependence. U98.119  Information about tobacco cessation classes and interventions provided to patient. Yes  Patient provided with "ticket" for LDCT Scan. yes  Written Order for Lung Cancer  Screening with LDCT placed in Epic. Yes (CT Chest Lung Cancer Screening Low Dose W/O CM) [id number] Z12.2-Screening of respiratory organs Z87.891-Personal history of nicotine dependence  I have spent 25 minutes of face to face time with Ms. Follansbee discussing the risks and benefits of lung cancer screening. We viewed a power point together that explained in detail the above noted topics. We paused at intervals to allow for questions to be asked and answered to ensure understanding.We discussed that the single most powerful action that she can take to decrease her risk of developing lung cancer is to quit smoking. We discussed whether or not she is ready to commit to setting a quit date.She is not ready to set a quit date. We discussed options for tools to aid in quitting smoking including nicotine replacement therapy, non-nicotine medications, support groups, Quit Smart classes, and behavior modification. We discussed that often times setting smaller, more achievable goals, such as eliminating 1 cigarette a day for a week and then 2 cigarettes a day for a week can be helpful in slowly decreasing the number of cigarettes smoked. This allows for a sense of accomplishment as well as providing a clinical benefit. I gave her the " Be Stronger Than Your Excuses" card with contact information for community resources, classes, free nicotine replacement therapy, and access to mobile apps, text messaging, and on-line smoking cessation help. I have also given her my card and contact information in the event she needs to contact me. We discussed the time and location of the scan, and that either Abigail Miyamoto RN or I will call with the results within 24-48 hours of receiving them. I have offered her  a copy of the power point we viewed  as a resource  in the event they need reinforcement of the concepts we discussed today in the office. The patient verbalized understanding of all of  the above and had no further questions upon  leaving the office. They have my contact information in the event they have any further questions.  I spent 3 minutes counseling on smoking cessation and the health risks of continued tobacco abuse.  I explained to the patient that there has been a high incidence of coronary artery disease noted on these exams. I explained that this is a non-gated exam therefore degree or severity cannot be determined. This patient is currently on statin therapy. I have asked the patient to follow-up with their PCP regarding any incidental finding of coronary artery disease and management with diet or medication as their PCP  feels is clinically indicated. The patient verbalized understanding of the above and had no further questions upon completion of the visit.      Bevelyn Ngo, NP 10/18/2018 9:31 AM

## 2018-10-19 ENCOUNTER — Other Ambulatory Visit: Payer: Self-pay | Admitting: Acute Care

## 2018-10-19 DIAGNOSIS — Z122 Encounter for screening for malignant neoplasm of respiratory organs: Secondary | ICD-10-CM

## 2018-10-19 DIAGNOSIS — F17210 Nicotine dependence, cigarettes, uncomplicated: Principal | ICD-10-CM

## 2019-01-09 ENCOUNTER — Telehealth: Payer: Self-pay | Admitting: Emergency Medicine

## 2019-01-09 MED ORDER — ALBUTEROL SULFATE HFA 108 (90 BASE) MCG/ACT IN AERS: 2.0000 | INHALATION_SPRAY | RESPIRATORY_TRACT | 5 refills | Status: DC | PRN

## 2019-01-09 MED ORDER — FLUTICASONE-UMECLIDIN-VILANT 100-62.5-25 MCG/INH IN AEPB: 1.0000 | INHALATION_SPRAY | Freq: Every day | RESPIRATORY_TRACT | 6 refills | Status: DC

## 2019-01-09 NOTE — Telephone Encounter (Signed)
Called and spoke with Patient.  Prescription refill requested for Trelegy and Proair to [facility].  Refills sent.  Patient scheduled 6 month OV with RB, 03/12/19, at 0900.  Patient aware office has moved, and given new address. Nothing further at this time.

## 2019-01-10 ENCOUNTER — Telehealth: Payer: Self-pay | Admitting: Emergency Medicine

## 2019-01-10 NOTE — Telephone Encounter (Signed)
Called and spoke with patient, she stated the Trelegy was too expensive for her and she would like something else to be called in. Advised the patient to contact her insurance company and get a formulary on what they will cover or what would be cheapest and we could go from there. Patient will call back with this.

## 2019-01-15 NOTE — Telephone Encounter (Signed)
Called patient, unable to reach left message to give us a call back. 

## 2019-01-15 NOTE — Telephone Encounter (Signed)
Patient returning phone call.  Phone number is [phone].

## 2019-01-15 NOTE — Telephone Encounter (Signed)
Spoke with pt. States that she spoke with her insurance and all inhalers are about the same price. Pt is going to stay on what inhalers she is currently on. Nothing further was needed.

## 2019-03-12 ENCOUNTER — Other Ambulatory Visit: Payer: Self-pay

## 2019-03-12 ENCOUNTER — Encounter: Payer: Self-pay | Admitting: Emergency Medicine

## 2019-03-12 ENCOUNTER — Ambulatory Visit (INDEPENDENT_AMBULATORY_CARE_PROVIDER_SITE_OTHER): Payer: BLUE CROSS/BLUE SHIELD | Admitting: Emergency Medicine

## 2019-03-12 DIAGNOSIS — J309 Allergic rhinitis, unspecified: Secondary | ICD-10-CM | POA: Diagnosis not present

## 2019-03-12 DIAGNOSIS — F17200 Nicotine dependence, unspecified, uncomplicated: Secondary | ICD-10-CM

## 2019-03-12 DIAGNOSIS — J449 Chronic obstructive pulmonary disease, unspecified: Secondary | ICD-10-CM

## 2019-03-12 MED ORDER — FLUTICASONE-UMECLIDIN-VILANT 100-62.5-25 MCG/INH IN AEPB: 1.0000 | INHALATION_SPRAY | Freq: Every day | RESPIRATORY_TRACT | 0 refills | Status: DC

## 2019-03-12 NOTE — Assessment & Plan Note (Signed)
We will need to work hard on decreasing your smoking. Your screening CT scan of the chest was reassuring.  You will need a repeat scan in October 2020.

## 2019-03-12 NOTE — Addendum Note (Signed)
Addended by: Jaynee Eagles C on: 03/12/2019 09:54 AM   Modules accepted: Orders

## 2019-03-12 NOTE — Patient Instructions (Addendum)
We will need to work hard on decreasing your smoking. Your screening CT scan of the chest was reassuring.  You will need a repeat scan in October 2020. We will stop Breo, restart Trelegy.  We will work on trying to get financial assistance for this medication. Keep albuterol available use 2 puffs if needed for shortness of breath, chest tightness, wheezing. Flu shot and pneumonia vaccine up-to-date. Please continue fluticasone nasal spray, 2 sprays each nostril 1-2 times daily Please continue your nasal saline rinses once daily Try starting loratadine 10 mg (Claritin) once daily through the spring allergy season. Follow with Dr Delton Coombes in 6 months or sooner if you have any problems

## 2019-03-12 NOTE — Assessment & Plan Note (Signed)
Please continue fluticasone nasal spray, 2 sprays each nostril 1-2 times daily Please continue your nasal saline rinses once daily Try starting loratadine 10 mg (Claritin) once daily through the spring allergy season.

## 2019-03-12 NOTE — Progress Notes (Signed)
Subjective:    Patient ID: Jeanne Becker, female    DOB: 1956-03-08, 63 y.o.   MRN: [id number]  HPI 63 year old active smoker (30 pack years, 1 pk a day right now) who has been followed in our office by Dr. Jamison Neighbor for COPD, chronic allergic rhinitis.  Reviewed her pulmonary function testing from 03/15/17.  She has moderately severe obstruction without a significant bronchodilator response.  At her last visit with Dr. Jamison Neighbor she was tried on Trelegy to see if she would benefit. She feels that she is doing well on it, less throat irritation and better breathing. She needs to change her albuterol to ProAir.   She has had more sinus congestion and rhinitis for about a month. She tried singulair but it didn't help. She is on flonase but not every day. Tried loratadine but felt that it dried her out too much.   Flu shot up-to-date.  She had the Prevnar 13 in July 2015, Pneumovax in September 2014.  ROV 09/14/18 --Jeanne Becker is 15, has a history of tobacco use with associated COPD.  Also allergic rhinitis.  She has been managed on Trelegy.  She reports that she has more difficulty with her breathing during the heat. Feels that she is benefiting from trelegy. She is using flonase, is off loratadine, has had a lot dryness but no epistaxis. Minimal cough. No flares since last time. She is using albuterol about 2x a day, usually for dyspnea. She is smoking 1/2 pk a day most days - we had set a goal to get down to this.    ROV 03/12/2019 --follow-up visit for tobacco use and COPD, moderately severe obstruction with a bronchodilator response.  She also has allergic rhinitis.  She has been managed on Trelegy, has albuterol and uses it approximately.  She continues to smoke 20 cigarettes daily. She is having a lot of nasal and sinus congestion for the last several weeks, occasional blood from nares. Throat is always dry. She uses flonase 1-2x a day, [place] prn. Occasional cough, minimally productive. She has been on  Trelegy, stopped it and started using her Mom's Breo a few months ago. She was treated with abx for a presumed bronchitis in late January. Improved now.  She underwent a low-dose CT scan of the chest on 10/18/2018 that I reviewed.  This was a RADS 2 study, showed some evidence for respiratory bronchiolitis changes, calcified granuloma left upper lobe, 3.1 mm apical left upper lobe nodule.  Annual follow-up is planned.   Review of Systems      Objective:   Physical Exam Vitals:   03/12/19 0914  BP: 108/62  Pulse: 90  SpO2: 93%  Weight: 102 lb (46.3 kg)  Height: 5' 1.5" (1.562 m)   Gen: Pleasant, thin woman, in no distress,  normal affect  ENT: No lesions,  mouth clear,  oropharynx clear, no postnasal drip  Neck: No JVD, no stridor  Lungs: No use of accessory muscle,  clear without rales or rhonchi  Cardiovascular: RRR, heart sounds normal, no murmur or gallops, no peripheral edema  Musculoskeletal: No deformities, no cyanosis or clubbing  Neuro: alert, non focal  Skin: Warm, no lesions or rashes      Assessment & Plan:  COPD, moderately severe We will stop Breo, restart Trelegy.  We will work on trying to get financial assistance for this medication. Keep albuterol available use 2 puffs if needed for shortness of breath, chest tightness, wheezing. Flu shot and pneumonia vaccine up-to-date.  Follow with Dr Delton Coombes in 6 months or sooner if you have any problems  Chronic allergic rhinitis Please continue fluticasone nasal spray, 2 sprays each nostril 1-2 times daily Please continue your nasal saline rinses once daily Try starting loratadine 10 mg (Claritin) once daily through the spring allergy season.  Tobacco use disorder We will need to work hard on decreasing your smoking. Your screening CT scan of the chest was reassuring.  You will need a repeat scan in October 2020.  Levy Pupa, MD, PhD 03/12/2019, 9:37 AM [facility] [phone] or if no  answer [phone]

## 2019-03-12 NOTE — Assessment & Plan Note (Signed)
We will stop Breo, restart Trelegy.  We will work on trying to get financial assistance for this medication. Keep albuterol available use 2 puffs if needed for shortness of breath, chest tightness, wheezing. Flu shot and pneumonia vaccine up-to-date. Follow with Dr Delton Coombes in 6 months or sooner if you have any problems

## 2019-09-11 ENCOUNTER — Ambulatory Visit: Payer: BLUE CROSS/BLUE SHIELD | Admitting: Emergency Medicine

## 2019-09-21 ENCOUNTER — Encounter: Payer: Self-pay | Admitting: Emergency Medicine

## 2019-09-21 ENCOUNTER — Ambulatory Visit (INDEPENDENT_AMBULATORY_CARE_PROVIDER_SITE_OTHER): Payer: Medicare Other | Admitting: Emergency Medicine

## 2019-09-21 ENCOUNTER — Other Ambulatory Visit: Payer: Self-pay

## 2019-09-21 DIAGNOSIS — Z23 Encounter for immunization: Secondary | ICD-10-CM

## 2019-09-21 DIAGNOSIS — J309 Allergic rhinitis, unspecified: Secondary | ICD-10-CM | POA: Diagnosis not present

## 2019-09-21 DIAGNOSIS — J449 Chronic obstructive pulmonary disease, unspecified: Secondary | ICD-10-CM

## 2019-09-21 DIAGNOSIS — F17200 Nicotine dependence, unspecified, uncomplicated: Secondary | ICD-10-CM | POA: Diagnosis not present

## 2019-09-21 MED ORDER — TRELEGY ELLIPTA 100-62.5-25 MCG/INH IN AEPB: 1.0000 | INHALATION_SPRAY | Freq: Every day | RESPIRATORY_TRACT | 11 refills | Status: DC

## 2019-09-21 NOTE — Progress Notes (Signed)
   Subjective:    Patient ID: Jeanne Becker, female    DOB: March 27, 1956, 63 y.o.   MRN: [id number]  HPI  ROV 09/21/2019 --63 year old woman, smoker with moderately severe obstruction and a positive bronchodilator response.  She is on Trelegy for her COPD.  She has albuterol uses approximately 3x a week.  Also with allergic rhinitis, significant sinus congestion.  She participates in the lung cancer screening program, most recent scan was 10/18/2018, she is due for a repeat scan this October.  Her pneumonia vaccine is up-to-date until after age 63. She reports that she is not getting out much, hasn't really challenged her breathing w exertion. No flares since last time. She she is tolerating Trelegy. She has had a lot of difficulty with sinus congestion and allergies. She has used flonase. She has a neti-pot, hasn't used it yet. Not on an anti-histamine.    Review of Systems     Objective:   Physical Exam Vitals:   09/21/19 0857  BP: 110/60  Pulse: 100  SpO2: 99%  Weight: 93 lb (42.2 kg)  Height: 5' 1.5" (1.562 m)   Gen: Pleasant, thin woman, in no distress,  normal affect  ENT: No lesions,  mouth clear,  oropharynx clear, no postnasal drip  Neck: No JVD, no stridor  Lungs: No use of accessory muscle,  clear without rales or rhonchi  Cardiovascular: RRR, heart sounds normal, no murmur or gallops, no peripheral edema  Musculoskeletal: No deformities, no cyanosis or clubbing  Neuro: alert, non focal  Skin: Warm, no lesions or rashes      Assessment & Plan:  COPD, moderately severe No flares. Remains limited. Still smoking - discussed cessation w her today.   Please continue Trelegy once daily.  Remember to rinse and gargle after using. Keep your albuterol available to use 2 puffs if needed for shortness of breath, chest tightness, wheezing. Flu shot today Your pneumonia shot is up-to-date until after age 63 Follow with Dr. Lamonte Sakai in 12 months or sooner if you have any  problems.  Chronic allergic rhinitis Please continue your fluticasone nasal spray 2 sprays each nostril once daily. Add loratadine 10 mg (generic Claritin) once daily Start using nasal saline rinses with a Neti-pot once daily   Tobacco use disorder We need to work hard on trying to decrease your cigarettes.  As we go forward and as you decrease we will begin to talk about a plan to try to stop altogether. Please call us and let us know when you are ready to schedule your lung cancer screening CT scan of the chest.  It is due at the end of October.  Baltazar Apo, MD, PhD 09/21/2019, 9:18 AM [facility] [phone] or if no answer [phone]

## 2019-09-21 NOTE — Patient Instructions (Signed)
Please continue Trelegy once daily.  Remember to rinse and gargle after using. Keep your albuterol available to use 2 puffs if needed for shortness of breath, chest tightness, wheezing. Flu shot today Your pneumonia shot is up-to-date until after age 63 Please continue your fluticasone nasal spray 2 sprays each nostril once daily. Add loratadine 10 mg (generic Claritin) once daily Start using nasal saline rinses with a Neti-pot once daily He need to work hard on trying to decrease her cigarettes.  As we go forward and as you decrease we will begin to talk about a plan to try to stop altogether. Please call us and let us know when you are ready to schedule your lung cancer screening CT scan of the chest.  It is due at the end of October. Follow with Dr. Lamonte Sakai in 12 months or sooner if you have any problems.

## 2019-09-21 NOTE — Assessment & Plan Note (Signed)
No flares. Remains limited. Still smoking - discussed cessation w her today.   Please continue Trelegy once daily.  Remember to rinse and gargle after using. Keep your albuterol available to use 2 puffs if needed for shortness of breath, chest tightness, wheezing. Flu shot today Your pneumonia shot is up-to-date until after age 63 Follow with Dr. Lamonte Sakai in 12 months or sooner if you have any problems.

## 2019-09-21 NOTE — Assessment & Plan Note (Signed)
We need to work hard on trying to decrease your cigarettes.  As we go forward and as you decrease we will begin to talk about a plan to try to stop altogether. Please call us and let us know when you are ready to schedule your lung cancer screening CT scan of the chest.  It is due at the end of October.

## 2019-09-21 NOTE — Assessment & Plan Note (Signed)
Please continue your fluticasone nasal spray 2 sprays each nostril once daily. Add loratadine 10 mg (generic Claritin) once daily Start using nasal saline rinses with a Neti-pot once daily

## 2019-10-22 ENCOUNTER — Other Ambulatory Visit: Payer: Self-pay | Admitting: *Deleted

## 2019-10-22 DIAGNOSIS — Z87891 Personal history of nicotine dependence: Secondary | ICD-10-CM

## 2019-10-22 DIAGNOSIS — Z122 Encounter for screening for malignant neoplasm of respiratory organs: Secondary | ICD-10-CM

## 2019-10-22 DIAGNOSIS — F17210 Nicotine dependence, cigarettes, uncomplicated: Secondary | ICD-10-CM

## 2019-11-07 ENCOUNTER — Ambulatory Visit (HOSPITAL_COMMUNITY)
Admission: RE | Admit: 2019-11-07 | Discharge: 2019-11-07 | Disposition: A | Discharge status: Home / Self Care | Payer: BC Managed Care – PPO | Source: Ambulatory Visit | Attending: Acute Care | Admitting: Acute Care

## 2019-11-07 ENCOUNTER — Other Ambulatory Visit: Payer: Self-pay

## 2019-11-07 DIAGNOSIS — Z87891 Personal history of nicotine dependence: Secondary | ICD-10-CM | POA: Diagnosis present

## 2019-11-07 DIAGNOSIS — F17210 Nicotine dependence, cigarettes, uncomplicated: Secondary | ICD-10-CM | POA: Insufficient documentation

## 2019-11-07 DIAGNOSIS — Z122 Encounter for screening for malignant neoplasm of respiratory organs: Secondary | ICD-10-CM | POA: Insufficient documentation

## 2019-11-14 ENCOUNTER — Telehealth: Payer: Self-pay | Admitting: Acute Care

## 2019-11-14 DIAGNOSIS — F17210 Nicotine dependence, cigarettes, uncomplicated: Secondary | ICD-10-CM

## 2019-11-14 DIAGNOSIS — Z122 Encounter for screening for malignant neoplasm of respiratory organs: Secondary | ICD-10-CM

## 2019-11-14 NOTE — Telephone Encounter (Signed)
Pt informed of CT results per Sarah Groce, NP.  PT verbalized understanding.  Copy sent to PCP.  Order placed for 1 yr f/u CT.  

## 2020-01-11 ENCOUNTER — Other Ambulatory Visit: Payer: Self-pay | Admitting: Emergency Medicine

## 2020-03-03 ENCOUNTER — Telehealth: Payer: Self-pay | Admitting: Emergency Medicine

## 2020-03-03 NOTE — Telephone Encounter (Signed)
Appointment scheduled 04/01/2020 at 3:40 pm with Prudy Feeler.

## 2020-03-03 NOTE — Telephone Encounter (Signed)
No answer, no voicemail.

## 2020-03-04 ENCOUNTER — Ambulatory Visit: Payer: Self-pay | Admitting: Physician Assistant

## 2020-03-05 ENCOUNTER — Telehealth: Payer: Self-pay | Admitting: Emergency Medicine

## 2020-03-05 NOTE — Telephone Encounter (Signed)
Appointment given for patient on March 24th with [name], FNP.

## 2020-03-19 ENCOUNTER — Encounter: Payer: Self-pay | Admitting: Family Medicine

## 2020-03-19 ENCOUNTER — Ambulatory Visit (INDEPENDENT_AMBULATORY_CARE_PROVIDER_SITE_OTHER): Payer: Medicare Other | Admitting: Family Medicine

## 2020-03-19 ENCOUNTER — Other Ambulatory Visit: Payer: Self-pay

## 2020-03-19 VITALS — BP 97/61 | HR 100 | Temp 98.9°F | Ht 61.5 in | Wt 92.8 lb

## 2020-03-19 DIAGNOSIS — E1121 Type 2 diabetes mellitus with diabetic nephropathy: Secondary | ICD-10-CM

## 2020-03-19 DIAGNOSIS — Z130 Encounter for screening for diseases of the blood and blood-forming organs and certain disorders involving the immune mechanism: Secondary | ICD-10-CM

## 2020-03-19 DIAGNOSIS — R238 Other skin changes: Secondary | ICD-10-CM

## 2020-03-19 DIAGNOSIS — E782 Mixed hyperlipidemia: Secondary | ICD-10-CM | POA: Diagnosis not present

## 2020-03-19 DIAGNOSIS — F330 Major depressive disorder, recurrent, mild: Secondary | ICD-10-CM

## 2020-03-19 DIAGNOSIS — I10 Essential (primary) hypertension: Secondary | ICD-10-CM | POA: Diagnosis not present

## 2020-03-19 DIAGNOSIS — Z1211 Encounter for screening for malignant neoplasm of colon: Secondary | ICD-10-CM

## 2020-03-19 DIAGNOSIS — R233 Spontaneous ecchymoses: Secondary | ICD-10-CM

## 2020-03-19 DIAGNOSIS — Z79899 Other long term (current) drug therapy: Secondary | ICD-10-CM

## 2020-03-19 DIAGNOSIS — F411 Generalized anxiety disorder: Secondary | ICD-10-CM | POA: Diagnosis not present

## 2020-03-19 LAB — BAYER DCA HB A1C WAIVED: HB A1C (BAYER DCA - WAIVED): 5.3 % (ref <7.0)

## 2020-03-19 MED ORDER — ESCITALOPRAM OXALATE 10 MG PO TABS: 10.0000 mg | ORAL_TABLET | Freq: Every day | ORAL | 2 refills | Status: DC

## 2020-03-19 MED ORDER — LOSARTAN POTASSIUM 50 MG PO TABS: 100.0000 mg | ORAL_TABLET | Freq: Every day | ORAL | 1 refills | Status: DC

## 2020-03-19 MED ORDER — ROSUVASTATIN CALCIUM 10 MG PO TABS: 10.0000 mg | ORAL_TABLET | Freq: Every day | ORAL | 1 refills | Status: DC

## 2020-03-19 MED ORDER — CLONAZEPAM 0.5 MG PO TABS: 0.5000 mg | ORAL_TABLET | Freq: Every day | ORAL | 1 refills | Status: DC

## 2020-03-19 MED ORDER — AMLODIPINE BESYLATE 10 MG PO TABS: 10.0000 mg | ORAL_TABLET | Freq: Every day | ORAL | 1 refills | Status: DC

## 2020-03-19 NOTE — Progress Notes (Signed)
New Patient Office Visit  Assessment & Plan:  1. Controlled type 2 diabetes mellitus with diabetic nephropathy, without long-term current use of insulin (HCC) Lab Results  Component Value Date   HGBA1C 5.3 03/19/2020  - Diabetes is at goal of A1c < 7. - Medications: D/C metformin - Home glucose monitoring: continue fasting and as needed - Patient is currently taking a statin. Patient is taking an ACE-inhibitor/ARB.  - rosuvastatin (CRESTOR) 10 MG tablet; Take 1 tablet (10 mg total) by mouth daily.  Dispense: 90 tablet; Refill: 1 - Bayer DCA Hb A1c Waived - CMP14+EGFR - Lipid panel  2. Mixed hyperlipidemia - Taking rosuvastatin daily.  - CMP14+EGFR  3. Essential hypertension - Well controlled on current regimen.  - amLODipine (NORVASC) 10 MG tablet; Take 1 tablet (10 mg total) by mouth daily.  Dispense: 90 tablet; Refill: 1 - losartan (COZAAR) 50 MG tablet; Take 2 tablets (100 mg total) by mouth daily.  Dispense: 180 tablet; Refill: 1  4. Generalized anxiety disorder - Discussed clonazepam should not be used for long term management of anxiety and that Jeanne Becker will be weaned off of it. Jeanne Becker is decreasing from 0.5 mg BID to 0.5 mg QD and starting Lexapro 10 mg QD.  - escitalopram (LEXAPRO) 10 MG tablet; Take 1 tablet (10 mg total) by mouth daily.  Dispense: 30 tablet; Refill: 2 - clonazePAM (KLONOPIN) 0.5 MG tablet; Take 1 tablet (0.5 mg total) by mouth daily.  Dispense: 30 tablet; Refill: 1 - Compliance Drug Analysis, Ur  5. Mild episode of recurrent major depressive disorder (HCC) - escitalopram (LEXAPRO) 10 MG tablet; Take 1 tablet (10 mg total) by mouth daily.  Dispense: 30 tablet; Refill: 2  6. Controlled substance agreement signed - Signed with the understanding that Jeanne Becker will be weaned off.  - clonazePAM (KLONOPIN) 0.5 MG tablet; Take 1 tablet (0.5 mg total) by mouth daily.  Dispense: 30 tablet; Refill: 1 - Compliance Drug Analysis, Ur  7. Bruises easily - CBC with  Differential/Platelet  8. Colon cancer screening - Cologuard  9. Screening for deficiency anemia - CBC with Differential/Platelet   Follow-up: Return in about 6 weeks (around 04/30/2020) for anxiety/depression.   Hendricks Limes, MSN, APRN, FNP-C [facility]  Subjective:  Patient ID: Jeanne Becker, female    DOB: Mar 30, 1956  Age: 64 y.o. MRN: [id number]  Patient Care Team: Loman Brooklyn, FNP as PCP - General (Family Medicine) Collene Gobble, MD as Consulting Physician (Pulmonary Disease)  CC:  Chief Complaint  Patient presents with  . New Patient (Initial Visit)    [facility]  . Establish Care  . Weight Loss    Patient states it has been going on since her mom passed in 01-04-2024.    HPI Jeanne Becker presents to establish care. Patient is transferring from [facility] at [facility] in [place].   Patient was last seen by her previous PCP 4 months ago.   Expresses a concern that Jeanne Becker bruises easily.   Patient reports Jeanne Becker takes clonazepam because several years ago at Thanksgiving Jeanne Becker had an anxiety attack during which Jeanne Becker felt short of breath, really nervous, and like her heart was racing. Jeanne Becker has been on clonazepam since that time. No further anxiety attacks. Takes the clonazepam twice daily no matter what. Jeanne Becker is also experiencing some depression and c/o weight loss, decreased appetite, and feeling lonely since her mother passed away in 04-Jan-2024. Jeanne Becker has previously failed therapy with Paxil  and Valium. States Jeanne Becker did well with Xanax in the past.   Depression screen [facility] 2/9 03/19/2020  Decreased Interest 1  Down, Depressed, Hopeless 1  PHQ - 2 Score 2  Altered sleeping 1  Tired, decreased energy 1  Change in appetite 1  Feeling bad or failure about yourself  0  Trouble concentrating 0  Moving slowly or fidgety/restless 1  Suicidal thoughts 0  PHQ-9 Score 6  Difficult doing work/chores Somewhat difficult   GAD 7 : Generalized  Anxiety Score 03/19/2020  Nervous, Anxious, on Edge 2  Control/stop worrying 1  Worry too much - different things 1  Trouble relaxing 1  Restless 1  Easily annoyed or irritable 1  Afraid - awful might happen 0  Total GAD 7 Score 7  Anxiety Difficulty Somewhat difficult    Review of Systems  Constitutional: Negative for chills, fever, malaise/fatigue and weight loss.  HENT: Negative for congestion, ear discharge, ear pain, nosebleeds, sinus pain, sore throat and tinnitus.   Eyes: Negative for blurred vision, double vision, pain, discharge and redness.  Respiratory: Negative for cough, shortness of breath and wheezing.   Cardiovascular: Negative for chest pain, palpitations and leg swelling.  Gastrointestinal: Negative for abdominal pain, constipation, diarrhea, heartburn, nausea and vomiting.  Genitourinary: Negative for dysuria, frequency and urgency.  Musculoskeletal: Negative for myalgias.  Skin: Negative for rash.  Neurological: Negative for dizziness, seizures, weakness and headaches.  Psychiatric/Behavioral: Positive for depression. Negative for substance abuse and suicidal ideas. The patient is nervous/anxious.     Current Outpatient Medications:  .  albuterol (PROVENTIL) (2.5 MG/3ML) 0.083% nebulizer solution, Take 3 mLs (2.5 mg total) by nebulization every 4 (four) hours as needed for wheezing or shortness of breath., Disp: 120 mL, Rfl: 6 .  albuterol (VENTOLIN HFA) 108 (90 Base) MCG/ACT inhaler, Inhale into the lungs every 6 (six) hours as needed for wheezing or shortness of breath., Disp: , Rfl:  .  amLODipine (NORVASC) 10 MG tablet, Take 1 tablet (10 mg total) by mouth daily., Disp: 90 tablet, Rfl: 1 .  clonazePAM (KLONOPIN) 0.5 MG tablet, Take 1 tablet (0.5 mg total) by mouth daily., Disp: 30 tablet, Rfl: 1 .  Fluticasone-Umeclidin-Vilant (TRELEGY ELLIPTA) 100-62.5-25 MCG/INH AEPB, Inhale 1 Inhaler into the lungs daily., Disp: 1 each, Rfl: 11 .  losartan (COZAAR) 50 MG  tablet, Take 2 tablets (100 mg total) by mouth daily., Disp: 180 tablet, Rfl: 1 .  metFORMIN (GLUCOPHAGE) 500 MG tablet, Take 250 mg by mouth daily., Disp: , Rfl:  .  rosuvastatin (CRESTOR) 10 MG tablet, Take 1 tablet (10 mg total) by mouth daily., Disp: 90 tablet, Rfl: 1 .  escitalopram (LEXAPRO) 10 MG tablet, Take 1 tablet (10 mg total) by mouth daily., Disp: 30 tablet, Rfl: 2  Allergies  Allergen Reactions  . Penicillins   . Codeine Rash    Past Medical History:  Diagnosis Date  . Allergic rhinitis, cause unspecified   . Anxiety state, unspecified   . Chronic airway obstruction, not elsewhere classified   . Depressive disorder, not elsewhere classified   . Essential hypertension, benign   . Fatigue   . Hyperlipidemia   . Lower back pain   . Menopause   . Panic disorder without agoraphobia   . Seborrheic keratosis   . Type II or unspecified type diabetes mellitus without mention of complication, not stated as uncontrolled   . Unspecified arthropathy, shoulder region     Past Surgical History:  Procedure Laterality Date  .  APPENDECTOMY    . BREAST BIOPSY  12/2012   benign  . CESAREAN SECTION    . TONSILLECTOMY    . TUBAL LIGATION      Family History  Problem Relation Age of Onset  . Cancer Other   . Allergies Other   . Diabetes Other   . Heart disease Other   . Hypertension Other   . Migraines Other   . Osteoporosis Other   . Seizures Other   . COPD Mother   . Heart disease Mother   . Asthma Son   . Stroke Father   . Diabetes Brother   . Heart attack Brother   . Aneurysm Brother   . Throat cancer Maternal Grandfather   . Diabetes Paternal Grandmother   . Heart attack Paternal Grandmother   . Pneumonia Paternal Grandfather     Social History   Socioeconomic History  . Marital status: Single    Spouse name: Not on file  . Number of children: Not on file  . Years of education: Not on file  . Highest education level: Not on file  Occupational History    . Not on file  Tobacco Use  . Smoking status: Current Every Day Smoker    Packs/day: 1.50    Years: 30.00    Pack years: 45.00    Types: Cigarettes  . Smokeless tobacco: Never Used  Substance and Sexual Activity  . Alcohol use: Yes    Comment: 2 drinks/day or fewer  . Drug use: Never  . Sexual activity: Yes    Partners: Male    Birth control/protection: Surgical  Other Topics Concern  . Not on file  Social History Narrative   Originally from [place]. Always lived in [place]. 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 Also worked in [employer] Jeanne Becker has a dog currently. Remote exposure to love birds. No mold exposure.    Social Determinants of Health   Financial Resource Strain:   . Difficulty of Paying Living Expenses:   Food Insecurity:   . Worried About Charity fundraiser in the Last Year:   . Arboriculturist in the Last Year:   Transportation Needs:   . Film/video editor (Medical):   Marland Kitchen Lack of Transportation (Non-Medical):   Physical Activity:   . Days of Exercise per Week:   . Minutes of Exercise per Session:   Stress:   . Feeling of Stress :   Social Connections:   . Frequency of Communication with Friends and Family:   . Frequency of Social Gatherings with Friends and Family:   . Attends Religious Services:   . Active Member of Clubs or Organizations:   . Attends Archivist Meetings:   Marland Kitchen Marital Status:   Intimate Partner Violence:   . Fear of Current or Ex-Partner:   . Emotionally Abused:   Marland Kitchen Physically Abused:   . Sexually Abused:     Objective:   Today's Vitals: BP 97/61   Pulse 100   Temp 98.9 F (37.2 C) (Temporal)   Ht 5' 1.5" (1.562 m)   Wt 92 lb 12.8 oz (42.1 kg)   SpO2 98%   BMI 17.25 kg/m   Physical Exam Vitals reviewed.  Constitutional:      General: Jeanne Becker is not in acute distress.    Appearance: Normal appearance. Jeanne Becker is underweight. Jeanne Becker is not ill-appearing, toxic-appearing or diaphoretic.  HENT:      Head: Normocephalic and atraumatic.  Eyes:  General: No scleral icterus.       Right eye: No discharge.        Left eye: No discharge.     Conjunctiva/sclera: Conjunctivae normal.  Cardiovascular:     Rate and Rhythm: Normal rate and regular rhythm.     Heart sounds: Normal heart sounds. No murmur. No friction rub. No gallop.   Pulmonary:     Effort: Pulmonary effort is normal. No respiratory distress.     Breath sounds: Normal breath sounds. No stridor. No wheezing, rhonchi or rales.  Musculoskeletal:        General: Normal range of motion.     Cervical back: Normal range of motion.  Skin:    General: Skin is warm and dry.     Capillary Refill: Capillary refill takes less than 2 seconds.     Findings: Bruising (both hands) present.  Neurological:     General: No focal deficit present.     Mental Status: Jeanne Becker is alert and oriented to person, place, and time. Mental status is at baseline.  Psychiatric:        Mood and Affect: Mood normal.        Behavior: Behavior normal.        Thought Content: Thought content normal.        Judgment: Judgment normal.

## 2020-03-20 ENCOUNTER — Telehealth: Payer: Self-pay | Admitting: Family Medicine

## 2020-03-20 LAB — CBC WITH DIFFERENTIAL/PLATELET
Basophils Absolute: 0 10*3/uL (ref 0.0–0.2)
Basos: 1 %
EOS (ABSOLUTE): 0.2 10*3/uL (ref 0.0–0.4)
Eos: 3 %
Hematocrit: 44 % (ref 34.0–46.6)
Hemoglobin: 15.2 g/dL (ref 11.1–15.9)
Immature Grans (Abs): 0 10*3/uL (ref 0.0–0.1)
Immature Granulocytes: 0 %
Lymphocytes Absolute: 1.3 10*3/uL (ref 0.7–3.1)
Lymphs: 19 %
MCH: 35.9 pg — ABNORMAL HIGH (ref 26.6–33.0)
MCHC: 34.5 g/dL (ref 31.5–35.7)
MCV: 104 fL — ABNORMAL HIGH (ref 79–97)
Monocytes Absolute: 0.8 10*3/uL (ref 0.1–0.9)
Monocytes: 11 %
Neutrophils Absolute: 4.6 10*3/uL (ref 1.4–7.0)
Neutrophils: 66 %
Platelets: 195 10*3/uL (ref 150–450)
RBC: 4.23 x10E6/uL (ref 3.77–5.28)
RDW: 11.7 % (ref 11.7–15.4)
WBC: 6.9 10*3/uL (ref 3.4–10.8)

## 2020-03-20 LAB — CMP14+EGFR
ALT: 11 IU/L (ref 0–32)
AST: 23 IU/L (ref 0–40)
Albumin/Globulin Ratio: 2.1 (ref 1.2–2.2)
Albumin: 4.6 g/dL (ref 3.8–4.8)
Alkaline Phosphatase: 55 IU/L (ref 39–117)
BUN/Creatinine Ratio: 10 — ABNORMAL LOW (ref 12–28)
BUN: 8 mg/dL (ref 8–27)
Bilirubin Total: 0.4 mg/dL (ref 0.0–1.2)
CO2: 24 mmol/L (ref 20–29)
Calcium: 9.8 mg/dL (ref 8.7–10.3)
Chloride: 102 mmol/L (ref 96–106)
Creatinine, Ser: 0.84 mg/dL (ref 0.57–1.00)
GFR calc Af Amer: 85 mL/min/{1.73_m2} (ref >59)
GFR calc non Af Amer: 74 mL/min/{1.73_m2} (ref >59)
Globulin, Total: 2.2 g/dL (ref 1.5–4.5)
Glucose: 89 mg/dL (ref 65–99)
Potassium: 3.7 mmol/L (ref 3.5–5.2)
Sodium: 141 mmol/L (ref 134–144)
Total Protein: 6.8 g/dL (ref 6.0–8.5)

## 2020-03-20 LAB — LIPID PANEL
Chol/HDL Ratio: 2.4 ratio (ref 0.0–4.4)
Cholesterol, Total: 167 mg/dL (ref 100–199)
HDL: 71 mg/dL (ref >39)
LDL Chol Calc (NIH): 75 mg/dL (ref 0–99)
Triglycerides: 120 mg/dL (ref 0–149)
VLDL Cholesterol Cal: 21 mg/dL (ref 5–40)

## 2020-03-20 NOTE — Telephone Encounter (Signed)
Pt returned missed call from [place] regarding lab work. Made pt aware to stop taking Metformin Rx for now per Britneys notes and also that we would call her with the rest of her results once they come in.

## 2020-03-21 ENCOUNTER — Encounter: Payer: Self-pay | Admitting: Family Medicine

## 2020-03-22 LAB — COMPLIANCE DRUG ANALYSIS, UR

## 2020-03-24 ENCOUNTER — Encounter: Payer: Self-pay | Admitting: *Deleted

## 2020-03-24 NOTE — Progress Notes (Signed)
Patient aware and verbalizes understanding - rx canceled.

## 2020-04-01 ENCOUNTER — Ambulatory Visit: Payer: Medicare Other | Admitting: Physician Assistant

## 2020-04-03 ENCOUNTER — Ambulatory Visit (INDEPENDENT_AMBULATORY_CARE_PROVIDER_SITE_OTHER): Payer: Medicare Other | Admitting: *Deleted

## 2020-04-03 DIAGNOSIS — Z0000 Encounter for general adult medical examination without abnormal findings: Secondary | ICD-10-CM

## 2020-04-03 NOTE — Progress Notes (Signed)
MEDICARE ANNUAL WELLNESS VISIT  04/03/2020  Telephone Visit Disclaimer This Medicare AWV was conducted by telephone due to national recommendations for restrictions regarding the COVID-19 Pandemic (e.g. social distancing).  I verified, using two identifiers, that I am speaking with Jeanne Becker or their authorized healthcare agent. I discussed the limitations, risks, security, and privacy concerns of performing an evaluation and management service by telephone and the potential availability of an in-person appointment in the future. The patient expressed understanding and agreed to proceed.   Subjective:  Jeanne Becker is a 64 y.o. female patient of Gwenlyn Fudge, FNP who had a Medicare Annual Wellness Visit today via telephone. Sujey is Disabled and lives with their spouse. she has 2 children. she reports that she is not socially active and does not interact with friends/family regularly. she is not physically active and enjoys nothing since Covid..  Patient Care Team: Gwenlyn Fudge, FNP as PCP - General (Family Medicine) Leslye Peer, MD as Consulting Physician (Pulmonary Disease)  Advanced Directives 04/03/2020 08/20/2015 01/08/2015  Does Patient Have a Medical Advance Directive? No No No  Would patient like information on creating a medical advance directive? No - Patient declined [facility] Utilization Over the Past 12 Months: # of hospitalizations or ER visits: 0 # of surgeries: 0  Review of Systems    Patient reports that her overall health is worse compared to last year.  History obtained from the patient  Patient Reported Readings (BP, Pulse, CBG, Weight, etc) none  Pain Assessment Pain : No/denies pain     Current Medications & Allergies (verified) Allergies as of 04/03/2020      Reactions   Ivp Dye [iodinated Diagnostic Agents]    Penicillins    Codeine Rash      Medication List       Accurate as of April 03, 2020  2:01 PM. If you have  any questions, ask your nurse or doctor.        STOP taking these medications   metFORMIN 500 MG tablet Commonly known as: GLUCOPHAGE     TAKE these medications   albuterol 108 (90 Base) MCG/ACT inhaler Commonly known as: VENTOLIN HFA Inhale into the lungs every 6 (six) hours as needed for wheezing or shortness of breath.   albuterol (2.5 MG/3ML) 0.083% nebulizer solution Commonly known as: PROVENTIL Take 3 mLs (2.5 mg total) by nebulization every 4 (four) hours as needed for wheezing or shortness of breath.   amLODipine 10 MG tablet Commonly known as: Norvasc Take 1 tablet (10 mg total) by mouth daily.   clonazePAM 0.5 MG tablet Commonly known as: KLONOPIN Take 1 tablet (0.5 mg total) by mouth daily.   escitalopram 10 MG tablet Commonly known as: Lexapro Take 1 tablet (10 mg total) by mouth daily.   losartan 50 MG tablet Commonly known as: COZAAR Take 2 tablets (100 mg total) by mouth daily.   rosuvastatin 10 MG tablet Commonly known as: CRESTOR Take 1 tablet (10 mg total) by mouth daily.   Trelegy Ellipta 100-62.5-25 MCG/INH Aepb Generic drug: Fluticasone-Umeclidin-Vilant Inhale 1 Inhaler into the lungs daily.       History (reviewed): Past Medical History:  Diagnosis Date  . Allergic rhinitis, cause unspecified   . Anxiety state, unspecified   . Chronic airway obstruction, not elsewhere classified   . Depressive disorder, not elsewhere classified   . Essential hypertension, benign   . Fatigue   . Hyperlipidemia   .  Lower back pain   . Menopause   . Panic disorder without agoraphobia   . Seborrheic keratosis   . Type II or unspecified type diabetes mellitus without mention of complication, not stated as uncontrolled   . Unspecified arthropathy, shoulder region    Past Surgical History:  Procedure Laterality Date  . APPENDECTOMY    . BREAST BIOPSY  12/2012   benign  . CESAREAN SECTION    . TONSILLECTOMY    . TUBAL LIGATION     Family History    Problem Relation Age of Onset  . Cancer Other   . Allergies Other   . Diabetes Other   . Heart disease Other   . Hypertension Other   . Migraines Other   . Osteoporosis Other   . Seizures Other   . COPD Mother   . Heart disease Mother   . Asthma Son   . Stroke Father   . Heart attack Brother   . Aneurysm Brother   . Throat cancer Maternal Grandfather   . Diabetes Paternal Grandmother   . Heart attack Paternal Grandmother   . Pneumonia Paternal Grandfather    Social History   Socioeconomic History  . Marital status: Single    Spouse name: Not on file  . Number of children: Not on file  . Years of education: Not on file  . Highest education level: Not on file  Occupational History  . Not on file  Tobacco Use  . Smoking status: Current Every Day Smoker    Packs/day: 1.50    Years: 30.00    Pack years: 45.00    Types: Cigarettes  . Smokeless tobacco: Never Used  Substance and Sexual Activity  . Alcohol use: Yes    Alcohol/week: 12.0 standard drinks    Types: 12 Cans of beer per week    Comment: 2 drinks/day or fewer  . Drug use: Not Currently    Types: Marijuana  . Sexual activity: Yes    Partners: Male    Birth control/protection: Surgical  Other Topics Concern  . Not on file  Social History Narrative   Originally from [place]. Always lived in [place]. 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 She has a dog currently. Remote exposure to love birds. No mold exposure.    Social Determinants of Health   Financial Resource Strain: Low Risk   . Difficulty of Paying Living Expenses: Not very hard  Food Insecurity: No Food Insecurity  . Worried About Programme researcher, broadcasting/film/video in the Last Year: Never true  . Ran Out of Food in the Last Year: Never true  Transportation Needs: No Transportation Needs  . Lack of Transportation (Medical): No  . Lack of Transportation (Non-Medical): No  Physical Activity: Inactive  . Days of  Exercise per Week: 0 days  . Minutes of Exercise per Session: 0 min  Stress: Stress Concern Present  . Feeling of Stress : Rather much  Social Connections: Moderately Isolated  . Frequency of Communication with Friends and Family: Once a week  . Frequency of Social Gatherings with Friends and Family: Never  . Attends Religious Services: Never  . Active Member of Clubs or Organizations: No  . Attends Banker Meetings: Never  . Marital Status: Married    Activities of Daily Living In your present state of health, do you have any difficulty performing the following activities: 04/03/2020  Hearing? N  Vision? N  Difficulty concentrating or  making decisions? N  Walking or climbing stairs? N  Dressing or bathing? N  Doing errands, shopping? N  Preparing Food and eating ? N  Using the Toilet? N  In the past six months, have you accidently leaked urine? N  Do you have problems with loss of bowel control? N  Managing your Medications? N  Managing your Finances? N  Housekeeping or managing your Housekeeping? N  Some recent data might be hidden    Patient Education/ Literacy How often do you need to have someone help you when you read instructions, pamphlets, or other written materials from your doctor or pharmacy?: 1 - Never What is the last grade level you completed in school?: 12  Exercise Current Exercise Habits: The patient does not participate in regular exercise at present, Exercise limited by: None identified  Diet Patient reports consuming 2 meals a day and 2 snack(s) a day Patient reports that her primary diet is: Regular Patient reports that she does have regular access to food.   Depression Screen PHQ 2/9 Scores 04/03/2020 03/19/2020  PHQ - 2 Score 2 2  PHQ- 9 Score 7 6     Fall Risk Fall Risk  04/03/2020 03/19/2020  Falls in the past year? - 1  Number falls in past yr: 0 0  Injury with Fall? 1 0  Risk for fall due to : History of fall(s) -      Objective:  [name] seemed alert and oriented and she participated appropriately during our telephone visit.  Blood Pressure Weight BMI  BP Readings from Last 3 Encounters:  03/19/20 97/61  09/21/19 110/60  03/12/19 108/62   Wt Readings from Last 3 Encounters:  03/19/20 92 lb 12.8 oz (42.1 kg)  09/21/19 93 lb (42.2 kg)  03/12/19 102 lb (46.3 kg)   BMI Readings from Last 1 Encounters:  03/19/20 17.25 kg/m    *Unable to obtain current vital signs, weight, and BMI due to telephone visit type  Hearing/Vision  . [place] did not seem to have difficulty with hearing/understanding during the telephone conversation . Reports that she has had a formal eye exam by an eye care professional within the past year . Reports that she has not had a formal hearing evaluation within the past year *Unable to fully assess hearing and vision during telephone visit type  Cognitive Function: 6CIT Screen 04/03/2020  What Year? 0 points  What month? 0 points  What time? 0 points  Count back from 20 0 points  Months in reverse 2 points  Repeat phrase 2 points  Total Score 4   (Normal:0-7, Significant for Dysfunction: >8)  Normal Cognitive Function Screening: Yes   Immunization & Health Maintenance Record Immunization History  Administered Date(s) Administered  . DTaP 01/28/2014  . Influenza Whole 10/07/2012  . Influenza, High Dose Seasonal PF 11/11/2016, 09/15/2017  . Influenza,inj,Quad PF,6+ Mos 11/06/2012, 08/31/2013, 01/08/2015, 02/19/2016, 09/14/2018, 09/21/2019  . Influenza-Unspecified 11/06/2012, 02/19/2016  . Pneumococcal Conjugate-13 07/17/2014  . Pneumococcal Polysaccharide-23 08/31/2013  . Tdap 01/29/2014    Health Maintenance  Topic Date Due  . FOOT EXAM  Never done  . OPHTHALMOLOGY EXAM  Never done  . PAP SMEAR-Modifier  Never done  . MAMMOGRAM  Never done  . Fecal DNA (Cologuard)  Never done  . Hepatitis C Screening  03/19/2021 (Originally Jan 10, 1956)  . HIV  Screening  03/19/2021 (Originally 01/12/1971)  . INFLUENZA VACCINE  07/27/2020  . HEMOGLOBIN A1C  09/19/2020  . TETANUS/TDAP  01/30/2024  Assessment  This is a routine wellness examination for Jeanne Becker.  Health Maintenance: Due or Overdue Health Maintenance Due  Topic Date Due  . FOOT EXAM  Never done  . OPHTHALMOLOGY EXAM  Never done  . PAP SMEAR-Modifier  Never done  . MAMMOGRAM  Never done  . Fecal DNA (Cologuard)  Never done    Jeanne Becker does not need a referral for Community Assistance: Care Management:   no Social Work:    no Prescription Assistance:  no Nutrition/Diabetes Education:  no   Plan:  Personalized Goals Goals Addressed            This Visit's Progress   . Have 3 meals a day (pt-stated)       Pt does not have much of an appetite, she will try to eat alittle something at breakfast, lunch and supper.      Personalized Health Maintenance & Screening Recommendations  Screening Pap smear and pelvic exam  Colorectal cancer screening Nutrition counseling  Smoking cessation counseling Advanced directives: has NO advanced directive - not interested in additional information  Lung Cancer Screening Recommended: yes (Low Dose CT Chest recommended if Age 93-80 years, 30 pack-year currently smoking OR have quit w/in past 15 years) Hepatitis C Screening recommended: yes HIV Screening recommended: yes  Advanced Directives: Written information was not prepared per patient's request.  Referrals & Orders No orders of the defined types were placed in this encounter.   Follow-up Plan . Follow-up with Gwenlyn Fudge, FNP as planned on 04/30/20. . Pt has a mammogram scheduled in May 2021 . Pt needs cologuard testing and a pap, she will discuss at next visit. Marland Kitchen She will make an eye exam appt in the next few months. . Pt is concerned over her weight loss, she has been drinking boost, and her goal today was to eat 3 meals a day. Pt voiced no  concerns and says she will return in May for appt. AVS printed and mailed to pt    I have personally reviewed and noted the following in the patient's chart:   . Medical and social history . Use of alcohol, tobacco or illicit drugs  . Current medications and supplements . Functional ability and status . Nutritional status . Physical activity . Advanced directives . List of other physicians . Hospitalizations, surgeries, and ER visits in previous 12 months . Vitals . Screenings to include cognitive, depression, and falls . Referrals and appointments  In addition, I have reviewed and discussed with Jeanne Becker certain preventive protocols, quality metrics, and best practice recommendations. A written personalized care plan for preventive services as well as general preventive health recommendations is available and can be mailed to the patient at her request.      Conard Novak, LPN  [id number]

## 2020-04-07 ENCOUNTER — Telehealth: Payer: Self-pay | Admitting: Emergency Medicine

## 2020-04-07 MED ORDER — ALBUTEROL SULFATE (2.5 MG/3ML) 0.083% IN NEBU: 2.5000 mg | INHALATION_SOLUTION | RESPIRATORY_TRACT | 3 refills | Status: DC | PRN

## 2020-04-07 NOTE — Telephone Encounter (Signed)
Pt requesting refill on Albuterol nebulizer medication. I sent a refill to her pharmacy. Pt understood and nothing further is needed.

## 2020-04-09 DIAGNOSIS — R0602 Shortness of breath: Secondary | ICD-10-CM | POA: Diagnosis not present

## 2020-04-09 DIAGNOSIS — R05 Cough: Secondary | ICD-10-CM | POA: Diagnosis not present

## 2020-04-09 DIAGNOSIS — M549 Dorsalgia, unspecified: Secondary | ICD-10-CM | POA: Diagnosis not present

## 2020-04-09 DIAGNOSIS — N3000 Acute cystitis without hematuria: Secondary | ICD-10-CM | POA: Diagnosis not present

## 2020-04-11 ENCOUNTER — Ambulatory Visit: Payer: Medicare Other | Admitting: Physician Assistant

## 2020-04-12 ENCOUNTER — Other Ambulatory Visit: Payer: Self-pay | Admitting: Family Medicine

## 2020-04-12 DIAGNOSIS — F330 Major depressive disorder, recurrent, mild: Secondary | ICD-10-CM

## 2020-04-12 DIAGNOSIS — F411 Generalized anxiety disorder: Secondary | ICD-10-CM

## 2020-04-13 DIAGNOSIS — R110 Nausea: Secondary | ICD-10-CM | POA: Diagnosis not present

## 2020-04-13 DIAGNOSIS — E119 Type 2 diabetes mellitus without complications: Secondary | ICD-10-CM | POA: Diagnosis not present

## 2020-04-13 DIAGNOSIS — R531 Weakness: Secondary | ICD-10-CM | POA: Diagnosis not present

## 2020-04-13 DIAGNOSIS — N3001 Acute cystitis with hematuria: Secondary | ICD-10-CM | POA: Diagnosis not present

## 2020-04-13 DIAGNOSIS — J441 Chronic obstructive pulmonary disease with (acute) exacerbation: Secondary | ICD-10-CM | POA: Diagnosis not present

## 2020-04-18 LAB — COLOGUARD

## 2020-04-20 ENCOUNTER — Other Ambulatory Visit: Payer: Self-pay

## 2020-04-20 ENCOUNTER — Emergency Department (HOSPITAL_COMMUNITY)
Admission: EM | Admit: 2020-04-20 | Discharge: 2020-04-20 | Discharge status: Absent without leave | Payer: BC Managed Care – PPO

## 2020-04-21 ENCOUNTER — Telehealth: Payer: Self-pay | Admitting: *Deleted

## 2020-04-21 ENCOUNTER — Telehealth: Payer: Self-pay | Admitting: Family Medicine

## 2020-04-21 DIAGNOSIS — J449 Chronic obstructive pulmonary disease, unspecified: Secondary | ICD-10-CM

## 2020-04-21 NOTE — Telephone Encounter (Signed)
Fax from [facility] Rx for Omron nebulizer Dx [phone]

## 2020-04-21 NOTE — Telephone Encounter (Signed)
**  [facility] Medicine After Hours/ Emergency Line Call**  Patient: Jeanne Becker.  PCP: Gwenlyn Fudge, FNP  Calling to report that since she discontinued her Metformin she has had a UTI and bronchitis. "they said this could happen if I stopped Metformin".  When asked who "they" was she could not tell me.  She is asking for appt with PCP for recheck of BGs and discussion of above.  Recommended calling in tomorrow for appt.  Talasia Saulter M. Nadine Counts, DO

## 2020-04-22 ENCOUNTER — Telehealth: Payer: Self-pay | Admitting: Emergency Medicine

## 2020-04-22 DIAGNOSIS — J449 Chronic obstructive pulmonary disease, unspecified: Secondary | ICD-10-CM

## 2020-04-22 NOTE — Addendum Note (Signed)
Addended by: Gwenlyn Fudge on: 04/22/2020 01:18 PM   Modules accepted: Orders

## 2020-04-22 NOTE — Telephone Encounter (Signed)
Ordered. To be faxed over.

## 2020-04-22 NOTE — Telephone Encounter (Signed)
Order faxed to [facility] 

## 2020-04-22 NOTE — Telephone Encounter (Signed)
Order has been placed.

## 2020-04-23 DIAGNOSIS — Z681 Body mass index (BMI) 19 or less, adult: Secondary | ICD-10-CM | POA: Diagnosis not present

## 2020-04-23 DIAGNOSIS — R300 Dysuria: Secondary | ICD-10-CM | POA: Diagnosis not present

## 2020-04-25 ENCOUNTER — Telehealth: Payer: Self-pay | Admitting: Family Medicine

## 2020-04-25 NOTE — Telephone Encounter (Signed)
Patient aware and verbalizes understanding. 

## 2020-04-25 NOTE — Telephone Encounter (Signed)
Sure

## 2020-04-28 DIAGNOSIS — Z1211 Encounter for screening for malignant neoplasm of colon: Secondary | ICD-10-CM | POA: Diagnosis not present

## 2020-04-29 DIAGNOSIS — Z1231 Encounter for screening mammogram for malignant neoplasm of breast: Secondary | ICD-10-CM | POA: Diagnosis not present

## 2020-04-30 ENCOUNTER — Ambulatory Visit (INDEPENDENT_AMBULATORY_CARE_PROVIDER_SITE_OTHER): Payer: Medicare Other | Admitting: Family Medicine

## 2020-04-30 ENCOUNTER — Other Ambulatory Visit: Payer: Self-pay

## 2020-04-30 ENCOUNTER — Encounter: Payer: Self-pay | Admitting: Family Medicine

## 2020-04-30 VITALS — BP 103/62 | HR 112 | Temp 98.2°F | Ht 61.5 in | Wt 90.6 lb

## 2020-04-30 DIAGNOSIS — G479 Sleep disorder, unspecified: Secondary | ICD-10-CM

## 2020-04-30 DIAGNOSIS — F411 Generalized anxiety disorder: Secondary | ICD-10-CM

## 2020-04-30 MED ORDER — MIRTAZAPINE 15 MG PO TABS: 15.0000 mg | ORAL_TABLET | Freq: Every day | ORAL | 2 refills | Status: DC

## 2020-04-30 NOTE — Progress Notes (Signed)
Assessment & Plan:  1. Generalized anxiety disorder - Uncontrolled. Patient has failed therapy with Valium, Paxil, and Lexapro. Changed patient to Remeron today to also help with sleep.  - mirtazapine (REMERON) 15 MG tablet; Take 1 tablet (15 mg total) by mouth at bedtime.  Dispense: 30 tablet; Refill: 2  2. Difficulty sleeping - Uncontrolled. New Rx.  - mirtazapine (REMERON) 15 MG tablet; Take 1 tablet (15 mg total) by mouth at bedtime.  Dispense: 30 tablet; Refill: 2   Return in about 6 weeks (around 06/11/2020) for f/u anxiety, sleep + pap smear.  Hendricks Limes, MSN, APRN, FNP-C [facility]  Subjective:    Patient ID: Jeanne Becker, female    DOB: 16-Mar-1956, 64 y.o.   MRN: [id number]  Patient Care Team: Loman Brooklyn, FNP as PCP - General (Family Medicine) Collene Gobble, MD as Consulting Physician (Pulmonary Disease)   Chief Complaint:  Chief Complaint  Patient presents with  . Anxiety    6 week follow up. Patient states that she was taking lexapro but it was making her have n/v so she d/c'd it.  Patient would like to go back on her cymbalta.  . Referral    Urologist- frequent uti's    HPI: Jeanne Becker is a 64 y.o. female presenting on 04/30/2020 for Anxiety (6 week follow up. Patient states that she was taking lexapro but it was making her have n/v so she d/c'd it.  Patient would like to go back on her cymbalta.) and Referral (Urologist- frequent uti's)  Patient reports she only took the Lexapro for 3 days due to nausea and vomiting. She has previously failed therapy with Paxil and Valium. Also reports trouble falling asleep.   Depression screen [facility] 2/9 04/30/2020 04/03/2020 03/19/2020  Decreased Interest 0 1 1  Down, Depressed, Hopeless 1 1 1   PHQ - 2 Score 1 2 2   Altered sleeping 0 1 1  Tired, decreased energy 2 2 1   Change in appetite 0 2 1  Feeling bad or failure about yourself  0 0 0  Trouble concentrating 0 0 0  Moving slowly or  fidgety/restless 0 0 1  Suicidal thoughts 0 0 0  PHQ-9 Score 3 7 6   Difficult doing work/chores Not difficult at all Somewhat difficult Somewhat difficult   GAD 7 : Generalized Anxiety Score 04/30/2020 03/19/2020  Nervous, Anxious, on Edge 3 2  Control/stop worrying 3 1  Worry too much - different things 3 1  Trouble relaxing 3 1  Restless 3 1  Easily annoyed or irritable 3 1  Afraid - awful might happen 1 0  Total GAD 7 Score 19 7  Anxiety Difficulty Very difficult Somewhat difficult    New complaints: None. When I asked about a referral that she mentioned to the nurse she stated she didn't want to do this right now.  Social history:  Relevant past medical, surgical, family and social history reviewed and updated as indicated. Interim medical history since our last visit reviewed.  Allergies and medications reviewed and updated.  DATA REVIEWED: CHART IN EPIC  ROS: Negative unless specifically indicated above in HPI.    Current Outpatient Medications:  .  albuterol (PROVENTIL) (2.5 MG/3ML) 0.083% nebulizer solution, Take 3 mLs (2.5 mg total) by nebulization every 4 (four) hours as needed for wheezing or shortness of breath., Disp: 75 mL, Rfl: 3 .  albuterol (VENTOLIN HFA) 108 (90 Base) MCG/ACT inhaler, Inhale into the lungs every 6 (six) hours  as needed for wheezing or shortness of breath., Disp: , Rfl:  .  amLODipine (NORVASC) 10 MG tablet, Take 1 tablet (10 mg total) by mouth daily., Disp: 90 tablet, Rfl: 1 .  Fluticasone-Umeclidin-Vilant (TRELEGY ELLIPTA) 100-62.5-25 MCG/INH AEPB, Inhale 1 Inhaler into the lungs daily., Disp: 1 each, Rfl: 11 .  losartan (COZAAR) 50 MG tablet, Take 2 tablets (100 mg total) by mouth daily., Disp: 180 tablet, Rfl: 1 .  rosuvastatin (CRESTOR) 10 MG tablet, Take 1 tablet (10 mg total) by mouth daily., Disp: 90 tablet, Rfl: 1 .  mirtazapine (REMERON) 15 MG tablet, Take 1 tablet (15 mg total) by mouth at bedtime., Disp: 30 tablet, Rfl: 2   Allergies    Allergen Reactions  . Ivp Dye [Iodinated Diagnostic Agents]   . Lexapro [Escitalopram] Nausea And Vomiting  . Other   . Penicillins   . Codeine Rash   Past Medical History:  Diagnosis Date  . Allergic rhinitis, cause unspecified   . Anxiety state, unspecified   . Chronic airway obstruction, not elsewhere classified   . Depressive disorder, not elsewhere classified   . Essential hypertension, benign   . Fatigue   . Hyperlipidemia   . Lower back pain   . Menopause   . Panic disorder without agoraphobia   . Seborrheic keratosis   . Type II or unspecified type diabetes mellitus without mention of complication, not stated as uncontrolled   . Unspecified arthropathy, shoulder region     Past Surgical History:  Procedure Laterality Date  . APPENDECTOMY    . BREAST BIOPSY  12/2012   benign  . CESAREAN SECTION    . TONSILLECTOMY    . TUBAL LIGATION      Social History   Socioeconomic History  . Marital status: Single    Spouse name: Not on file  . Number of children: Not on file  . Years of education: Not on file  . Highest education level: Not on file  Occupational History  . Not on file  Tobacco Use  . Smoking status: Current Every Day Smoker    Packs/day: 1.50    Years: 30.00    Pack years: 45.00    Types: Cigarettes  . Smokeless tobacco: Never Used  Substance and Sexual Activity  . Alcohol use: Yes    Alcohol/week: 12.0 standard drinks    Types: 12 Cans of beer per week    Comment: 2 drinks/day or fewer  . Drug use: Not Currently    Types: Marijuana  . Sexual activity: Yes    Partners: Male    Birth control/protection: Surgical  Other Topics Concern  . Not on file  Social History Narrative   Originally from [place]. Always lived in [place]. 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 She has a dog currently. Remote exposure to love birds. No mold exposure.    Social Determinants of Health   Financial Resource  Strain: Low Risk   . Difficulty of Paying Living Expenses: Not very hard  Food Insecurity: No Food Insecurity  . Worried About Programme researcher, broadcasting/film/video in the Last Year: Never true  . Ran Out of Food in the Last Year: Never true  Transportation Needs: No Transportation Needs  . Lack of Transportation (Medical): No  . Lack of Transportation (Non-Medical): No  Physical Activity: Inactive  . Days of Exercise per Week: 0 days  . Minutes of Exercise per Session: 0 min  Stress: Stress Concern Present  .  Feeling of Stress : Rather much  Social Connections: Moderately Isolated  . Frequency of Communication with Friends and Family: Once a week  . Frequency of Social Gatherings with Friends and Family: Never  . Attends Religious Services: Never  . Active Member of Clubs or Organizations: No  . Attends Banker Meetings: Never  . Marital Status: Married  Catering manager Violence: Not At Risk  . Fear of Current or Ex-Partner: No  . Emotionally Abused: No  . Physically Abused: No  . Sexually Abused: No        Objective:    BP 103/62   Pulse (!) 112   Temp 98.2 F (36.8 C) (Temporal)   Ht 5' 1.5" (1.562 m)   Wt 90 lb 9.6 oz (41.1 kg)   SpO2 98%   BMI 16.84 kg/m   Wt Readings from Last 3 Encounters:  04/30/20 90 lb 9.6 oz (41.1 kg)  03/19/20 92 lb 12.8 oz (42.1 kg)  09/21/19 93 lb (42.2 kg)    Physical Exam Vitals reviewed.  Constitutional:      General: She is not in acute distress.    Appearance: Normal appearance. She is not ill-appearing, toxic-appearing or diaphoretic.  HENT:     Head: Normocephalic and atraumatic.  Eyes:     General: No scleral icterus.       Right eye: No discharge.        Left eye: No discharge.     Conjunctiva/sclera: Conjunctivae normal.  Cardiovascular:     Rate and Rhythm: Normal rate.  Pulmonary:     Effort: Pulmonary effort is normal. No respiratory distress.  Musculoskeletal:        General: Normal range of motion.     Cervical  back: Normal range of motion.  Skin:    General: Skin is warm and dry.     Capillary Refill: Capillary refill takes less than 2 seconds.  Neurological:     General: No focal deficit present.     Mental Status: She is alert and oriented to person, place, and time. Mental status is at baseline.  Psychiatric:        Mood and Affect: Mood is anxious. Affect is tearful.        Behavior: Behavior normal.        Thought Content: Thought content normal.        Judgment: Judgment normal.     No results found for: TSH Lab Results  Component Value Date   WBC 6.9 03/19/2020   HGB 15.2 03/19/2020   HCT 44.0 03/19/2020   MCV 104 (H) 03/19/2020   PLT 195 03/19/2020   Lab Results  Component Value Date   NA 141 03/19/2020   K 3.7 03/19/2020   CO2 24 03/19/2020   GLUCOSE 89 03/19/2020   BUN 8 03/19/2020   CREATININE 0.84 03/19/2020   BILITOT 0.4 03/19/2020   ALKPHOS 55 03/19/2020   AST 23 03/19/2020   ALT 11 03/19/2020   PROT 6.8 03/19/2020   ALBUMIN 4.6 03/19/2020   CALCIUM 9.8 03/19/2020   Lab Results  Component Value Date   CHOL 167 03/19/2020   Lab Results  Component Value Date   HDL 71 03/19/2020   Lab Results  Component Value Date   LDLCALC 75 03/19/2020   Lab Results  Component Value Date   TRIG 120 03/19/2020   Lab Results  Component Value Date   CHOLHDL 2.4 03/19/2020   Lab Results  Component Value Date  HGBA1C 5.3 03/19/2020

## 2020-05-02 LAB — COLOGUARD: COLOGUARD: NEGATIVE

## 2020-05-05 ENCOUNTER — Telehealth: Payer: Self-pay | Admitting: Family Medicine

## 2020-05-05 MED ORDER — NYSTATIN 100000 UNIT/ML MT SUSP: 5.0000 mL | Freq: Four times a day (QID) | OROMUCOSAL | 2 refills | Status: DC

## 2020-05-05 NOTE — Telephone Encounter (Signed)
Patient aware and verbalizes understanding. 

## 2020-05-05 NOTE — Telephone Encounter (Signed)
Pt called stating that she has thrush in her mouth from using her inhaler and is requesting that Britney send her something to pharmacy for it or give her advice on what she can get OTC.

## 2020-05-05 NOTE — Telephone Encounter (Signed)
Nystatin sent to pharmacy. Make sure after you use your inhalers you rinse with water.

## 2020-05-06 LAB — COLOGUARD: Cologuard: NEGATIVE

## 2020-05-16 ENCOUNTER — Ambulatory Visit: Payer: Medicare Other | Admitting: Family Medicine

## 2020-05-23 ENCOUNTER — Other Ambulatory Visit: Payer: Self-pay | Admitting: Family Medicine

## 2020-05-23 DIAGNOSIS — G479 Sleep disorder, unspecified: Secondary | ICD-10-CM

## 2020-05-23 DIAGNOSIS — F411 Generalized anxiety disorder: Secondary | ICD-10-CM

## 2020-06-02 ENCOUNTER — Telehealth: Payer: Self-pay | Admitting: Family Medicine

## 2020-06-02 MED ORDER — MAGIC MOUTHWASH: 5.0000 mL | Freq: Four times a day (QID) | ORAL | 0 refills | Status: DC

## 2020-06-02 NOTE — Telephone Encounter (Signed)
  Incoming Patient Call  06/02/2020  What symptoms do you have? thrush  How long have you been sick? Since last apt   Have you been seen for this problem? Yes nystatin (MYCOSTATIN) 100000 UNIT/ML suspension is not working --pt want magic mouthwash  If your provider decides to give you a prescription, which pharmacy would you like for it to be sent to? [facility]   Patient informed that this information will be sent to the clinical staff for review and that they should receive a follow up call.

## 2020-06-02 NOTE — Telephone Encounter (Signed)
Patient aware.

## 2020-06-02 NOTE — Telephone Encounter (Signed)
Patient will have to pick up prescription , because magic mouth wash will not go electronically to the pharmacy

## 2020-06-17 ENCOUNTER — Other Ambulatory Visit: Payer: Self-pay | Admitting: Family Medicine

## 2020-06-17 ENCOUNTER — Other Ambulatory Visit: Payer: Self-pay

## 2020-06-17 ENCOUNTER — Encounter: Payer: Self-pay | Admitting: Family Medicine

## 2020-06-17 ENCOUNTER — Ambulatory Visit (INDEPENDENT_AMBULATORY_CARE_PROVIDER_SITE_OTHER): Payer: Medicare Other | Admitting: Family Medicine

## 2020-06-17 VITALS — BP 104/65 | HR 102 | Temp 98.1°F | Ht 61.5 in | Wt 93.4 lb

## 2020-06-17 DIAGNOSIS — N898 Other specified noninflammatory disorders of vagina: Secondary | ICD-10-CM

## 2020-06-17 DIAGNOSIS — N760 Acute vaginitis: Secondary | ICD-10-CM

## 2020-06-17 DIAGNOSIS — F411 Generalized anxiety disorder: Secondary | ICD-10-CM | POA: Diagnosis not present

## 2020-06-17 DIAGNOSIS — G479 Sleep disorder, unspecified: Secondary | ICD-10-CM

## 2020-06-17 LAB — WET PREP FOR TRICH, YEAST, CLUE
Clue Cell Exam: POSITIVE — AB
Trichomonas Exam: NEGATIVE
Yeast Exam: NEGATIVE

## 2020-06-17 MED ORDER — METRONIDAZOLE 500 MG PO TABS: 500.0000 mg | ORAL_TABLET | Freq: Two times a day (BID) | ORAL | 0 refills | Status: DC

## 2020-06-17 NOTE — Progress Notes (Signed)
Assessment & Plan:  1. Generalized anxiety disorder - Patient to start back on Remeron AT NIGHT.   2. Difficulty sleeping - Patient to start back on Remeron AT NIGHT.   3. Vagina itching - WET PREP FOR TRICH, YEAST, CLUE   Return in about 6 weeks (around 07/29/2020) for anxiety, sleep, & pap.  [name], MSN, APRN, FNP-C [facility]  Subjective:    Patient ID: Jeanne Becker, female    DOB: 1956-05-10, 64 y.o.   MRN: [id number]  Patient Care Team: Gwenlyn Fudge, FNP as PCP - General (Family Medicine) Leslye Peer, MD as Consulting Physician (Pulmonary Disease)   Chief Complaint:  Chief Complaint  Patient presents with  . Anxiety    6 week follow up.  Patient states she took two doses of remeron and she stopped bc it made her " feel loopy'.  . Sleeping Problem    HPI: Jeanne Becker is a 64 y.o. female presenting on 06/17/2020 for Anxiety (6 week follow up.  Patient states she took two doses of remeron and she stopped bc it made her " feel loopy'.) and Sleeping Problem  Patient is here for a follow-up of anxiety and difficulty sleeping. She was last prescribed Remeron ~6 weeks ago when she only took for two days due to it making her feel loopy. She has previously failed therapy with Valium, Paxil, and Lexapro (only took x3 days). She use to take Clonazepam which she was weaned off of.   After speaking with patient I learned she was taking the Remeron in the morning, not at bedtime as prescribed.   Depression screen [facility] 2/9 06/17/2020 04/30/2020 04/03/2020  Decreased Interest 0 0 1  Down, Depressed, Hopeless 0 1 1  PHQ - 2 Score 0 1 2  Altered sleeping 0 0 1  Tired, decreased energy 1 2 2   Change in appetite 0 0 2  Feeling bad or failure about yourself  0 0 0  Trouble concentrating 0 0 0  Moving slowly or fidgety/restless 2 0 0  Suicidal thoughts 0 0 0  PHQ-9 Score 3 3 7   Difficult doing work/chores - Not difficult at all Somewhat  difficult   GAD 7 : Generalized Anxiety Score 06/17/2020 04/30/2020 03/19/2020  Nervous, Anxious, on Edge 2 3 2   Control/stop worrying 1 3 1   Worry too much - different things 1 3 1   Trouble relaxing 1 3 1   Restless 1 3 1   Easily annoyed or irritable 1 3 1   Afraid - awful might happen 0 1 0  Total GAD 7 Score 7 19 7   Anxiety Difficulty - Very difficult Somewhat difficult    New complaints: Patient is having vaginal pain, itching, and a change in discharge. She reports it is white and thicker. She has been on multiple antibiotics and steroids recently due to UTI and a COPD exacerbation. She was supposed to have a pap smear today but is declining at this time stating she will do it next time.    Social history:  Relevant past medical, surgical, family and social history reviewed and updated as indicated. Interim medical history since our last visit reviewed.  Allergies and medications reviewed and updated.  DATA REVIEWED: CHART IN EPIC  ROS: Negative unless specifically indicated above in HPI.    Current Outpatient Medications:  .  albuterol (PROVENTIL) (2.5 MG/3ML) 0.083% nebulizer solution, Take 3 mLs (2.5 mg total) by nebulization every 4 (four) hours as needed for wheezing  or shortness of breath., Disp: 75 mL, Rfl: 3 .  albuterol (VENTOLIN HFA) 108 (90 Base) MCG/ACT inhaler, Inhale into the lungs every 6 (six) hours as needed for wheezing or shortness of breath., Disp: , Rfl:  .  amLODipine (NORVASC) 10 MG tablet, Take 1 tablet (10 mg total) by mouth daily., Disp: 90 tablet, Rfl: 1 .  Fluticasone-Umeclidin-Vilant (TRELEGY ELLIPTA) 100-62.5-25 MCG/INH AEPB, Inhale 1 Inhaler into the lungs daily., Disp: 1 each, Rfl: 11 .  losartan (COZAAR) 50 MG tablet, Take 2 tablets (100 mg total) by mouth daily., Disp: 180 tablet, Rfl: 1 .  nystatin (MYCOSTATIN) 100000 UNIT/ML suspension, Take 5 mLs (500,000 Units total) by mouth 4 (four) times daily., Disp: 473 mL, Rfl: 2 .  rosuvastatin (CRESTOR)  10 MG tablet, Take 1 tablet (10 mg total) by mouth daily., Disp: 90 tablet, Rfl: 1   Allergies  Allergen Reactions  . Ivp Dye [Iodinated Diagnostic Agents]   . Lexapro [Escitalopram] Nausea And Vomiting  . Other   . Penicillins   . Codeine Rash   Past Medical History:  Diagnosis Date  . Allergic rhinitis, cause unspecified   . Anxiety state, unspecified   . Chronic airway obstruction, not elsewhere classified   . Depressive disorder, not elsewhere classified   . Essential hypertension, benign   . Fatigue   . Hyperlipidemia   . Lower back pain   . Menopause   . Panic disorder without agoraphobia   . Seborrheic keratosis   . Type II or unspecified type diabetes mellitus without mention of complication, not stated as uncontrolled   . Unspecified arthropathy, shoulder region     Past Surgical History:  Procedure Laterality Date  . APPENDECTOMY    . BREAST BIOPSY  12/2012   benign  . CESAREAN SECTION    . TONSILLECTOMY    . TUBAL LIGATION      Social History   Socioeconomic History  . Marital status: Single    Spouse name: Not on file  . Number of children: Not on file  . Years of education: Not on file  . Highest education level: Not on file  Occupational History  . Not on file  Tobacco Use  . Smoking status: Current Every Day Smoker    Packs/day: 1.50    Years: 30.00    Pack years: 45.00    Types: Cigarettes  . Smokeless tobacco: Never Used  Vaping Use  . Vaping Use: Never used  Substance and Sexual Activity  . Alcohol use: Yes    Alcohol/week: 12.0 standard drinks    Types: 12 Cans of beer per week    Comment: 2 drinks/day or fewer  . Drug use: Not Currently    Types: Marijuana  . Sexual activity: Yes    Partners: Male    Birth control/protection: Surgical  Other Topics Concern  . Not on file  Social History Narrative   Originally from [place]. Always lived in [place]. 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  She has a dog currently. Remote exposure to love birds. No mold exposure.    Social Determinants of Health   Financial Resource Strain: Low Risk   . Difficulty of Paying Living Expenses: Not very hard  Food Insecurity: No Food Insecurity  . Worried About Programme researcher, broadcasting/film/video in the Last Year: Never true  . Ran Out of Food in the Last Year: Never true  Transportation Needs: No Transportation Needs  . Lack of  Transportation (Medical): No  . Lack of Transportation (Non-Medical): No  Physical Activity: Inactive  . Days of Exercise per Week: 0 days  . Minutes of Exercise per Session: 0 min  Stress: Stress Concern Present  . Feeling of Stress : Rather much  Social Connections: Socially Isolated  . Frequency of Communication with Friends and Family: Once a week  . Frequency of Social Gatherings with Friends and Family: Never  . Attends Religious Services: Never  . Active Member of Clubs or Organizations: No  . Attends Banker Meetings: Never  . Marital Status: Married  Catering manager Violence: Not At Risk  . Fear of Current or Ex-Partner: No  . Emotionally Abused: No  . Physically Abused: No  . Sexually Abused: No        Objective:    BP 104/65   Pulse (!) 102   Temp 98.1 F (36.7 C) (Temporal)   Ht 5' 1.5" (1.562 m)   Wt 93 lb 6.4 oz (42.4 kg)   SpO2 99%   BMI 17.36 kg/m   Wt Readings from Last 3 Encounters:  06/17/20 93 lb 6.4 oz (42.4 kg)  04/30/20 90 lb 9.6 oz (41.1 kg)  03/19/20 92 lb 12.8 oz (42.1 kg)    Physical Exam Vitals reviewed.  Constitutional:      General: She is not in acute distress.    Appearance: Normal appearance. She is underweight. She is not ill-appearing, toxic-appearing or diaphoretic.  HENT:     Head: Normocephalic and atraumatic.  Eyes:     General: No scleral icterus.       Right eye: No discharge.        Left eye: No discharge.     Conjunctiva/sclera: Conjunctivae normal.  Cardiovascular:     Rate and Rhythm: Normal  rate.  Pulmonary:     Effort: Pulmonary effort is normal. No respiratory distress.  Musculoskeletal:        General: Normal range of motion.     Cervical back: Normal range of motion.  Skin:    General: Skin is warm and dry.     Capillary Refill: Capillary refill takes less than 2 seconds.  Neurological:     General: No focal deficit present.     Mental Status: She is alert and oriented to person, place, and time. Mental status is at baseline.  Psychiatric:        Mood and Affect: Mood normal.        Behavior: Behavior normal.        Thought Content: Thought content normal.        Judgment: Judgment normal.     No results found for: TSH Lab Results  Component Value Date   WBC 6.9 03/19/2020   HGB 15.2 03/19/2020   HCT 44.0 03/19/2020   MCV 104 (H) 03/19/2020   PLT 195 03/19/2020   Lab Results  Component Value Date   NA 141 03/19/2020   K 3.7 03/19/2020   CO2 24 03/19/2020   GLUCOSE 89 03/19/2020   BUN 8 03/19/2020   CREATININE 0.84 03/19/2020   BILITOT 0.4 03/19/2020   ALKPHOS 55 03/19/2020   AST 23 03/19/2020   ALT 11 03/19/2020   PROT 6.8 03/19/2020   ALBUMIN 4.6 03/19/2020   CALCIUM 9.8 03/19/2020   Lab Results  Component Value Date   CHOL 167 03/19/2020   Lab Results  Component Value Date   HDL 71 03/19/2020   Lab Results  Component Value Date  [place] 75 03/19/2020   Lab Results  Component Value Date   TRIG 120 03/19/2020   Lab Results  Component Value Date   CHOLHDL 2.4 03/19/2020   Lab Results  Component Value Date   HGBA1C 5.3 03/19/2020

## 2020-06-22 ENCOUNTER — Other Ambulatory Visit: Payer: Self-pay | Admitting: Emergency Medicine

## 2020-07-02 ENCOUNTER — Other Ambulatory Visit: Payer: Self-pay | Admitting: Family Medicine

## 2020-07-02 ENCOUNTER — Telehealth: Payer: Self-pay | Admitting: Family Medicine

## 2020-07-02 DIAGNOSIS — B9689 Other specified bacterial agents as the cause of diseases classified elsewhere: Secondary | ICD-10-CM

## 2020-07-02 NOTE — Telephone Encounter (Signed)
  Incoming Patient Call  07/02/2020  What symptoms do you have? Vaginal discharge  How long have you been sick? For about two weeks  Have you been seen for this problem? yes  If your provider decides to give you a prescription, which pharmacy would you like for it to be sent to? [facility], pt would like a refill on her antibiotic she was taking since her infection has not completely cleared up, she is not hurting just discharge   Patient informed that this information will be sent to the clinical staff for review and that they should receive a follow up call.

## 2020-07-03 NOTE — Telephone Encounter (Signed)
She can come by for a wet prep to see if treatment worked if she would like.

## 2020-07-03 NOTE — Telephone Encounter (Signed)
Requesting more medicine?

## 2020-07-03 NOTE — Telephone Encounter (Signed)
She may come to office later to do wet prep.  Ms Alona Bene nurse will assist patient if she comes in.

## 2020-07-04 NOTE — Telephone Encounter (Signed)
Patient aware and verbalizes understanding. 

## 2020-07-10 ENCOUNTER — Ambulatory Visit (INDEPENDENT_AMBULATORY_CARE_PROVIDER_SITE_OTHER): Payer: Medicare Other | Admitting: Family Medicine

## 2020-07-10 DIAGNOSIS — J0100 Acute maxillary sinusitis, unspecified: Secondary | ICD-10-CM

## 2020-07-10 MED ORDER — SULFAMETHOXAZOLE-TRIMETHOPRIM 800-160 MG PO TABS: 1.0000 | ORAL_TABLET | Freq: Two times a day (BID) | ORAL | 0 refills | Status: DC

## 2020-07-10 NOTE — Progress Notes (Signed)
Subjective:    Patient ID: Jeanne Becker, female    DOB: 10-16-1956, 64 y.o.   MRN: [id number]   HPI: [name] is a 64 y.o. female presenting for Symptoms include congestion, dry in the nose, nasal congestion, non productive cough, post nasal drip. There is no fever, chills, or sweats. Onset of symptoms was a few days ago, gradually worsening since that time.     Depression screen [facility] 2/9 06/17/2020 04/30/2020 04/03/2020 03/19/2020  Decreased Interest 0 0 1 1  Down, Depressed, Hopeless 0 1 1 1   PHQ - 2 Score 0 1 2 2   Altered sleeping 0 0 1 1  Tired, decreased energy 1 2 2 1   Change in appetite 0 0 2 1  Feeling bad or failure about yourself  0 0 0 0  Trouble concentrating 0 0 0 0  Moving slowly or fidgety/restless 2 0 0 1  Suicidal thoughts 0 0 0 0  PHQ-9 Score 3 3 7 6   Difficult doing work/chores - Not difficult at all Somewhat difficult Somewhat difficult     Relevant past medical, surgical, family and social history reviewed and updated as indicated.  Interim medical history since our last visit reviewed. Allergies and medications reviewed and updated.  ROS:  Review of Systems  Constitutional: Negative for activity change, appetite change, chills and fever.  HENT: Positive for congestion, ear discharge, postnasal drip, sinus pressure and sore throat (feels funny). Negative for ear pain, hearing loss, nosebleeds, rhinorrhea, sneezing and trouble swallowing.   Respiratory: Positive for cough. Negative for chest tightness and shortness of breath.   Cardiovascular: Negative for chest pain and palpitations.  Skin: Negative for rash.     Social History   Tobacco Use  Smoking Status Current Every Day Smoker  . Packs/day: 1.50  . Years: 30.00  . Pack years: 45.00  . Types: Cigarettes  Smokeless Tobacco Never Used       Objective:     Wt Readings from Last 3 Encounters:  06/17/20 93 lb 6.4 oz (42.4 kg)  04/30/20 90 lb 9.6 oz (41.1 kg)  03/19/20 92 lb 12.8 oz  (42.1 kg)     Exam deferred. Pt. Harboring due to COVID 19. Phone visit performed.   Assessment & Plan:   1. Acute maxillary sinusitis, recurrence not specified     Meds ordered this encounter  Medications  . sulfamethoxazole-trimethoprim (BACTRIM DS) 800-160 MG tablet    Sig: Take 1 tablet by mouth 2 (two) times daily. Until gone, for infection    Dispense:  20 tablet    Refill:  0    No orders of the defined types were placed in this encounter.     Diagnoses and all orders for this visit:  Acute maxillary sinusitis, recurrence not specified  Other orders -     sulfamethoxazole-trimethoprim (BACTRIM DS) 800-160 MG tablet; Take 1 tablet by mouth 2 (two) times daily. Until gone, for infection    Virtual Visit via telephone Note  I discussed the limitations, risks, security and privacy concerns of performing an evaluation and management service by telephone and the availability of in person appointments. The patient was identified with two identifiers. Pt.expressed understanding and agreed to proceed. Pt. Is at home. Dr. is in his office.  Follow Up Instructions:   I discussed the assessment and treatment plan with the patient. The patient was provided an opportunity to ask questions and all were answered. The patient agreed with the plan and  demonstrated an understanding of the instructions.   The patient was advised to call back or seek an in-person evaluation if the symptoms worsen or if the condition fails to improve as anticipated.   Total minutes including chart review and phone contact time: 9   Follow up plan: No follow-ups on file.  Mechele Claude, MD [facility]

## 2020-07-14 ENCOUNTER — Encounter: Payer: Self-pay | Admitting: Family Medicine

## 2020-07-30 ENCOUNTER — Ambulatory Visit (INDEPENDENT_AMBULATORY_CARE_PROVIDER_SITE_OTHER): Payer: Medicare Other | Admitting: Family Medicine

## 2020-07-30 ENCOUNTER — Other Ambulatory Visit (HOSPITAL_COMMUNITY)
Admission: RE | Admit: 2020-07-30 | Discharge: 2020-07-30 | Disposition: A | Discharge status: Home / Self Care | Payer: Medicare Other | Source: Ambulatory Visit | Attending: Family Medicine | Admitting: Family Medicine

## 2020-07-30 ENCOUNTER — Encounter: Payer: Self-pay | Admitting: Family Medicine

## 2020-07-30 ENCOUNTER — Other Ambulatory Visit: Payer: Self-pay

## 2020-07-30 VITALS — BP 96/59 | HR 91 | Temp 97.9°F | Ht 61.5 in | Wt 90.6 lb

## 2020-07-30 DIAGNOSIS — Z124 Encounter for screening for malignant neoplasm of cervix: Secondary | ICD-10-CM

## 2020-07-30 DIAGNOSIS — F411 Generalized anxiety disorder: Secondary | ICD-10-CM

## 2020-07-30 DIAGNOSIS — Z113 Encounter for screening for infections with a predominantly sexual mode of transmission: Secondary | ICD-10-CM

## 2020-07-30 DIAGNOSIS — B3731 Acute candidiasis of vulva and vagina: Secondary | ICD-10-CM

## 2020-07-30 DIAGNOSIS — B373 Candidiasis of vulva and vagina: Secondary | ICD-10-CM

## 2020-07-30 DIAGNOSIS — I10 Essential (primary) hypertension: Secondary | ICD-10-CM

## 2020-07-30 DIAGNOSIS — Z1151 Encounter for screening for human papillomavirus (HPV): Secondary | ICD-10-CM | POA: Diagnosis present

## 2020-07-30 DIAGNOSIS — N898 Other specified noninflammatory disorders of vagina: Secondary | ICD-10-CM

## 2020-07-30 LAB — WET PREP FOR TRICH, YEAST, CLUE
Clue Cell Exam: NEGATIVE
Trichomonas Exam: NEGATIVE
Yeast Exam: POSITIVE — AB

## 2020-07-30 MED ORDER — BUSPIRONE HCL 7.5 MG PO TABS: 7.5000 mg | ORAL_TABLET | Freq: Two times a day (BID) | ORAL | 2 refills | Status: DC

## 2020-07-30 MED ORDER — FLUCONAZOLE 150 MG PO TABS: 150.0000 mg | ORAL_TABLET | Freq: Once | ORAL | 0 refills | Status: AC

## 2020-07-30 NOTE — Patient Instructions (Addendum)
Stop taking amlodipine (Norvasc) 10 mg.

## 2020-07-30 NOTE — Progress Notes (Signed)
Assessment & Plan:  1. Generalized anxiety disorder - Uncontrolled. Patient has failed therapy with Remeron, Valium, Paxil, and Lexapro.  Patient was started on buspirone today. - busPIRone (BUSPAR) 7.5 MG tablet; Take 1 tablet (7.5 mg total) by mouth 2 (two) times daily.  Dispense: 60 tablet; Refill: 2  2. Essential hypertension - Patient advised to stop amlodipine 10 mg once daily due to soft blood pressures.  3. Vaginal yeast infection - fluconazole (DIFLUCAN) 150 MG tablet; Take 1 tablet (150 mg total) by mouth once for 1 dose. May repeat after 3 days if needed.  Dispense: 2 tablet; Refill: 0  4. Vaginal discharge - WET PREP FOR TRICH, YEAST, CLUE -positive for yeast.  5. Screening for cervical cancer - Cytology - PAP  6. Routine screening for STI (sexually transmitted infection) - Cytology - PAP  7. Screening for human papillomavirus (HPV) - Cytology - PAP   Return in about 6 weeks (around 09/10/2020) for anxiety & BP.  [name], MSN, APRN, FNP-C [facility]  Subjective:    Patient ID: Jeanne Becker, female    DOB: 08-07-1956, 64 y.o.   MRN: [id number]  Patient Care Team: Gwenlyn Fudge, FNP as PCP - General (Family Medicine) Leslye Peer, MD as Consulting Physician (Pulmonary Disease)   Chief Complaint:  Chief Complaint  Patient presents with  . Anxiety    6 week check up.  Patient states it is the same  . Gynecologic Exam  . BV    Patient was seen 7/22 and states it is no better.    HPI: Jeanne Becker is a 64 y.o. female presenting on 07/30/2020 for Anxiety (6 week check up.  Patient states it is the same), Gynecologic Exam, and BV (Patient was seen 7/22 and states it is no better.)  Patient is here for follow-up of anxiety.  She states it is the same as this is because she is not taking any medication.  She was last advised to take Remeron at night which she reports made her feel sick.  Previously when she tried to take  Remeron she took it in the morning and it made her feel loopy, which was why she was advised to take it at night.  She has previously failed therapy with Valium, Paxil, and Lexapro (only took for 3 days).  She used to take clonazepam but has been weaned off of that.  Depression screen [facility] 2/9 08/02/2020 06/17/2020 04/30/2020  Decreased Interest 0 0 0  Down, Depressed, Hopeless 1 0 1  PHQ - 2 Score 1 0 1  Altered sleeping 0 0 0  Tired, decreased energy 2 1 2   Change in appetite 0 0 0  Feeling bad or failure about yourself  0 0 0  Trouble concentrating 0 0 0  Moving slowly or fidgety/restless 0 2 0  Suicidal thoughts 0 0 0  PHQ-9 Score 3 3 3   Difficult doing work/chores Not difficult at all - Not difficult at all   GAD 7 : Generalized Anxiety Score 08/02/2020 06/17/2020 04/30/2020 03/19/2020  Nervous, Anxious, on Edge 2 2 3 2   Control/stop worrying 2 1 3 1   Worry too much - different things 2 1 3 1   Trouble relaxing 2 1 3 1   Restless 2 1 3 1   Easily annoyed or irritable 2 1 3 1   Afraid - awful might happen 0 0 1 0  Total GAD 7 Score 12 7 19 7   Anxiety Difficulty Somewhat difficult -  Very difficult Somewhat difficult   New complaints: Patient also c/o vaginal discharge that is white in color.  Denies any itching.  Was treated for BV approximately 6 weeks ago.   Social history:  Relevant past medical, surgical, family and social history reviewed and updated as indicated. Interim medical history since our last visit reviewed.  Allergies and medications reviewed and updated.  DATA REVIEWED: CHART IN EPIC  ROS: Negative unless specifically indicated above in HPI.    Current Outpatient Medications:  .  albuterol (PROVENTIL) (2.5 MG/3ML) 0.083% nebulizer solution, Take 3 mLs (2.5 mg total) by nebulization every 4 (four) hours as needed for wheezing or shortness of breath., Disp: 75 mL, Rfl: 3 .  albuterol (VENTOLIN HFA) 108 (90 Base) MCG/ACT inhaler, INHALE 2 PUFFS INTO THE LUNGS EVERY 4  (FOUR) HOURS AS NEEDED FOR WHEEZING OR SHORTNESS OF BREATH., Disp: 18 g, Rfl: 4 .  amLODipine (NORVASC) 10 MG tablet, Take 1 tablet (10 mg total) by mouth daily., Disp: 90 tablet, Rfl: 1 .  Fluticasone-Umeclidin-Vilant (TRELEGY ELLIPTA) 100-62.5-25 MCG/INH AEPB, Inhale 1 Inhaler into the lungs daily., Disp: 1 each, Rfl: 11 .  losartan (COZAAR) 50 MG tablet, Take 2 tablets (100 mg total) by mouth daily., Disp: 180 tablet, Rfl: 1 .  mirtazapine (REMERON) 15 MG tablet, Take 15 mg by mouth at bedtime., Disp: , Rfl:  .  rosuvastatin (CRESTOR) 10 MG tablet, Take 1 tablet (10 mg total) by mouth daily., Disp: 90 tablet, Rfl: 1   Allergies  Allergen Reactions  . Ivp Dye [Iodinated Diagnostic Agents]   . Lexapro [Escitalopram] Nausea And Vomiting  . Other   . Penicillins   . Codeine Rash   Past Medical History:  Diagnosis Date  . Allergic rhinitis, cause unspecified   . Anxiety state, unspecified   . Chronic airway obstruction, not elsewhere classified   . Depressive disorder, not elsewhere classified   . Essential hypertension, benign   . Fatigue   . Hyperlipidemia   . Lower back pain   . Menopause   . Panic disorder without agoraphobia   . Seborrheic keratosis   . Type II or unspecified type diabetes mellitus without mention of complication, not stated as uncontrolled   . Unspecified arthropathy, shoulder region     Past Surgical History:  Procedure Laterality Date  . APPENDECTOMY    . BREAST BIOPSY  12/2012   benign  . CESAREAN SECTION    . TONSILLECTOMY    . TUBAL LIGATION      Social History   Socioeconomic History  . Marital status: Single    Spouse name: Not on file  . Number of children: Not on file  . Years of education: Not on file  . Highest education level: Not on file  Occupational History  . Not on file  Tobacco Use  . Smoking status: Current Every Day Smoker    Packs/day: 1.50    Years: 30.00    Pack years: 45.00    Types: Cigarettes  . Smokeless tobacco:  Never Used  Vaping Use  . Vaping Use: Never used  Substance and Sexual Activity  . Alcohol use: Yes    Alcohol/week: 12.0 standard drinks    Types: 12 Cans of beer per week    Comment: 2 drinks/day or fewer  . Drug use: Not Currently    Types: Marijuana  . Sexual activity: Yes    Partners: Male    Birth control/protection: Surgical  Other Topics Concern  . Not on  file  Social History Narrative   Originally from [place]. Always lived in [place]. 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 She has a dog currently. Remote exposure to love birds. No mold exposure.    Social Determinants of Health   Financial Resource Strain: Low Risk   . Difficulty of Paying Living Expenses: Not very hard  Food Insecurity: No Food Insecurity  . Worried About Programme researcher, broadcasting/film/video in the Last Year: Never true  . Ran Out of Food in the Last Year: Never true  Transportation Needs: No Transportation Needs  . Lack of Transportation (Medical): No  . Lack of Transportation (Non-Medical): No  Physical Activity: Inactive  . Days of Exercise per Week: 0 days  . Minutes of Exercise per Session: 0 min  Stress: Stress Concern Present  . Feeling of Stress : Rather much  Social Connections: Socially Isolated  . Frequency of Communication with Friends and Family: Once a week  . Frequency of Social Gatherings with Friends and Family: Never  . Attends Religious Services: Never  . Active Member of Clubs or Organizations: No  . Attends Banker Meetings: Never  . Marital Status: Married  Catering manager Violence: Not At Risk  . Fear of Current or Ex-Partner: No  . Emotionally Abused: No  . Physically Abused: No  . Sexually Abused: No        Objective:    BP (!) 96/59   Pulse 91   Temp 97.9 F (36.6 C) (Temporal)   Ht 5' 1.5" (1.562 m)   Wt 90 lb 9.6 oz (41.1 kg)   SpO2 99%   BMI 16.84 kg/m   Wt Readings from Last 3 Encounters:  07/30/20 90 lb 9.6  oz (41.1 kg)  06/17/20 93 lb 6.4 oz (42.4 kg)  04/30/20 90 lb 9.6 oz (41.1 kg)    Physical Exam Vitals reviewed. Exam conducted with a chaperone present.  Constitutional:      General: She is not in acute distress.    Appearance: Normal appearance. She is underweight. She is not ill-appearing, toxic-appearing or diaphoretic.  HENT:     Head: Normocephalic and atraumatic.  Eyes:     General: No scleral icterus.       Right eye: No discharge.        Left eye: No discharge.     Conjunctiva/sclera: Conjunctivae normal.  Cardiovascular:     Rate and Rhythm: Normal rate and regular rhythm.     Heart sounds: Normal heart sounds. No murmur heard.  No friction rub. No gallop.   Pulmonary:     Effort: Pulmonary effort is normal. No respiratory distress.     Breath sounds: Normal breath sounds. No stridor. No wheezing, rhonchi or rales.  Genitourinary:    Pubic Area: No rash or pubic lice.      Labia:        Right: No rash, tenderness, lesion or injury.        Left: No rash, tenderness, lesion or injury.      Vagina: No signs of injury and foreign body. Vaginal discharge (white) present. No erythema, tenderness, bleeding, lesions or prolapsed vaginal walls.     Cervix: Normal.     Uterus: Normal.      Adnexa: Right adnexa normal and left adnexa normal.  Musculoskeletal:        General: Normal range of motion.     Cervical back: Normal range of motion.  Skin:  General: Skin is warm and dry.     Capillary Refill: Capillary refill takes less than 2 seconds.  Neurological:     General: No focal deficit present.     Mental Status: She is alert and oriented to person, place, and time. Mental status is at baseline.  Psychiatric:        Mood and Affect: Mood normal.        Behavior: Behavior normal.        Thought Content: Thought content normal.        Judgment: Judgment normal.     No results found for: TSH Lab Results  Component Value Date   WBC 6.9 03/19/2020   HGB 15.2  03/19/2020   HCT 44.0 03/19/2020   MCV 104 (H) 03/19/2020   PLT 195 03/19/2020   Lab Results  Component Value Date   NA 141 03/19/2020   K 3.7 03/19/2020   CO2 24 03/19/2020   GLUCOSE 89 03/19/2020   BUN 8 03/19/2020   CREATININE 0.84 03/19/2020   BILITOT 0.4 03/19/2020   ALKPHOS 55 03/19/2020   AST 23 03/19/2020   ALT 11 03/19/2020   PROT 6.8 03/19/2020   ALBUMIN 4.6 03/19/2020   CALCIUM 9.8 03/19/2020   Lab Results  Component Value Date   CHOL 167 03/19/2020   Lab Results  Component Value Date   HDL 71 03/19/2020   Lab Results  Component Value Date   LDLCALC 75 03/19/2020   Lab Results  Component Value Date   TRIG 120 03/19/2020   Lab Results  Component Value Date   CHOLHDL 2.4 03/19/2020   Lab Results  Component Value Date   HGBA1C 5.3 03/19/2020

## 2020-07-31 ENCOUNTER — Encounter: Payer: Self-pay | Admitting: Family Medicine

## 2020-07-31 ENCOUNTER — Other Ambulatory Visit: Payer: Self-pay | Admitting: Family Medicine

## 2020-07-31 DIAGNOSIS — E1121 Type 2 diabetes mellitus with diabetic nephropathy: Secondary | ICD-10-CM

## 2020-07-31 LAB — CYTOLOGY - PAP
Chlamydia: NEGATIVE
Comment: NEGATIVE
Comment: NEGATIVE
Comment: NORMAL
Diagnosis: NEGATIVE
High risk HPV: NEGATIVE
Neisseria Gonorrhea: NEGATIVE

## 2020-07-31 LAB — HM DIABETES EYE EXAM

## 2020-08-02 ENCOUNTER — Encounter: Payer: Self-pay | Admitting: Family Medicine

## 2020-08-11 ENCOUNTER — Other Ambulatory Visit: Payer: Self-pay | Admitting: Family Medicine

## 2020-08-11 DIAGNOSIS — F411 Generalized anxiety disorder: Secondary | ICD-10-CM

## 2020-08-13 ENCOUNTER — Telehealth: Payer: Self-pay | Admitting: Family Medicine

## 2020-08-13 NOTE — Telephone Encounter (Signed)
Did she take the 2nd dose 3 days later? If yes, I recommend she repeat wet prep.

## 2020-08-14 NOTE — Telephone Encounter (Signed)
Patient did take med as directed. Made appt for Monday for a recheck and wet prep.

## 2020-08-15 ENCOUNTER — Telehealth: Payer: Self-pay | Admitting: Emergency Medicine

## 2020-08-15 NOTE — Telephone Encounter (Signed)
Spoke with patient, she ran out of her inhaler, Trelegy ellipta 100 1 week ago.  We have no samples in the closet to provide to her.  I will send this to the pharmacy team to see what is comparable to the trelegy and then notify Dr. Delton Coombes and the patient.  Pharmacy team, Can you see what is on patient's preferred list that is comparable to the Trelegy elipta 100?  Thank you.

## 2020-08-18 ENCOUNTER — Ambulatory Visit: Payer: Medicare Other | Admitting: Family

## 2020-08-18 NOTE — Telephone Encounter (Signed)
I am ok with a test claim, or have the patient provide [place] with her insurance formulary so that I can pick the most affordable appropriate inhaled med.

## 2020-08-20 MED ORDER — TRELEGY ELLIPTA 100-62.5-25 MCG/INH IN AEPB: 1.0000 | INHALATION_SPRAY | Freq: Every day | RESPIRATORY_TRACT | 11 refills | Status: DC

## 2020-08-20 NOTE — Telephone Encounter (Signed)
Ran test claims for 1 month supply:  Trelegy- $142.00 copay Breztri-$142.00 copay  Patient has an unmet pharmacy deductible of $95.00, which will apply to all claims until met. Patient should try applying for patient assistance.

## 2020-08-20 NOTE — Telephone Encounter (Signed)
Called and spoke with pt letting her know the info stated by RB and Rachael and she stated to renew Rx for Trelegy. Pt stated she would contact insurance company for formulary. Nothing further needed.

## 2020-08-21 ENCOUNTER — Ambulatory Visit: Payer: Medicare Other | Admitting: Family Medicine

## 2020-08-26 ENCOUNTER — Ambulatory Visit: Payer: Medicare Other | Admitting: Family Medicine

## 2020-09-09 ENCOUNTER — Telehealth: Payer: Self-pay | Admitting: Family Medicine

## 2020-09-09 NOTE — Telephone Encounter (Signed)
Pt went to dentist 08/26/2020 and wants to let Alona Bene know that she has a yeast infection and thrush. I explained that Alona Bene will not send in antibiotic without an apt and she said she did not want antibiotic that she just wanted to talk to her. She is aware she has an apt with her Thurs but not sure if she will be able to come because she will have 1st covid shot that day at 9:00. Please call back

## 2020-09-09 NOTE — Telephone Encounter (Signed)
Patient states that dentist did not treat her for thrush and would like to have magic mouthwash sent to pharmacy.  Patient also states that dentist gave antibiotic before and after mouth surgery.  Also patient  States that bronchitis and copd is flared up at this time and will hold off on covid vaccine.  Advised patient to keep appointment

## 2020-09-09 NOTE — Telephone Encounter (Signed)
I see we have been trying to get her back in and the appointments all say cancelled. I am assuming dentist treated thrush. I will see her on Thursday - she will do great with the first COVID-19 vaccine. It is the 2nd that sometimes gets people.

## 2020-09-10 NOTE — Telephone Encounter (Signed)
Yes, I can address these things tomorrow during her appointment.

## 2020-09-11 ENCOUNTER — Other Ambulatory Visit: Payer: Self-pay

## 2020-09-11 ENCOUNTER — Ambulatory Visit (INDEPENDENT_AMBULATORY_CARE_PROVIDER_SITE_OTHER): Payer: Medicare Other | Admitting: Family Medicine

## 2020-09-11 ENCOUNTER — Encounter: Payer: Self-pay | Admitting: Family Medicine

## 2020-09-11 VITALS — BP 90/52 | HR 97 | Temp 98.7°F | Ht 61.5 in | Wt 91.6 lb

## 2020-09-11 DIAGNOSIS — F411 Generalized anxiety disorder: Secondary | ICD-10-CM

## 2020-09-11 DIAGNOSIS — N898 Other specified noninflammatory disorders of vagina: Secondary | ICD-10-CM

## 2020-09-11 DIAGNOSIS — M1990 Unspecified osteoarthritis, unspecified site: Secondary | ICD-10-CM

## 2020-09-11 DIAGNOSIS — I10 Essential (primary) hypertension: Secondary | ICD-10-CM

## 2020-09-11 DIAGNOSIS — B370 Candidal stomatitis: Secondary | ICD-10-CM

## 2020-09-11 DIAGNOSIS — J449 Chronic obstructive pulmonary disease, unspecified: Secondary | ICD-10-CM

## 2020-09-11 LAB — WET PREP FOR TRICH, YEAST, CLUE
Clue Cell Exam: NEGATIVE
Trichomonas Exam: NEGATIVE
Yeast Exam: NEGATIVE

## 2020-09-11 MED ORDER — NYSTATIN 100000 UNIT/ML MT SUSP: 5.0000 mL | Freq: Four times a day (QID) | OROMUCOSAL | 0 refills | Status: DC

## 2020-09-11 NOTE — Patient Instructions (Signed)
Stop taking Losartan. Monitor BP at home.   Glucosamine-chondroitin for joints.   Tylenol 1,000 mg four times daily as needed for pain.

## 2020-09-11 NOTE — Progress Notes (Signed)
Assessment & Plan:  1. Generalized anxiety disorder - Well controlled on current regimen.   2. Essential hypertension - Soft BP readings with Losartan 50 mg once daily, therefore medication D/C'd.  Patient will continue monitoring blood pressure at home and let me know if her readings become consistently greater than 140/90.   3. Thrush - Rx'd Nystatin and discussed the importance of patient rinsing her mouth after using her Trelegy inhaler. - nystatin (MYCOSTATIN) 100000 UNIT/ML suspension; Take 5 mLs (500,000 Units total) by mouth 4 (four) times daily. Swish and swallow. Continue x2 days after resolution of symptoms.  Dispense: 60 mL; Refill: 0  4. Vaginal discharge - WET PREP FOR TRICH, YEAST, CLUE  5. Arthritis - Tylenol for pain. Glucosamine-chondroitin for joint health.   6. Chronic obstructive pulmonary disease, unspecified COPD type [facility]) - Patient has an appointment scheduled with her pulmonologist on 09/22/2020.   Return in about 3 months (around 12/11/2020) for follow-up of chronic medication conditions.  [name], MSN, APRN, FNP-C [facility]  Subjective:    Patient ID: Jeanne Becker, female    DOB: 1956/03/25, 64 y.o.   MRN: [id number]  Patient Care Team: Gwenlyn Fudge, FNP as PCP - General (Family Medicine) Leslye Peer, MD as Consulting Physician (Pulmonary Disease)   Chief Complaint:  Chief Complaint  Patient presents with  . Anxiety    6 wk ckup  . Hypertension  . Bronchitis    COPD is bothering her    HPI: Jeanne Becker is a 64 y.o. female presenting on 09/11/2020 for Anxiety (6 wk ckup), Hypertension, and Bronchitis (COPD is bothering her)  Patient is here for follow-up of anxiety.  She was most recently started on buspirone 7.5 mg twice daily which she feels is working well for her.  GAD 7 : Generalized Anxiety Score 09/11/2020 08/02/2020 06/17/2020 04/30/2020  Nervous, Anxious, on Edge 1 2 2 3   Control/stop  worrying 0 2 1 3   Worry too much - different things 2 2 1 3   Trouble relaxing 1 2 1 3   Restless 1 2 1 3   Easily annoyed or irritable 1 2 1 3   Afraid - awful might happen 0 0 0 1  Total GAD 7 Score 6 12 7 19   Anxiety Difficulty - Somewhat difficult - Very difficult   Patient did stop taking amlodipine as directed and reports her systolic blood pressure at home still does not get higher than 110.  She has a prescription for losartan 50 mg tablets with directions to take two daily, but reports she has only been taking one daily as she did not realize the tablets were changed from 100 mg down to 50 mg.  New complaints: Patient reports she had to take antibiotics prescribed by her dentist for a procedure and she is having trouble with yeast in her mouth.  She feels like it is going down her throat.  She also reports a white discharge and wants to make sure she does not have either BV or yeast again.  Patient is wanting to know if there is a supplement she can take for joint health.  Social history:  Relevant past medical, surgical, family and social history reviewed and updated as indicated. Interim medical history since our last visit reviewed.  Allergies and medications reviewed and updated.  DATA REVIEWED: CHART IN EPIC  ROS: Negative unless specifically indicated above in HPI.    Current Outpatient Medications:  .  albuterol (PROVENTIL) (2.5 MG/3ML)  0.083% nebulizer solution, Take 3 mLs (2.5 mg total) by nebulization every 4 (four) hours as needed for wheezing or shortness of breath., Disp: 75 mL, Rfl: 3 .  albuterol (VENTOLIN HFA) 108 (90 Base) MCG/ACT inhaler, INHALE 2 PUFFS INTO THE LUNGS EVERY 4 (FOUR) HOURS AS NEEDED FOR WHEEZING OR SHORTNESS OF BREATH., Disp: 18 g, Rfl: 4 .  Fluticasone-Umeclidin-Vilant (TRELEGY ELLIPTA) 100-62.5-25 MCG/INH AEPB, Inhale 1 Inhaler into the lungs daily., Disp: 1 each, Rfl: 11 .  losartan (COZAAR) 50 MG tablet, Take 2 tablets (100 mg total) by  mouth daily., Disp: 180 tablet, Rfl: 1 .  rosuvastatin (CRESTOR) 10 MG tablet, TAKE 1 TABLET BY MOUTH EVERY DAY, Disp: 90 tablet, Rfl: 0 .  busPIRone (BUSPAR) 7.5 MG tablet, TAKE 1 TABLET (7.5 MG TOTAL) BY MOUTH 2 (TWO) TIMES DAILY., Disp: 180 tablet, Rfl: 0   Allergies  Allergen Reactions  . Ivp Dye [Iodinated Diagnostic Agents]   . Lexapro [Escitalopram] Nausea And Vomiting  . Other   . Penicillins   . Codeine Rash   Past Medical History:  Diagnosis Date  . Allergic rhinitis, cause unspecified   . Anxiety state, unspecified   . Chronic airway obstruction, not elsewhere classified   . Depressive disorder, not elsewhere classified   . Essential hypertension, benign   . Fatigue   . Hyperlipidemia   . Lower back pain   . Menopause   . Panic disorder without agoraphobia   . Seborrheic keratosis   . Type II or unspecified type diabetes mellitus without mention of complication, not stated as uncontrolled   . Unspecified arthropathy, shoulder region     Past Surgical History:  Procedure Laterality Date  . APPENDECTOMY    . BREAST BIOPSY  12/2012   benign  . CESAREAN SECTION    . TONSILLECTOMY    . TUBAL LIGATION      Social History   Socioeconomic History  . Marital status: Single    Spouse name: Not on file  . Number of children: Not on file  . Years of education: Not on file  . Highest education level: Not on file  Occupational History  . Not on file  Tobacco Use  . Smoking status: Current Every Day Smoker    Packs/day: 1.50    Years: 30.00    Pack years: 45.00    Types: Cigarettes  . Smokeless tobacco: Never Used  Vaping Use  . Vaping Use: Never used  Substance and Sexual Activity  . Alcohol use: Not Currently    Alcohol/week: 12.0 standard drinks    Types: 12 Cans of beer per week    Comment: 2 drinks/day or fewer  . Drug use: Not Currently    Types: Marijuana  . Sexual activity: Yes    Partners: Male    Birth control/protection: Surgical  Other Topics  Concern  . Not on file  Social History Narrative   Originally from [place]. Always lived in [place]. 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 Also worked in [employer] industry. She has a dog currently. Remote exposure to love birds. No mold exposure.    Social Determinants of Health   Financial Resource Strain: Low Risk   . Difficulty of Paying Living Expenses: Not very hard  Food Insecurity: No Food Insecurity  . Worried About Programme researcher, broadcasting/film/videounning Out of Food in the Last Year: Never true  . Ran Out of Food in the Last Year: Never true  Transportation Needs: No Transportation Needs  . Lack of  Transportation (Medical): No  . Lack of Transportation (Non-Medical): No  Physical Activity: Inactive  . Days of Exercise per Week: 0 days  . Minutes of Exercise per Session: 0 min  Stress: Stress Concern Present  . Feeling of Stress : Rather much  Social Connections: Socially Isolated  . Frequency of Communication with Friends and Family: Once a week  . Frequency of Social Gatherings with Friends and Family: Never  . Attends Religious Services: Never  . Active Member of Clubs or Organizations: No  . Attends Banker Meetings: Never  . Marital Status: Married  Catering manager Violence: Not At Risk  . Fear of Current or Ex-Partner: No  . Emotionally Abused: No  . Physically Abused: No  . Sexually Abused: No        Objective:    BP (!) 90/52   Pulse 97   Temp 98.7 F (37.1 C) (Temporal)   Ht 5' 1.5" (1.562 m)   Wt 91 lb 9.6 oz (41.5 kg)   BMI 17.03 kg/m   Wt Readings from Last 3 Encounters:  09/11/20 91 lb 9.6 oz (41.5 kg)  07/30/20 90 lb 9.6 oz (41.1 kg)  06/17/20 93 lb 6.4 oz (42.4 kg)    Physical Exam Vitals reviewed.  Constitutional:      General: She is not in acute distress.    Appearance: Normal appearance. She is underweight. She is not ill-appearing, toxic-appearing or diaphoretic.  HENT:     Head: Normocephalic and atraumatic.     Mouth/Throat:      Comments: White patches on tongue.  Eyes:     General: No scleral icterus.       Right eye: No discharge.        Left eye: No discharge.     Conjunctiva/sclera: Conjunctivae normal.  Cardiovascular:     Rate and Rhythm: Normal rate and regular rhythm.     Heart sounds: Normal heart sounds. No murmur heard.  No friction rub. No gallop.   Pulmonary:     Effort: Pulmonary effort is normal. No respiratory distress.     Breath sounds: Normal breath sounds. No stridor. No wheezing, rhonchi or rales.  Musculoskeletal:        General: Normal range of motion.     Cervical back: Normal range of motion.  Skin:    General: Skin is warm and dry.     Capillary Refill: Capillary refill takes less than 2 seconds.  Neurological:     General: No focal deficit present.     Mental Status: She is alert and oriented to person, place, and time. Mental status is at baseline.  Psychiatric:        Mood and Affect: Mood normal.        Behavior: Behavior normal.        Thought Content: Thought content normal.        Judgment: Judgment normal.     No results found for: TSH Lab Results  Component Value Date   WBC 6.9 03/19/2020   HGB 15.2 03/19/2020   HCT 44.0 03/19/2020   MCV 104 (H) 03/19/2020   PLT 195 03/19/2020   Lab Results  Component Value Date   NA 141 03/19/2020   K 3.7 03/19/2020   CO2 24 03/19/2020   GLUCOSE 89 03/19/2020   BUN 8 03/19/2020   CREATININE 0.84 03/19/2020   BILITOT 0.4 03/19/2020   ALKPHOS 55 03/19/2020   AST 23 03/19/2020   ALT 11 03/19/2020  PROT 6.8 03/19/2020   ALBUMIN 4.6 03/19/2020   CALCIUM 9.8 03/19/2020   Lab Results  Component Value Date   CHOL 167 03/19/2020   Lab Results  Component Value Date   HDL 71 03/19/2020   Lab Results  Component Value Date   LDLCALC 75 03/19/2020   Lab Results  Component Value Date   TRIG 120 03/19/2020   Lab Results  Component Value Date   CHOLHDL 2.4 03/19/2020   Lab Results  Component Value Date   HGBA1C  5.3 03/19/2020

## 2020-09-14 ENCOUNTER — Encounter: Payer: Self-pay | Admitting: Family Medicine

## 2020-09-22 ENCOUNTER — Ambulatory Visit: Payer: Medicare Other | Admitting: Emergency Medicine

## 2020-09-22 ENCOUNTER — Other Ambulatory Visit: Payer: Self-pay

## 2020-09-22 ENCOUNTER — Encounter: Payer: Self-pay | Admitting: Emergency Medicine

## 2020-09-22 DIAGNOSIS — J309 Allergic rhinitis, unspecified: Secondary | ICD-10-CM | POA: Diagnosis not present

## 2020-09-22 DIAGNOSIS — F17200 Nicotine dependence, unspecified, uncomplicated: Secondary | ICD-10-CM

## 2020-09-22 DIAGNOSIS — Z029 Encounter for administrative examinations, unspecified: Secondary | ICD-10-CM

## 2020-09-22 DIAGNOSIS — J449 Chronic obstructive pulmonary disease, unspecified: Secondary | ICD-10-CM | POA: Diagnosis not present

## 2020-09-22 MED ORDER — FLUTICASONE PROPIONATE 50 MCG/ACT NA SUSP: 2.0000 | Freq: Every day | NASAL | 2 refills | Status: DC

## 2020-09-22 MED ORDER — FLUCONAZOLE 100 MG PO TABS: 100.0000 mg | ORAL_TABLET | Freq: Every day | ORAL | 0 refills | Status: DC

## 2020-09-22 NOTE — Assessment & Plan Note (Signed)
Discussed decreasing.  She is willing to try to cut down to half a pack a day.

## 2020-09-22 NOTE — Assessment & Plan Note (Signed)
Significantly disease.  Does not appear to be flaring but she does have daily symptoms including white mucus, cough.  We discussed possibly changing her maintenance bronchodilator due to cost but I think in the end if were going to keep her on ICS, LAMA, LABA then this is the best option.  Continue Trelegy, albuterol as needed.  We talked about decreasing her smoking today.  Also talked about getting both the COVID-19 vaccine and the flu shot.

## 2020-09-22 NOTE — Addendum Note (Signed)
Addended by: Marylynn Pearson on: 09/22/2020 09:37 AM   Modules accepted: Orders

## 2020-09-22 NOTE — Assessment & Plan Note (Signed)
She just started taking fluticasone nasal spray.  We will send a prescription for this.

## 2020-09-22 NOTE — Patient Instructions (Addendum)
Please continue Trelegy 1 inhalation once daily.  Rinse and gargle after using. Keep your albuterol available use 2 puffs if needed for shortness of breath, chest tightness, wheezing. Continue fluticasone nasal spray, 2 sprays each nostril once daily.  We will send a prescription for this Please take fluconazole 100 mg once a day for 5 days.  This is for thrush. You need both your flu shot and COVID-19 vaccine.  At this time I think you should prioritize the Covid vaccine, go ahead and set up a time to get this soon as possible.  You may be able to get a flu shot at the same time as your first Covid shot.  Please ask at the pharmacy. Work hard on decreasing your smoking.  Our goal will be for you to cut down to half a pack a day by next visit. Follow with Dr Delton Coombes in 6 months or sooner if you have any problems

## 2020-09-22 NOTE — Progress Notes (Signed)
   Subjective:    Patient ID: Jeanne Becker, female    DOB: [birth year], 64 y.o.   MRN: [id number]  HPI  ROV 09/21/2019 --64 year old woman, smoker with moderately severe obstruction and a positive bronchodilator response.  She is on Trelegy for her COPD.  She has albuterol uses approximately 3x a week.  Also with allergic rhinitis, significant sinus congestion.  She participates in the lung cancer screening program, most recent scan was 10/18/2018, she is due for a repeat scan this October.  Her pneumonia vaccine is up-to-date until after age 65. She reports that she is not getting out much, hasn't really challenged her breathing w exertion. No flares since last time. She she is tolerating Trelegy. She has had a lot of difficulty with sinus congestion and allergies. She has used flonase. She has a neti-pot, hasn't used it yet. Not on an anti-histamine.   ROV 09/22/20 --64 year old woman, heavy smoker with moderately severe obstruction and COPD, positive bronchodilator response, allergic rhinitis.  She has been managed with Trelegy and albuterol which she uses approximately 2-3x a day. She was treated for an acute flare in 12/2019, then received abx again from urgent care. She was treated for thrush, yeast vaginitis recently by her PCP.  It looks like on a test claim both Trelegy and Breztri are $142. She describes some sternal pain. Has some cough with white phlegm, mild sinus clear drainage. She is on flonase, got it OTC, needs a script. Smoking 1 pk a day  She will need one of the pneumonia vaccines after age 64. She hasn't had the COVID vaccine.    Review of Systems As per HPI     Objective:   Physical Exam Vitals:   09/22/20 0905  BP: 124/70  Pulse: 94  Temp: 98 F (36.7 C)  TempSrc: Temporal  SpO2: 98%  Weight: 91 lb 12.8 oz (41.6 kg)  Height: 5' 1.5" (1.562 m)   Gen: Pleasant, thin woman, in no distress,  normal affect  ENT: No lesions,  mouth clear, some white coating on  posterior tongue, no postnasal drip  Neck: No JVD, no stridor  Lungs: No use of accessory muscle,  clear without rales or rhonchi  Cardiovascular: RRR, heart sounds normal, no murmur or gallops, no peripheral edema  Musculoskeletal: No deformities, no cyanosis or clubbing  Neuro: alert, non focal  Skin: Warm, no lesions or rashes      Assessment & Plan:  COPD, moderately severe Significantly disease.  Does not appear to be flaring but she does have daily symptoms including white mucus, cough.  We discussed possibly changing her maintenance bronchodilator due to cost but I think in the end if were going to keep her on ICS, LAMA, LABA then this is the best option.  Continue Trelegy, albuterol as needed.  We talked about decreasing her smoking today.  Also talked about getting both the COVID-19 vaccine and the flu shot.  Chronic allergic rhinitis She just started taking fluticasone nasal spray.  We will send a prescription for this.  Tobacco use disorder Discussed decreasing.  She is willing to try to cut down to half a pack a day.  Levy Pupa, MD, PhD 09/22/2020, 9:23 AM [facility] [phone] or if no answer [phone]

## 2020-09-25 ENCOUNTER — Telehealth: Payer: Self-pay | Admitting: Family Medicine

## 2020-09-25 NOTE — Telephone Encounter (Signed)
I have received a form for a handicap placard for the patient.  Can we please call her and ask her why she feels she needs this?

## 2020-09-25 NOTE — Telephone Encounter (Signed)
Patient states that she has COPD and when it is hot outside it is hard for her to walk far.  States her pulmonologist advised her to get a handy cap card.

## 2020-09-25 NOTE — Telephone Encounter (Signed)
Please let patient know to be qualified due to COPD she has to have a FEV1 less than 1 L and her last one was 1.14 in 2018.  I imagine it has dropped further in the past 3 years but without spirometry to prove this I cannot use that as the reason for her handicap placard.  I am however going to sign it and say that she cannot walk 200 feet without stopping to rest as I would imagine if she is having trouble breathing she needs to rest occasionally.

## 2020-09-25 NOTE — Telephone Encounter (Signed)
Voice mail left for patient.

## 2020-10-12 ENCOUNTER — Other Ambulatory Visit: Payer: Self-pay | Admitting: Emergency Medicine

## 2020-10-12 ENCOUNTER — Other Ambulatory Visit: Payer: Self-pay | Admitting: Family Medicine

## 2020-10-12 DIAGNOSIS — E1121 Type 2 diabetes mellitus with diabetic nephropathy: Secondary | ICD-10-CM

## 2020-10-17 ENCOUNTER — Other Ambulatory Visit: Payer: Self-pay | Admitting: Family Medicine

## 2020-10-17 DIAGNOSIS — B370 Candidal stomatitis: Secondary | ICD-10-CM

## 2020-10-20 NOTE — Telephone Encounter (Signed)
Please have her follow up if she is still having thrush problems.  She may need alternative treatment

## 2020-10-27 ENCOUNTER — Telehealth: Payer: Self-pay | Admitting: Acute Care

## 2020-10-27 ENCOUNTER — Telehealth: Payer: Self-pay

## 2020-10-27 MED ORDER — FLUCONAZOLE 100 MG PO TABS: 100.0000 mg | ORAL_TABLET | Freq: Every day | ORAL | 0 refills | Status: DC

## 2020-10-27 NOTE — Telephone Encounter (Signed)
Patient aware and verbalized understanding. °

## 2020-10-27 NOTE — Telephone Encounter (Signed)
Is using inhalers more right now d/t change of weather. Pulmonologist recently gave her fluconazole which cleared it up better that the nystatin suspension. She has not picked up the nystatin yet. Can she get the Fluconazole refill instead

## 2020-10-27 NOTE — Telephone Encounter (Signed)
I have the [facility] scheduled for Jeanne Becker and 11/25/2020 @ 10:00am at [facility] and she is aware of this appt  When I spoke with her about the [facility] CT appt she wanted to know if she could get another refill for the pills that Dr. Delton Coombes gave her back in September for at rash in her mouth because of her inhalers. She states she has been using the inhalers more often lately

## 2020-10-27 NOTE — Telephone Encounter (Signed)
Called and spoke with Patient. Dr. Kavin Leech recommendations given.  Understanding stated. Fluconazole prescription sent to requested pharmacy.  Nothing further at this time.

## 2020-10-27 NOTE — Telephone Encounter (Signed)
As stated 1 week ago: "Please have her follow up if she is still having thrush problems.  She may need alternative treatment"

## 2020-10-27 NOTE — Telephone Encounter (Signed)
Patient states she cant take the nystatin for thrush she wants diflucan sent in from her inhaler. Patient uses [facility]

## 2020-10-27 NOTE — Telephone Encounter (Signed)
Ok to give fluconazole 100mg  qd x 3 days.

## 2020-10-27 NOTE — Telephone Encounter (Signed)
She can take tylenol or motrin as needed.

## 2020-10-27 NOTE — Telephone Encounter (Signed)
Called and spoke with Patient.  Patient requested a prescription for fluconazole to be sent to [facility]. Patient stated she is rinsing her mouth after inhaler use.  Patient stated she is using Trelegy 1 time a day, and albuterol QID.  Patient stated with the change in weather she has been having to use albuterol QID. Patient stated at LOV fluconazole cleared her mouth quick.  Message routed to Dr. Delton Coombes to advise

## 2020-10-28 ENCOUNTER — Other Ambulatory Visit: Payer: Self-pay | Admitting: *Deleted

## 2020-10-28 DIAGNOSIS — F17210 Nicotine dependence, cigarettes, uncomplicated: Secondary | ICD-10-CM

## 2020-10-28 DIAGNOSIS — Z87891 Personal history of nicotine dependence: Secondary | ICD-10-CM

## 2020-11-03 ENCOUNTER — Ambulatory Visit (INDEPENDENT_AMBULATORY_CARE_PROVIDER_SITE_OTHER): Payer: Medicare Other | Admitting: Family

## 2020-11-03 ENCOUNTER — Encounter: Payer: Self-pay | Admitting: Family

## 2020-11-03 DIAGNOSIS — J441 Chronic obstructive pulmonary disease with (acute) exacerbation: Secondary | ICD-10-CM | POA: Diagnosis not present

## 2020-11-03 DIAGNOSIS — F17200 Nicotine dependence, unspecified, uncomplicated: Secondary | ICD-10-CM | POA: Diagnosis not present

## 2020-11-03 MED ORDER — AZITHROMYCIN 250 MG PO TABS: ORAL_TABLET | ORAL | 0 refills | Status: DC

## 2020-11-03 NOTE — Progress Notes (Signed)
Virtual Visit via telephone Note Due to COVID-19 pandemic this visit was conducted virtually. This visit type was conducted due to national recommendations for restrictions regarding the COVID-19 Pandemic (e.g. social distancing, sheltering in place) in an effort to limit this patient's exposure and mitigate transmission in our community. All issues noted in this document were discussed and addressed.  A physical exam was not performed with this format.  I connected with Jeanne Becker on 11/03/20 at 4:03 pm  by telephone and verified that I am speaking with the correct person using two identifiers. Jeanne Becker is currently located at home and no one is currently with her during visit. The provider, Jannifer Rodney, FNP is located in their office at time of visit.  I discussed the limitations, risks, security and privacy concerns of performing an evaluation and management service by telephone and the availability of in person appointments. I also discussed with the patient that there may be a patient responsible charge related to this service. The patient expressed understanding and agreed to proceed.   History and Present Illness:  Cough This is a new problem. The current episode started more than 1 month ago. The problem has been waxing and waning. The problem occurs every few minutes. The cough is productive of sputum. Associated symptoms include ear congestion, ear pain, nasal congestion, rhinorrhea, shortness of breath and wheezing. Pertinent negatives include no chills, fever, headaches, myalgias, postnasal drip or sore throat. The symptoms are aggravated by lying down. Risk factors for lung disease include smoking/tobacco exposure. She has tried OTC inhaler for the symptoms. The treatment provided mild relief. Her past medical history is significant for emphysema.      Review of Systems  Constitutional: Negative for chills and fever.  HENT: Positive for ear pain and rhinorrhea.  Negative for postnasal drip and sore throat.   Respiratory: Positive for cough, shortness of breath and wheezing.   Musculoskeletal: Negative for myalgias.  Neurological: Negative for headaches.     Observations/Objective: No SOB or distress noted, hoarse voice   Assessment and Plan: 1. COPD exacerbation (HCC) - Take meds as prescribed - Use a cool mist humidifier  -Use saline nose sprays frequently -Force fluids -For any cough or congestion  Use plain Mucinex- regular strength or max strength is fine -For fever or aces or pains- take tylenol or ibuprofen. -Throat lozenges if help -Smoking cessation discussed Call if symptoms worsen or do not improve - azithromycin (ZITHROMAX) 250 MG tablet; Take 500 mg once, then 250 mg for four days  Dispense: 6 tablet; Refill: 0  2. Current smoker Smoking cessation discussed     I discussed the assessment and treatment plan with the patient. The patient was provided an opportunity to ask questions and all were answered. The patient agreed with the plan and demonstrated an understanding of the instructions.   The patient was advised to call back or seek an in-person evaluation if the symptoms worsen or if the condition fails to improve as anticipated.  The above assessment and management plan was discussed with the patient. The patient verbalized understanding of and has agreed to the management plan. Patient is aware to call the clinic if symptoms persist or worsen. Patient is aware when to return to the clinic for a follow-up visit. Patient educated on when it is appropriate to go to the emergency department.   Time call ended:  4:14 pm   I provided 11 minutes of non-face-to-face time during this encounter.  Evelina Dun, FNP

## 2020-11-11 ENCOUNTER — Other Ambulatory Visit: Payer: Self-pay | Admitting: Family Medicine

## 2020-11-11 DIAGNOSIS — F411 Generalized anxiety disorder: Secondary | ICD-10-CM

## 2020-11-25 ENCOUNTER — Other Ambulatory Visit: Payer: Self-pay

## 2020-11-25 ENCOUNTER — Ambulatory Visit (HOSPITAL_COMMUNITY)
Admission: RE | Admit: 2020-11-25 | Discharge: 2020-11-25 | Disposition: A | Discharge status: Home / Self Care | Payer: Medicare Other | Source: Ambulatory Visit | Attending: Acute Care | Admitting: Acute Care

## 2020-11-25 ENCOUNTER — Ambulatory Visit (HOSPITAL_COMMUNITY): Payer: Medicare Other

## 2020-11-25 DIAGNOSIS — Z87891 Personal history of nicotine dependence: Secondary | ICD-10-CM | POA: Insufficient documentation

## 2020-11-25 DIAGNOSIS — F17210 Nicotine dependence, cigarettes, uncomplicated: Secondary | ICD-10-CM

## 2020-11-27 ENCOUNTER — Telehealth: Payer: Self-pay | Admitting: Emergency Medicine

## 2020-11-27 NOTE — Telephone Encounter (Signed)
Primary Pulmonologist: Dr. Delton Coombes  Last office visit and with whom: 09/22/20 RB What do we see them for (pulmonary problems): COPD Chronic allergic Rhinitis Last OV assessment/plan:   Instructions  Please continue Trelegy 1 inhalation once daily.  Rinse and gargle after using. Keep your albuterol available use 2 puffs if needed for shortness of breath, chest tightness, wheezing. Continue fluticasone nasal spray, 2 sprays each nostril once daily.  We will send a prescription for this Please take fluconazole 100 mg once a day for 5 days.  This is for thrush. You need both your flu shot and COVID-19 vaccine.  At this time I think you should prioritize the Covid vaccine, go ahead and set up a time to get this soon as possible.  You may be able to get a flu shot at the same time as your first Covid shot.  Please ask at the pharmacy. Work hard on decreasing your smoking.  Our goal will be for you to cut down to half a pack a day by next visit. Follow with Dr Delton Coombes in 6 months or sooner if you have any problems       Was appointment offered to patient (explain)?  Patient lives in [place]. She could get virtual visit with PCP tomorrow. She just was told by Dr. Herbie Baltimore "she may have symptoms with change in weather" AND with [place] air [place] she wasn't sure what she may need.    Reason for call: Patient has had worsening cough , ears feeling full, dry nose with blood, and neck feels swollen around lymph nodes. Mucus is white when she can cough it up.   She is taking some sinus pain medication from her Husband that he gets from work to help, but not working. No fever, no body aches, no flu vaccine this year, but has had covid vaccines, but not time for booster yet.    Dr. Delton Coombes please advise.    Allergies  Allergen Reactions   Ivp Dye [Iodinated Diagnostic Agents]    Lexapro [Escitalopram] Nausea And Vomiting   Other    Penicillins    Codeine Rash     Immunization History  Administered Date(s) Administered   DTaP 01/28/2014   Influenza Whole 10/07/2012   Influenza, High Dose Seasonal PF 11/11/2016, 09/15/2017   Influenza,inj,Quad PF,6+ Mos 11/06/2012, 08/31/2013, 01/08/2015, 02/19/2016, 09/14/2018, 09/21/2019   Influenza-Unspecified 11/06/2012, 02/19/2016   Pneumococcal Conjugate-13 07/17/2014   Pneumococcal Polysaccharide-23 08/31/2013   Tdap 01/29/2014

## 2020-11-27 NOTE — Telephone Encounter (Signed)
Please have her continue fluticasone nasal spray Start doing nasal sinus rinses once a day Use saline nasal spray as needed to keep nasal passages moist and prevent bleeding Try starting loratadine 10mg  daily Ok to use the OTC decongestants as directed - but these may dry her nose, so definitely use the saline spray.

## 2020-11-27 NOTE — Telephone Encounter (Signed)
Called spoke with patient.  Let her know all Dr. Kavin Leech recommendations., patient wrote down and read back.  Voiced understanding.  Nothing further needed at this time.

## 2020-12-01 ENCOUNTER — Telehealth: Payer: Self-pay | Admitting: Emergency Medicine

## 2020-12-01 DIAGNOSIS — F17210 Nicotine dependence, cigarettes, uncomplicated: Secondary | ICD-10-CM

## 2020-12-01 DIAGNOSIS — Z87891 Personal history of nicotine dependence: Secondary | ICD-10-CM

## 2020-12-01 NOTE — Progress Notes (Signed)
Please call patient and let them  know their  low dose Ct was read as a Lung RADS 2: nodules that are benign in appearance and behavior with a very low likelihood of becoming a clinically active cancer due to size or lack of growth. Recommendation per radiology is for a repeat LDCT in 12 months. .Please let them  know we will order and schedule their  annual screening scan for 10/2021. Please let them  know there was notation of age advanced CAD on their  scan.  Please remind the patient  that this is a non-gated exam therefore degree or severity of disease  cannot be determined. Please have them  follow up with their PCP regarding potential risk factor modification, dietary therapy or pharmacologic therapy if clinically indicated. Pt.  is  currently on statin therapy. Please place order for annual  screening scan for  10/2021 and fax results to PCP. Thanks so much.  Jeanne Becker, this patient has age advanced CAD per the scan. She is not followed by cards that I can see in Epic. Can you ask her to follow up with her PCP regarding cards follow up? Thanks so much

## 2020-12-01 NOTE — Telephone Encounter (Signed)
Pt informed of CT results per Sarah Groce, NP.  PT verbalized understanding.  Copy sent to PCP.  Order placed for 1 yr f/u CT.  

## 2020-12-11 ENCOUNTER — Ambulatory Visit: Payer: Medicare Other

## 2020-12-24 ENCOUNTER — Ambulatory Visit: Payer: Medicare Other

## 2020-12-29 ENCOUNTER — Ambulatory Visit (INDEPENDENT_AMBULATORY_CARE_PROVIDER_SITE_OTHER): Payer: Medicare Other | Admitting: Family

## 2020-12-29 ENCOUNTER — Encounter: Payer: Self-pay | Admitting: Family

## 2020-12-29 DIAGNOSIS — J441 Chronic obstructive pulmonary disease with (acute) exacerbation: Secondary | ICD-10-CM | POA: Diagnosis not present

## 2020-12-29 MED ORDER — PREDNISONE 20 MG PO TABS: ORAL_TABLET | ORAL | 0 refills | Status: DC

## 2020-12-29 NOTE — Progress Notes (Signed)
   Virtual Visit via telephone Note Due to COVID-19 pandemic this visit was conducted virtually. This visit type was conducted due to national recommendations for restrictions regarding the COVID-19 Pandemic (e.g. social distancing, sheltering in place) in an effort to limit this patient's exposure and mitigate transmission in our community. All issues noted in this document were discussed and addressed.  A physical exam was not performed with this format.  I connected with Jeanne Becker on 12/29/20 at 10:53 AM  by telephone and verified that I am speaking with the correct person using two identifiers. Jeanne Becker is currently located at home and no one is currently with her during visit. The provider, Jannifer Rodney, FNP is located in their office at time of visit.  I discussed the limitations, risks, security and privacy concerns of performing an evaluation and management service by telephone and the availability of in person appointments. I also discussed with the patient that there may be a patient responsible charge related to this service. The patient expressed understanding and agreed to proceed.   History and Present Illness:  Cough This is a new problem. The current episode started 1 to 4 weeks ago. The problem has been waxing and waning. The problem occurs every few minutes. The cough is productive of sputum. Associated symptoms include myalgias, nasal congestion and wheezing. Pertinent negatives include no ear congestion, ear pain, fever, sore throat or shortness of breath. Associated symptoms comments: Hoarse voice. She has tried OTC cough suppressant and rest for the symptoms. The treatment provided mild relief. Her past medical history is significant for COPD and emphysema.  Nicotine Dependence Presents for follow-up visit. Symptoms are negative for sore throat. Her urge triggers include company of smokers. The symptoms have been stable. She smokes 1 pack of cigarettes per day.  Compliance with prior treatments has been good.      Review of Systems  Constitutional: Negative for fever.  HENT: Negative for ear pain and sore throat.   Respiratory: Positive for cough and wheezing. Negative for shortness of breath.   Musculoskeletal: Positive for myalgias.  All other systems reviewed and are negative.    Observations/Objective: No SOB or distress noted, hoarse voice  Assessment and Plan: 1. COPD exacerbation (HCC) Rest Force fluids  Tylenol as needed  Low carb diet RTO if symptoms worsen or do not improve  - predniSONE (DELTASONE) 20 MG tablet; Take 3 tabs daily for 1 week, then 2 tabs for 1 week, then 1 tab for one week  Dispense: 42 tablet; Refill: 0     I discussed the assessment and treatment plan with the patient. The patient was provided an opportunity to ask questions and all were answered. The patient agreed with the plan and demonstrated an understanding of the instructions.   The patient was advised to call back or seek an in-person evaluation if the symptoms worsen or if the condition fails to improve as anticipated.  The above assessment and management plan was discussed with the patient. The patient verbalized understanding of and has agreed to the management plan. Patient is aware to call the clinic if symptoms persist or worsen. Patient is aware when to return to the clinic for a follow-up visit. Patient educated on when it is appropriate to go to the emergency department.   Time call ended:  11:04 AM   I provided 11 minutes of non-face-to-face time during this encounter.    Jannifer Rodney, FNP

## 2021-01-02 ENCOUNTER — Ambulatory Visit: Payer: Medicare Other

## 2021-01-13 ENCOUNTER — Other Ambulatory Visit: Payer: Self-pay | Admitting: Family

## 2021-01-13 DIAGNOSIS — J441 Chronic obstructive pulmonary disease with (acute) exacerbation: Secondary | ICD-10-CM

## 2021-01-23 ENCOUNTER — Ambulatory Visit (INDEPENDENT_AMBULATORY_CARE_PROVIDER_SITE_OTHER): Payer: Medicare Other | Admitting: Family Medicine

## 2021-01-23 ENCOUNTER — Encounter: Payer: Self-pay | Admitting: Family Medicine

## 2021-01-23 ENCOUNTER — Other Ambulatory Visit: Payer: Self-pay

## 2021-01-23 VITALS — BP 140/82 | HR 111 | Temp 97.8°F | Ht 61.5 in | Wt 98.2 lb

## 2021-01-23 DIAGNOSIS — J449 Chronic obstructive pulmonary disease, unspecified: Secondary | ICD-10-CM | POA: Diagnosis not present

## 2021-01-23 DIAGNOSIS — M1990 Unspecified osteoarthritis, unspecified site: Secondary | ICD-10-CM | POA: Insufficient documentation

## 2021-01-23 DIAGNOSIS — F3342 Major depressive disorder, recurrent, in full remission: Secondary | ICD-10-CM

## 2021-01-23 DIAGNOSIS — B370 Candidal stomatitis: Secondary | ICD-10-CM

## 2021-01-23 DIAGNOSIS — I700 Atherosclerosis of aorta: Secondary | ICD-10-CM | POA: Diagnosis not present

## 2021-01-23 DIAGNOSIS — N898 Other specified noninflammatory disorders of vagina: Secondary | ICD-10-CM

## 2021-01-23 DIAGNOSIS — F411 Generalized anxiety disorder: Secondary | ICD-10-CM

## 2021-01-23 DIAGNOSIS — F17200 Nicotine dependence, unspecified, uncomplicated: Secondary | ICD-10-CM

## 2021-01-23 DIAGNOSIS — E782 Mixed hyperlipidemia: Secondary | ICD-10-CM | POA: Diagnosis not present

## 2021-01-23 DIAGNOSIS — R8290 Unspecified abnormal findings in urine: Secondary | ICD-10-CM | POA: Diagnosis not present

## 2021-01-23 LAB — URINALYSIS, COMPLETE
Bilirubin, UA: NEGATIVE
Glucose, UA: NEGATIVE
Ketones, UA: NEGATIVE
Leukocytes,UA: NEGATIVE
Nitrite, UA: NEGATIVE
Protein,UA: NEGATIVE
Specific Gravity, UA: >1.03 — ABNORMAL HIGH (ref 1.005–1.030)
Urobilinogen, Ur: 0.2 mg/dL (ref 0.2–1.0)
pH, UA: 5 (ref 5.0–7.5)

## 2021-01-23 LAB — WET PREP FOR TRICH, YEAST, CLUE
Clue Cell Exam: NEGATIVE
Trichomonas Exam: NEGATIVE
Yeast Exam: NEGATIVE

## 2021-01-23 MED ORDER — MELOXICAM 7.5 MG PO TABS: 7.5000 mg | ORAL_TABLET | Freq: Every day | ORAL | 2 refills | Status: DC

## 2021-01-23 MED ORDER — ROSUVASTATIN CALCIUM 10 MG PO TABS: 10.0000 mg | ORAL_TABLET | Freq: Every day | ORAL | 1 refills | Status: DC

## 2021-01-23 MED ORDER — FLUCONAZOLE 150 MG PO TABS: 150.0000 mg | ORAL_TABLET | Freq: Once | ORAL | 0 refills | Status: AC

## 2021-01-23 NOTE — Progress Notes (Signed)
Assessment & Plan:  1. Mixed hyperlipidemia - Well controlled on current regimen. Patient declined lab work today but is agreeable to do it at her next visit. - rosuvastatin (CRESTOR) 10 MG tablet; Take 1 tablet (10 mg total) by mouth daily.  Dispense: 90 tablet; Refill: 1  2. Aortic atherosclerosis (HCC) - Continue statin therapy. - rosuvastatin (CRESTOR) 10 MG tablet; Take 1 tablet (10 mg total) by mouth daily.  Dispense: 90 tablet; Refill: 1  3. Current smoker - Encouraged smoking cessation.  4. Chronic obstructive pulmonary disease, unspecified COPD type (HCC) - Well controlled on current regimen.  Managed by pulmonology.  5. Recurrent major depressive disorder, in full remission (HCC) - Controlled without medication.  6. Generalized anxiety disorder - Controlled without medication.  7. Arthritis - Rx'd meloxicam. - meloxicam (MOBIC) 7.5 MG tablet; Take 1 tablet (7.5 mg total) by mouth daily.  Dispense: 30 tablet; Refill: 2  8. Vaginal discharge - Wet prep negative. - WET PREP FOR TRICH, YEAST, CLUE  9. Oral thrush - fluconazole (DIFLUCAN) 150 MG tablet; Take 1 tablet (150 mg total) by mouth once for 1 dose. May repeat after 3 days if needed.  Dispense: 2 tablet; Refill: 0  10. Cloudy urine - UA does not look like she has a UTI.  Encouraged her to increase her water intake. - Urine Culture - Urinalysis, Complete   Return in about 4 months (around 05/23/2021) for follow-up of chronic medication conditions.  [name], MSN, APRN, FNP-C [facility]  Subjective:    Patient ID: Jeanne Becker, female    DOB: 1956-02-17, 65 y.o.   MRN: [id number]  Patient Care Team: Gwenlyn Fudge, FNP as PCP - General (Family Medicine) Leslye Peer, MD as Consulting Physician (Pulmonary Disease)   Chief Complaint:  Chief Complaint  Patient presents with  . Anxiety    3 month follow up     HPI: Jeanne Becker is a 65 y.o. female presenting  on 01/23/2021 for Anxiety (3 month follow up/)  Hyperlipidemia: patient is tolerating rosuvastatin daily. She did have a lung cancer screening CT which showed aortic atherosclerosis and advanced coronary artery atherosclerosis. She does continue to smoke.  COPD: Managed by her pulmonologist.  New complaints: Patient reports joint pain in multiple joints - hands, wrists, elbows, and knees. She does not take any medication to treat.  Patient is also concerned about a yeast infection. She reports her urine is bubbly and cloudy and she has a vaginal discharge. She also feels she has thrush in her mouth. She does use a Trelegy inhaler but states she rinses her mouth well after each use.  Social history:  Relevant past medical, surgical, family and social history reviewed and updated as indicated. Interim medical history since our last visit reviewed.  Allergies and medications reviewed and updated.  DATA REVIEWED: CHART IN EPIC  ROS: Negative unless specifically indicated above in HPI.    Current Outpatient Medications:  .  albuterol (PROVENTIL) (2.5 MG/3ML) 0.083% nebulizer solution, Take 3 mLs (2.5 mg total) by nebulization every 4 (four) hours as needed for wheezing or shortness of breath., Disp: 75 mL, Rfl: 3 .  albuterol (VENTOLIN HFA) 108 (90 Base) MCG/ACT inhaler, INHALE 2 PUFFS INTO THE LUNGS EVERY 4 (FOUR) HOURS AS NEEDED FOR WHEEZING OR SHORTNESS OF BREATH., Disp: 18 g, Rfl: 4 .  fluticasone (FLONASE) 50 MCG/ACT nasal spray, SPRAY 2 SPRAYS INTO EACH NOSTRIL EVERY DAY, Disp: 48 mL, Rfl: 0 .  Fluticasone-Umeclidin-Vilant (TRELEGY ELLIPTA) 100-62.5-25 MCG/INH AEPB, Inhale 1 Inhaler into the lungs daily., Disp: 1 each, Rfl: 11 .  rosuvastatin (CRESTOR) 10 MG tablet, TAKE 1 TABLET BY MOUTH EVERY DAY, Disp: 90 tablet, Rfl: 0   Allergies  Allergen Reactions  . Ivp Dye [Iodinated Diagnostic Agents]   . Lexapro [Escitalopram] Nausea And Vomiting  . Other   . Penicillins   . Codeine Rash    Past Medical History:  Diagnosis Date  . Allergic rhinitis, cause unspecified   . Anxiety state, unspecified   . Chronic airway obstruction, not elsewhere classified   . Depressive disorder, not elsewhere classified   . Essential hypertension, benign   . Fatigue   . H/O diabetes mellitus   . Hyperlipidemia   . Lower back pain   . Menopause   . Panic disorder without agoraphobia   . Seborrheic keratosis   . Unspecified arthropathy, shoulder region     Past Surgical History:  Procedure Laterality Date  . APPENDECTOMY    . BREAST BIOPSY  12/2012   benign  . CESAREAN SECTION    . TONSILLECTOMY    . TUBAL LIGATION      Social History   Socioeconomic History  . Marital status: Single    Spouse name: Not on file  . Number of children: Not on file  . Years of education: Not on file  . Highest education level: Not on file  Occupational History  . Not on file  Tobacco Use  . Smoking status: Current Every Day Smoker    Packs/day: 1.50    Years: 30.00    Pack years: 45.00    Types: Cigarettes  . Smokeless tobacco: Never Used  . Tobacco comment: 20 cigarettes daily 09/22/20 [facility]   Vaping Use  . Vaping Use: Never used  Substance and Sexual Activity  . Alcohol use: Not Currently    Alcohol/week: 12.0 standard drinks    Types: 12 Cans of beer per week    Comment: 2 drinks/day or fewer  . Drug use: Not Currently    Types: Marijuana  . Sexual activity: Yes    Partners: Male    Birth control/protection: Surgical  Other Topics Concern  . Not on file  Social History Narrative   Originally from [place]. Always lived in [place]. 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 She has a dog currently. Remote exposure to love birds. No mold exposure.    Social Determinants of Health   Financial Resource Strain: Low Risk   . Difficulty of Paying Living Expenses: Not very hard  Food Insecurity: No Food Insecurity  . Worried About Patent examiner in the Last Year: Never true  . Ran Out of Food in the Last Year: Never true  Transportation Needs: No Transportation Needs  . Lack of Transportation (Medical): No  . Lack of Transportation (Non-Medical): No  Physical Activity: Inactive  . Days of Exercise per Week: 0 days  . Minutes of Exercise per Session: 0 min  Stress: Stress Concern Present  . Feeling of Stress : Rather much  Social Connections: Socially Isolated  . Frequency of Communication with Friends and Family: Once a week  . Frequency of Social Gatherings with Friends and Family: Never  . Attends Religious Services: Never  . Active Member of Clubs or Organizations: No  . Attends Banker Meetings: Never  . Marital Status: Married  Catering manager Violence: Not At Risk  .  Fear of Current or Ex-Partner: No  . Emotionally Abused: No  . Physically Abused: No  . Sexually Abused: No        Objective:    BP 140/82   Pulse (!) 111   Temp 97.8 F (36.6 C) (Temporal)   Ht 5' 1.5" (1.562 m)   Wt 98 lb 3.2 oz (44.5 kg)   SpO2 99%   BMI 18.25 kg/m   Wt Readings from Last 3 Encounters:  01/23/21 98 lb 3.2 oz (44.5 kg)  09/22/20 91 lb 12.8 oz (41.6 kg)  09/11/20 91 lb 9.6 oz (41.5 kg)    Physical Exam Vitals reviewed.  Constitutional:      General: She is not in acute distress.    Appearance: Normal appearance. She is underweight. She is not ill-appearing, toxic-appearing or diaphoretic.  HENT:     Head: Normocephalic and atraumatic.     Mouth/Throat:     Mouth: Mucous membranes are moist. No injury, lacerations, oral lesions or angioedema.     Tongue: No lesions.     Palate: No lesions.     Pharynx: Oropharynx is clear.     Tonsils: No tonsillar exudate or tonsillar abscesses.     Comments: White tongue. Eyes:     General: No scleral icterus.       Right eye: No discharge.        Left eye: No discharge.     Conjunctiva/sclera: Conjunctivae normal.  Cardiovascular:     Rate and Rhythm:  Normal rate and regular rhythm.     Heart sounds: Normal heart sounds. No murmur heard. No friction rub. No gallop.   Pulmonary:     Effort: Pulmonary effort is normal. No respiratory distress.     Breath sounds: Normal breath sounds. No stridor. No wheezing, rhonchi or rales.  Musculoskeletal:        General: Normal range of motion.     Cervical back: Normal range of motion.  Skin:    General: Skin is warm and dry.     Capillary Refill: Capillary refill takes less than 2 seconds.  Neurological:     General: No focal deficit present.     Mental Status: She is alert and oriented to person, place, and time. Mental status is at baseline.  Psychiatric:        Mood and Affect: Mood normal.        Behavior: Behavior normal.        Thought Content: Thought content normal.        Judgment: Judgment normal.     No results found for: TSH Lab Results  Component Value Date   WBC 6.9 03/19/2020   HGB 15.2 03/19/2020   HCT 44.0 03/19/2020   MCV 104 (H) 03/19/2020   PLT 195 03/19/2020   Lab Results  Component Value Date   NA 141 03/19/2020   K 3.7 03/19/2020   CO2 24 03/19/2020   GLUCOSE 89 03/19/2020   BUN 8 03/19/2020   CREATININE 0.84 03/19/2020   BILITOT 0.4 03/19/2020   ALKPHOS 55 03/19/2020   AST 23 03/19/2020   ALT 11 03/19/2020   PROT 6.8 03/19/2020   ALBUMIN 4.6 03/19/2020   CALCIUM 9.8 03/19/2020   Lab Results  Component Value Date   CHOL 167 03/19/2020   Lab Results  Component Value Date   HDL 71 03/19/2020   Lab Results  Component Value Date   LDLCALC 75 03/19/2020   Lab Results  Component Value Date  TRIG 120 03/19/2020   Lab Results  Component Value Date   CHOLHDL 2.4 03/19/2020   Lab Results  Component Value Date   HGBA1C 5.3 03/19/2020

## 2021-01-26 LAB — URINE CULTURE

## 2021-02-02 ENCOUNTER — Other Ambulatory Visit: Payer: Self-pay | Admitting: Emergency Medicine

## 2021-02-03 ENCOUNTER — Telehealth: Payer: Self-pay

## 2021-02-03 ENCOUNTER — Telehealth: Payer: Self-pay | Admitting: Emergency Medicine

## 2021-02-03 NOTE — Telephone Encounter (Signed)
Called and spoke with pt letting her know the info stated by RB and she verbalized understanding. Pt stated that the mucus is not discolored. Stated to her if she does start having discolored mucus to call us to let us know and we could then send Rx for abx to pharmacy for her. Nothing further needed.

## 2021-02-03 NOTE — Telephone Encounter (Signed)
  Prescription Request  02/03/2021  What is the name of the medication or equipment? Pt had televisit with britney and she was told she had sinus infection. She is not any better and she wants zpak called into pharmacy  Have you contacted your pharmacy to request a refill? (if applicable) no  Which pharmacy would you [facility] this sent to? [facility]   Patient notified that their request is being sent to the clinical staff for review and that they should receive a response within 2 business days.

## 2021-02-03 NOTE — Telephone Encounter (Signed)
Called and spoke with patient who states that she been blowing blood out of her nose and has a cough that started last week. Cough with clear sputum. States that she had been having sinus pressure in between her eyes and headache. Patient has made an appointment for next month. **if patient does not answer please leave message per patient**  Dr. Delton Coombes please advise

## 2021-02-03 NOTE — Telephone Encounter (Signed)
She should try using OTC decongestant like Tylenol Cold and flu  Is she having any colored mucous from her sinuses (besides the blood)? If so then we could treat her for an acute sinusitis w antibiotics. Would give her augmentin 875mg  bid x 7 days.

## 2021-02-03 NOTE — Telephone Encounter (Signed)
Bloody nasal drainage  Ears stopped up  Coughing some but infrequent.   Finished med for thrush.   Pt is using Flonase and saline.  No fever. Infrequent HA between eyes.  Britney,   Are you ok sending in medication or do you want pt to have another appt. She was seen 01/23/21.

## 2021-02-03 NOTE — Telephone Encounter (Signed)
Telephone appt scheduled for 02/04/21 at 3:50

## 2021-02-03 NOTE — Telephone Encounter (Signed)
She has not had a visit for respiratory symptoms in over a month. Please have her schedule appointment.

## 2021-02-03 NOTE — Telephone Encounter (Signed)
Thanks

## 2021-02-04 ENCOUNTER — Ambulatory Visit: Payer: Medicare Other | Admitting: Family Medicine

## 2021-02-27 ENCOUNTER — Other Ambulatory Visit: Payer: Self-pay

## 2021-02-27 ENCOUNTER — Ambulatory Visit: Payer: Medicare Other | Admitting: Emergency Medicine

## 2021-02-27 ENCOUNTER — Encounter: Payer: Self-pay | Admitting: Emergency Medicine

## 2021-02-27 DIAGNOSIS — F17200 Nicotine dependence, unspecified, uncomplicated: Secondary | ICD-10-CM

## 2021-02-27 DIAGNOSIS — J449 Chronic obstructive pulmonary disease, unspecified: Secondary | ICD-10-CM

## 2021-02-27 DIAGNOSIS — J309 Allergic rhinitis, unspecified: Secondary | ICD-10-CM

## 2021-02-27 MED ORDER — TRELEGY ELLIPTA 100-62.5-25 MCG/INH IN AEPB: 1.0000 | INHALATION_SPRAY | Freq: Every day | RESPIRATORY_TRACT | 11 refills | Status: DC

## 2021-02-27 NOTE — Patient Instructions (Signed)
Please restart your Trelegy 1 inhalation every day.  Rinse and gargle after you use it. Keep your albuterol available use either 2 puffs or 1 nebulizer treatment up to every 4 hours if needed for shortness of breath, chest tightness, wheezing. Use your fluticasone nasal spray, 2 sprays each nostril daily if you need it for increased nasal congestion. Get your COVID-19 booster shot in April Continue to work on decreasing your smoking.  A good goal would be to get down to 3/4 pack/day by our next visit Follow with Dr Delton Coombes in 6 months or sooner if you have any problems

## 2021-02-27 NOTE — Assessment & Plan Note (Signed)
She deals with alternating rhinitis and dryness.  She uses the fluticasone nasal spray intermittently.  Uses nasal saline for the dryness.  She never required antibiotics for sinusitis last month.  Plan to continue same

## 2021-02-27 NOTE — Addendum Note (Signed)
Addended by: Wyvonne Lenz on: 02/27/2021 09:25 AM   Modules accepted: Orders

## 2021-02-27 NOTE — Assessment & Plan Note (Signed)
She is having more chest tightness and albuterol use.  She is not taking her Trelegy every day.  We talked about getting back on this reliably.  She is good to work on that.  There is a cost issue as it is about $142 monthly.  We may need to adjust going forward.  She did get the COVID vaccine as we had discussed is due for the booster in April  Please restart your Trelegy 1 inhalation every day.  Rinse and gargle after you use it. Keep your albuterol available use either 2 puffs or 1 nebulizer treatment up to every 4 hours if needed for shortness of breath, chest tightness, wheezing. Use your fluticasone nasal spray, 2 sprays each nostril daily if you need it for increased nasal congestion. Get your COVID-19 booster shot in April Follow with Dr Delton Coombes in 6 months or sooner if you have any problems

## 2021-02-27 NOTE — Assessment & Plan Note (Signed)
Discussed cessation with her today.  Her goal is to cut down.  We will try to get down to 3/4 packs/day by next visit

## 2021-02-27 NOTE — Progress Notes (Signed)
Subjective:    Patient ID: Jeanne Becker, female    DOB: [birth year], 65 y.o.   MRN: [id number]  HPI  ROV 09/21/2019 --65 year old woman, smoker with moderately severe obstruction and a positive bronchodilator response.  She is on Trelegy for her COPD.  She has albuterol uses approximately 3x a week.  Also with allergic rhinitis, significant sinus congestion.  She participates in the lung cancer screening program, most recent scan was 10/18/2018, she is due for a repeat scan this October.  Her pneumonia vaccine is up-to-date until after age 15. She reports that she is not getting out much, hasn't really challenged her breathing w exertion. No flares since last time. She she is tolerating Trelegy. She has had a lot of difficulty with sinus congestion and allergies. She has used flonase. She has a neti-pot, hasn't used it yet. Not on an anti-histamine.   ROV 09/22/20 --65 year old woman, heavy smoker with moderately severe obstruction and COPD, positive bronchodilator response, allergic rhinitis.  She has been managed with Trelegy and albuterol which she uses approximately 2-3x a day. She was treated for an acute flare in 12/2019, then received abx again from urgent care. She was treated for thrush, yeast vaginitis recently by her PCP.  It looks like on a test claim both Trelegy and Breztri are $142. She describes some sternal pain. Has some cough with white phlegm, mild sinus clear drainage. She is on flonase, got it OTC, needs a script. Smoking 1 pk a day  She will need one of the pneumonia vaccines after age 30. She hasn't had the COVID vaccine.   ROV 02/27/21 --follow-up visit 65 year old woman with moderate to severe COPD.  She continues to smoke.  She has positive bronchodilator response, also allergic rhinitis.  She was having flaring rhinitis symptoms about 1 month ago including small amount of epistaxis.  Managed on Trelegy - but she has not been using reliably, not every day. Uses albuterol about  2-3x day.  Today she reports that she has been doing a bit better since she used decongestant for a week. Has not been on flonase, uses saline nasal spray. Sometimes has nasal dryness. She has some tightness in her chest, happens w activity outside. She likes to work in garden.  She has gotten her COVID shots x 2.  She is smoking a pack/day.  No flares or abx since last time.   Review of Systems As per HPI     Objective:   Physical Exam Vitals:   02/27/21 0857  BP: 128/70  Pulse: 99  Temp: 97.6 F (36.4 C)  TempSrc: Temporal  SpO2: 97%  Weight: 98 lb 3.2 oz (44.5 kg)  Height: 5' 1.5" (1.562 m)   Gen: Pleasant, thin woman, in no distress,  normal affect  ENT: No lesions,  mouth clear, some white coating on posterior tongue, no postnasal drip  Neck: No JVD, no stridor  Lungs: No use of accessory muscle,  clear without rales or rhonchi  Cardiovascular: RRR, heart sounds normal, no murmur or gallops, no peripheral edema  Musculoskeletal: No deformities, no cyanosis or clubbing  Neuro: alert, non focal  Skin: Warm, no lesions or rashes      Assessment & Plan:  COPD, moderately severe She is having more chest tightness and albuterol use.  She is not taking her Trelegy every day.  We talked about getting back on this reliably.  She is good to work on that.  There is a cost issue as it  is about $142 monthly.  We may need to adjust going forward.  She did get the COVID vaccine as we had discussed is due for the booster in April  Please restart your Trelegy 1 inhalation every day.  Rinse and gargle after you use it. Keep your albuterol available use either 2 puffs or 1 nebulizer treatment up to every 4 hours if needed for shortness of breath, chest tightness, wheezing. Use your fluticasone nasal spray, 2 sprays each nostril daily if you need it for increased nasal congestion. Get your COVID-19 booster shot in April Follow with Dr Delton Coombes in 6 months or sooner if you have any  problems  Chronic allergic rhinitis She deals with alternating rhinitis and dryness.  She uses the fluticasone nasal spray intermittently.  Uses nasal saline for the dryness.  She never required antibiotics for sinusitis last month.  Plan to continue same  Current smoker Discussed cessation with her today.  Her goal is to cut down.  We will try to get down to 3/4 packs/day by next visit  Levy Pupa, MD, PhD 02/27/2021, 9:21 AM [facility] [phone] or if no answer [phone]

## 2021-03-30 ENCOUNTER — Other Ambulatory Visit: Payer: Self-pay | Admitting: Family

## 2021-03-30 ENCOUNTER — Telehealth: Payer: Self-pay | Admitting: Emergency Medicine

## 2021-03-30 DIAGNOSIS — J441 Chronic obstructive pulmonary disease with (acute) exacerbation: Secondary | ICD-10-CM

## 2021-03-30 NOTE — Telephone Encounter (Signed)
I called and spoke with pt and she is aware of recs from RB.  Nothing further is needed.

## 2021-03-30 NOTE — Telephone Encounter (Signed)
I would have her try OTC dayquil / nyquil as directed She needs to contin the flonase. Would probably benefit from starting loratadine during the pollen season  It doesn't sound like we need an antibiotic right now. If she has fever, colored mucous, wheeze / SOB then we would need to consider it

## 2021-03-30 NOTE — Telephone Encounter (Signed)
Primary Pulmonologist: Dr. Delton Coombes Last office visit and with whom: 02/27/21 Dr Delton Coombes What do we see them for (pulmonary problems): COPD Last OV assessment/plan:  Instructions  Please restart your Trelegy 1 inhalation every day.  Rinse and gargle after you use it. Keep your albuterol available use either 2 puffs or 1 nebulizer treatment up to every 4 hours if needed for shortness of breath, chest tightness, wheezing. Use your fluticasone nasal spray, 2 sprays each nostril daily if you need it for increased nasal congestion. Get your COVID-19 booster shot in April Continue to work on decreasing your smoking.  A good goal would be to get down to 3/4 pack/day by our next visit Follow with Dr Delton Coombes in 6 months or sooner if you have any problems          Reason for call:  Called and spoke with Patient. Patient stated she is having a productive cough, clear sputum, head congestion, runny nose at times, and ear pain. Patient stated she feels it may be seasonal allergy pollen related. Patient is unsure if she needs a antibiotic or something OTC?  Patient would like OTC recommendations to help with symptoms. Patient request cough medication prescription or OTC. Patient has tried Tylenol sinus, with no relief, and stated she could not take Benadryl.   Message routed to Dr. Delton Coombes    Allergies  Allergen Reactions  . Ivp Dye [Iodinated Diagnostic Agents]   . Lexapro [Escitalopram] Nausea And Vomiting  . Other   . Penicillins   . Codeine Rash    Immunization History  Administered Date(s) Administered  . DTaP 01/28/2014  . Influenza Whole 10/07/2012  . Influenza, High Dose Seasonal PF 11/11/2016, 09/15/2017  . Influenza,inj,Quad PF,6+ Mos 11/06/2012, 08/31/2013, 01/08/2015, 02/19/2016, 09/14/2018, 09/21/2019  . Influenza-Unspecified 11/06/2012, 02/19/2016, 11/11/2016, 09/15/2017  . Moderna Sars-Covid-2 Vaccination 09/22/2020, 10/20/2020  . Pneumococcal Conjugate-13 07/17/2014  .  Pneumococcal Polysaccharide-23 08/31/2013  . Tdap 01/29/2014

## 2021-03-30 NOTE — Telephone Encounter (Signed)
Pt is calling bc they have not been feeling well since the last two days. Pt has been experiencing the symptoms of; cough and congestion and stated that she believes the pollen has been pretty bad in [facility]. Pt states that sometimes she coughs up phlegm and states it is no color too it. Pt is wanting for Dr. Delton Coombes to provide her with an antibiotic and some advice on any type of over the counter cough medicine she should take to help with symptoms. Pls regard; [phone]; Pharmacy; [phone] - [place], [place] - [address]

## 2021-04-16 DIAGNOSIS — H6982 Other specified disorders of Eustachian tube, left ear: Secondary | ICD-10-CM | POA: Diagnosis not present

## 2021-04-16 DIAGNOSIS — J329 Chronic sinusitis, unspecified: Secondary | ICD-10-CM | POA: Diagnosis not present

## 2021-05-15 ENCOUNTER — Other Ambulatory Visit: Payer: Self-pay | Admitting: Family

## 2021-05-15 ENCOUNTER — Encounter: Payer: Self-pay | Admitting: Family Medicine

## 2021-05-15 ENCOUNTER — Other Ambulatory Visit: Payer: Self-pay

## 2021-05-15 ENCOUNTER — Ambulatory Visit (INDEPENDENT_AMBULATORY_CARE_PROVIDER_SITE_OTHER): Payer: Medicare Other | Admitting: Family Medicine

## 2021-05-15 VITALS — BP 127/71 | HR 119 | Temp 97.9°F | Ht 61.5 in | Wt 97.0 lb

## 2021-05-15 DIAGNOSIS — M1990 Unspecified osteoarthritis, unspecified site: Secondary | ICD-10-CM

## 2021-05-15 DIAGNOSIS — I700 Atherosclerosis of aorta: Secondary | ICD-10-CM | POA: Diagnosis not present

## 2021-05-15 DIAGNOSIS — F17200 Nicotine dependence, unspecified, uncomplicated: Secondary | ICD-10-CM | POA: Diagnosis not present

## 2021-05-15 DIAGNOSIS — Z0000 Encounter for general adult medical examination without abnormal findings: Secondary | ICD-10-CM | POA: Diagnosis not present

## 2021-05-15 DIAGNOSIS — E782 Mixed hyperlipidemia: Secondary | ICD-10-CM | POA: Diagnosis not present

## 2021-05-15 DIAGNOSIS — J441 Chronic obstructive pulmonary disease with (acute) exacerbation: Secondary | ICD-10-CM

## 2021-05-15 DIAGNOSIS — Z0001 Encounter for general adult medical examination with abnormal findings: Secondary | ICD-10-CM | POA: Diagnosis not present

## 2021-05-15 DIAGNOSIS — J449 Chronic obstructive pulmonary disease, unspecified: Secondary | ICD-10-CM

## 2021-05-15 MED ORDER — BUPROPION HCL ER (SR) 150 MG PO TB12: 150.0000 mg | ORAL_TABLET | Freq: Two times a day (BID) | ORAL | 2 refills | Status: DC

## 2021-05-15 MED ORDER — ASPIRIN 81 MG PO TBEC: 81.0000 mg | DELAYED_RELEASE_TABLET | Freq: Every day | ORAL | 12 refills | Status: AC

## 2021-05-15 MED ORDER — CELECOXIB 100 MG PO CAPS: 100.0000 mg | ORAL_CAPSULE | Freq: Two times a day (BID) | ORAL | 2 refills | Status: DC

## 2021-05-15 NOTE — Patient Instructions (Addendum)
Start Wellbutrin once daily x3 days, then increase to twice daily.   Preventive Care 65 Years and Older, Female Preventive care refers to lifestyle choices and visits with your health care provider that can promote health and wellness. This includes:  A yearly physical exam. This is also called an annual wellness visit.  Regular dental and eye exams.  Immunizations.  Screening for certain conditions.  Healthy lifestyle choices, such as: ? Eating a healthy diet. ? Getting regular exercise. ? Not using drugs or products that contain nicotine and tobacco. ? Limiting alcohol use. What can I expect for my preventive care visit? Physical exam Your health care provider will check your:  Height and weight. These may be used to calculate your BMI (body mass index). BMI is a measurement that tells if you are at a healthy weight.  Heart rate and blood pressure.  Body temperature.  Skin for abnormal spots. Counseling Your health care provider may ask you questions about your:  Past medical problems.  Family's medical history.  Alcohol, tobacco, and drug use.  Emotional well-being.  Home life and relationship well-being.  Sexual activity.  Diet, exercise, and sleep habits.  History of falls.  Memory and ability to understand (cognition).  Work and work Statistician.  Pregnancy and menstrual history.  Access to firearms. What immunizations do I need? Vaccines are usually given at various ages, according to a schedule. Your health care provider will recommend vaccines for you based on your age, medical history, and lifestyle or other factors, such as travel or where you work.   What tests do I need? Blood tests  Lipid and cholesterol levels. These may be checked every 5 years, or more often depending on your overall health.  Hepatitis C test.  Hepatitis B test. Screening  Lung cancer screening. You may have this screening every year starting at age 655 if you have a  30-pack-year history of smoking and currently smoke or have quit within the past 15 years.  Colorectal cancer screening. ? All adults should have this screening starting at age 35 and continuing until age 4. ? Your health care provider may recommend screening at age 65 if you are at increased risk. ? You will have tests every 1-10 years, depending on your results and the type of screening test.  Diabetes screening. ? This is done by checking your blood sugar (glucose) after you have not eaten for a while (fasting). ? You may have this done every 1-3 years.  Mammogram. ? This may be done every 1-2 years. ? Talk with your health care provider about how often you should have regular mammograms.  Abdominal aortic aneurysm (AAA) screening. You may need this if you are a current or former smoker.  BRCA-related cancer screening. This may be done if you have a family history of breast, ovarian, tubal, or peritoneal cancers. Other tests  STD (sexually transmitted disease) testing, if you are at risk.  Bone density scan. This is done to screen for osteoporosis. You may have this done starting at age 65. Talk with your health care provider about your test results, treatment options, and if necessary, the need for more tests. Follow these instructions at home: Eating and drinking  Eat a diet that includes fresh fruits and vegetables, whole grains, lean protein, and low-fat dairy products. Limit your intake of foods with high amounts of sugar, saturated fats, and salt.  Take vitamin and mineral supplements as recommended by your health care provider.  Do not  drink alcohol if your health care provider tells you not to drink.  If you drink alcohol: ? Limit how much you have to 0-1 drink a day. ? Be aware of how much alcohol is in your drink. In the [place] one drink equals one 12 oz bottle of beer (355 mL), one 5 oz glass of wine (148 mL), or one 1 oz glass of hard liquor (44 mL).    Lifestyle  Take daily care of your teeth and gums. Brush your teeth every morning and night with fluoride toothpaste. Floss one time each day.  Stay active. Exercise for at least 30 minutes 5 or more days each week.  Do not use any products that contain nicotine or tobacco, such as cigarettes, e-cigarettes, and chewing tobacco. If you need help quitting, ask your health care provider.  Do not use drugs.  If you are sexually active, practice safe sex. Use a condom or other form of protection in order to prevent STIs (sexually transmitted infections).  Talk with your health care provider about taking a low-dose aspirin or statin.  Find healthy ways to cope with stress, such as: ? Meditation, yoga, or listening to music. ? Journaling. ? Talking to a trusted person. ? Spending time with friends and family. Safety  Always wear your seat belt while driving or riding in a vehicle.  Do not drive: ? If you have been drinking alcohol. Do not ride with someone who has been drinking. ? When you are tired or distracted. ? While texting.  Wear a helmet and other protective equipment during sports activities.  If you have firearms in your house, make sure you follow all gun safety procedures. What's next?  Visit your health care provider once a year for an annual wellness visit.  Ask your health care provider how often you should have your eyes and teeth checked.  Stay up to date on all vaccines. This information is not intended to replace advice given to you by your health care provider. Make sure you discuss any questions you have with your health care provider. Document Revised: 12/03/2020 Document Reviewed: 12/07/2018 Elsevier Patient Education  2021 [employer].

## 2021-05-15 NOTE — Progress Notes (Signed)
Assessment & Plan:  1. Well adult exam Preventive health education provided. Patient declined pneumonia vaccine, DEXA, hepatitis C and HIV screening. - CBC with Differential/Platelet - CMP14+EGFR - Lipid panel  2. Aortic atherosclerosis ([place]) Advised to resume ASA. On statin. - aspirin 81 MG EC tablet; Take 1 tablet (81 mg total) by mouth daily. Swallow whole.  Dispense: 30 tablet; Refill: 12  3. Mixed hyperlipidemia Labs to assess. - Lipid panel  4. Current smoker Wants to quit smoking. Currently smoking 1.5 packs/day. Started Wellbutrin with directions to start once daily x3 days, then increase to twice daily.  - buPROPion (WELLBUTRIN SR) 150 MG 12 hr tablet; Take 1 tablet (150 mg total) by mouth 2 (two) times daily.  Dispense: 60 tablet; Refill: 2  5. Chronic obstructive pulmonary disease, unspecified COPD type ([place]) Managed by pulmonology.   6. Arthritis Uncontrolled. D/C'd meloxicam. Started Celebrex.  - CMP14+EGFR - celecoxib (CELEBREX) 100 MG capsule; Take 1 capsule (100 mg total) by mouth 2 (two) times daily.  Dispense: 60 capsule; Refill: 2   Follow-up: Return in about 6 weeks (around 06/26/2021) for pain, smoking.   Jeanne Limes, MSN, APRN, FNP-C [facility]  Subjective:  Patient ID: Jeanne Becker, female    DOB: March 23, 1956  Age: 65 y.o. MRN: [id number]  Patient Care Team: Jeanne Brooklyn, FNP as PCP - General (Family Medicine) Jeanne Gobble, MD as Consulting Physician (Pulmonary Disease)   CC:  Chief Complaint  Patient presents with  . Annual Exam  . Ear Pain    Patient was seen at [facility] on 4/21 for left ear pain and states she still has ear pain    HPI Jeanne Becker presents for her annual physical.   Occupation: Disabled, Marital status: Married, Substance use: Marijuana Diet: Regular, Exercise: Yard work Last eye exam: Last year Last dental exam: 2 months ago Last colonoscopy: Cologuard negative  04/10/2020 Last mammogram: 04/29/2020 Last pap smear: 07/30/2020 with repeat in 5 years DEXA: Declined Hepatitis C Screening: Declined Immunizations: Flu Vaccine: not flu season Tdap Vaccine: up to date  COVID-19 Vaccine: Needs booster Pneumonia Vaccine: declined   Patient reports joint pain is not improved with Meloxicam. States when she was younger she took something that started with a "C" that was helpful.   DEPRESSION SCREENING PHQ 2/9 Scores 05/15/2021 01/23/2021 09/11/2020 08/02/2020 06/17/2020 04/30/2020 04/03/2020  PHQ - 2 Score 0 0 0 1 0 1 2  PHQ- 9 Score _0 Review of Systems  Constitutional: Negative for chills, fever, malaise/fatigue and weight loss.  HENT: Negative for congestion, ear discharge, ear pain, nosebleeds, sinus pain, sore throat and tinnitus.   Eyes: Negative for blurred vision, double vision, pain, discharge and redness.  Respiratory: Positive for shortness of breath. Negative for cough and wheezing.   Cardiovascular: Negative for chest pain, palpitations and leg swelling.  Gastrointestinal: Negative for abdominal pain, constipation, diarrhea, heartburn, nausea and vomiting.  Genitourinary: Negative for dysuria, frequency and urgency.  Musculoskeletal: Negative for myalgias.  Skin: Negative for rash.  Neurological: Negative for dizziness, seizures, weakness and headaches.  Psychiatric/Behavioral: Negative for depression, substance abuse and suicidal ideas. The patient is not nervous/anxious.      Current Outpatient Medications:  .  acetaminophen (TYLENOL) 325 MG tablet, Take 650 mg by mouth every 6 (six) hours as needed., Disp: , Rfl:  .  albuterol (PROVENTIL) (2.5 MG/3ML) 0.083% nebulizer solution, Take 3 mLs (2.5 mg  total) by nebulization every 4 (four) hours as needed for wheezing or shortness of breath., Disp: 75 mL, Rfl: 3 .  albuterol (VENTOLIN HFA) 108 (90 Base) MCG/ACT inhaler, INHALE 2 PUFFS INTO THE LUNGS EVERY 4 (FOUR) HOURS AS NEEDED FOR  WHEEZING OR SHORTNESS OF BREATH., Disp: 8.5 each, Rfl: 4 .  fluticasone (FLONASE) 50 MCG/ACT nasal spray, SPRAY 2 SPRAYS INTO EACH NOSTRIL EVERY DAY, Disp: 48 mL, Rfl: 0 .  Fluticasone-Umeclidin-Vilant (TRELEGY ELLIPTA) 100-62.5-25 MCG/INH AEPB, Inhale 1 Inhaler into the lungs daily., Disp: 1 each, Rfl: 11 .  meloxicam (MOBIC) 7.5 MG tablet, Take 1 tablet (7.5 mg total) by mouth daily., Disp: 30 tablet, Rfl: 2 .  rosuvastatin (CRESTOR) 10 MG tablet, Take 1 tablet (10 mg total) by mouth daily., Disp: 90 tablet, Rfl: 1  Allergies  Allergen Reactions  . Ivp Dye [Iodinated Diagnostic Agents]   . Lexapro [Escitalopram] Nausea And Vomiting  . Other   . Penicillins   . Codeine Rash    Past Medical History:  Diagnosis Date  . Allergic rhinitis, cause unspecified   . Anxiety state, unspecified   . Chronic airway obstruction, not elsewhere classified   . Depressive disorder, not elsewhere classified   . Essential hypertension, benign   . Fatigue   . H/O diabetes mellitus   . Hyperlipidemia   . Lower back pain   . Menopause   . Panic disorder without agoraphobia   . Seborrheic keratosis   . Unspecified arthropathy, shoulder region     Past Surgical History:  Procedure Laterality Date  . APPENDECTOMY    . BREAST BIOPSY  12/2012   benign  . CESAREAN SECTION    . TONSILLECTOMY    . TUBAL LIGATION      Family History  Problem Relation Age of Onset  . Cancer Other   . Allergies Other   . Diabetes Other   . Heart disease Other   . Hypertension Other   . Migraines Other   . Osteoporosis Other   . Seizures Other   . COPD Mother   . Heart disease Mother   . Asthma Son   . Stroke Father   . Heart attack Brother   . Aneurysm Brother   . Throat cancer Maternal Grandfather   . Diabetes Paternal Grandmother   . Heart attack Paternal Grandmother   . Pneumonia Paternal Grandfather     Social History   Socioeconomic History  . Marital status: Single    Spouse name: Not on file   . Number of children: Not on file  . Years of education: Not on file  . Highest education level: Not on file  Occupational History  . Not on file  Tobacco Use  . Smoking status: Current Every Day Smoker    Packs/day: 1.50    Years: 30.00    Pack years: 45.00    Types: Cigarettes    Start date: 55  . Smokeless tobacco: Never Used  . Tobacco comment: currently smoking 1ppd as of 02/27/21 ep  Vaping Use  . Vaping Use: Never used  Substance and Sexual Activity  . Alcohol use: Not Currently    Alcohol/week: 12.0 standard drinks    Types: 12 Cans of beer per week    Comment: 2 drinks/day or fewer  . Drug use: Not Currently    Types: Marijuana  . Sexual activity: Yes    Partners: Male    Birth control/protection: Surgical  Other Topics Concern  . Not on file  Social History Narrative   Originally from [place]. Always lived in [place]. 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 Also worked in [employer] She has a dog currently. Remote exposure to love birds. No mold exposure.    Social Determinants of Health   Financial Resource Strain: Not on file  Food Insecurity: Not on file  Transportation Needs: Not on file  Physical Activity: Not on file  Stress: Not on file  Social Connections: Not on file  Intimate Partner Violence: Not on file      Objective:    BP 127/71   Pulse (!) 119   Temp 97.9 F (36.6 C) (Temporal)   Ht 5' 1.5" (1.562 m)   Wt 97 lb (44 kg)   SpO2 97%   BMI 18.03 kg/m   Wt Readings from Last 3 Encounters:  05/15/21 97 lb (44 kg)  02/27/21 98 lb 3.2 oz (44.5 kg)  01/23/21 98 lb 3.2 oz (44.5 kg)    Physical Exam Vitals reviewed.  Constitutional:      General: She is not in acute distress.    Appearance: Normal appearance. She is underweight. She is not ill-appearing, toxic-appearing or diaphoretic.  HENT:     Head: Normocephalic and atraumatic.     Right Ear: Tympanic membrane, ear canal and external ear normal. There is no  impacted cerumen.     Left Ear: Tympanic membrane, ear canal and external ear normal. There is no impacted cerumen.     Nose: Nose normal. No congestion or rhinorrhea.     Mouth/Throat:     Mouth: Mucous membranes are moist.     Pharynx: Oropharynx is clear. No oropharyngeal exudate or posterior oropharyngeal erythema.  Eyes:     General: No scleral icterus.       Right eye: No discharge.        Left eye: No discharge.     Conjunctiva/sclera: Conjunctivae normal.     Pupils: Pupils are equal, round, and reactive to light.  Cardiovascular:     Rate and Rhythm: Regular rhythm. Tachycardia present.     Heart sounds: Normal heart sounds. No murmur heard. No friction rub. No gallop.   Pulmonary:     Effort: Pulmonary effort is normal. No respiratory distress.     Breath sounds: No stridor. Rhonchi present. No wheezing or rales.  Abdominal:     General: Abdomen is flat. Bowel sounds are normal. There is no distension.     Palpations: Abdomen is soft. There is no mass.     Tenderness: There is no abdominal tenderness. There is no guarding or rebound.     Hernia: No hernia is present.  Musculoskeletal:        General: Normal range of motion.     Cervical back: Normal range of motion and neck supple. No rigidity. No muscular tenderness.  Lymphadenopathy:     Cervical: No cervical adenopathy.  Skin:    General: Skin is warm and dry.     Capillary Refill: Capillary refill takes less than 2 seconds.  Neurological:     General: No focal deficit present.     Mental Status: She is alert and oriented to person, place, and time. Mental status is at baseline.  Psychiatric:        Mood and Affect: Mood normal.        Behavior: Behavior normal.        Thought Content: Thought content normal.        Judgment:  Judgment normal.     No results found for: TSH Lab Results  Component Value Date   WBC 6.9 03/19/2020   HGB 15.2 03/19/2020   HCT 44.0 03/19/2020   MCV 104 (H) 03/19/2020   PLT 195  03/19/2020   Lab Results  Component Value Date   NA 141 03/19/2020   K 3.7 03/19/2020   CO2 24 03/19/2020   GLUCOSE 89 03/19/2020   BUN 8 03/19/2020   CREATININE 0.84 03/19/2020   BILITOT 0.4 03/19/2020   ALKPHOS 55 03/19/2020   AST 23 03/19/2020   ALT 11 03/19/2020   PROT 6.8 03/19/2020   ALBUMIN 4.6 03/19/2020   CALCIUM 9.8 03/19/2020   Lab Results  Component Value Date   CHOL 167 03/19/2020   Lab Results  Component Value Date   HDL 71 03/19/2020   Lab Results  Component Value Date   LDLCALC 75 03/19/2020   Lab Results  Component Value Date   TRIG 120 03/19/2020   Lab Results  Component Value Date   CHOLHDL 2.4 03/19/2020   Lab Results  Component Value Date   HGBA1C 5.3 03/19/2020

## 2021-05-16 LAB — CBC WITH DIFFERENTIAL/PLATELET
Basophils Absolute: 0 10*3/uL (ref 0.0–0.2)
Basos: 1 %
EOS (ABSOLUTE): 0 10*3/uL (ref 0.0–0.4)
Eos: 1 %
Hematocrit: 43.5 % (ref 34.0–46.6)
Hemoglobin: 15.1 g/dL (ref 11.1–15.9)
Immature Grans (Abs): 0 10*3/uL (ref 0.0–0.1)
Immature Granulocytes: 0 %
Lymphocytes Absolute: 1.1 10*3/uL (ref 0.7–3.1)
Lymphs: 20 %
MCH: 35 pg — ABNORMAL HIGH (ref 26.6–33.0)
MCHC: 34.7 g/dL (ref 31.5–35.7)
MCV: 101 fL — ABNORMAL HIGH (ref 79–97)
Monocytes Absolute: 0.8 10*3/uL (ref 0.1–0.9)
Monocytes: 14 %
Neutrophils Absolute: 3.6 10*3/uL (ref 1.4–7.0)
Neutrophils: 64 %
Platelets: 187 10*3/uL (ref 150–450)
RBC: 4.31 x10E6/uL (ref 3.77–5.28)
RDW: 11.5 % — ABNORMAL LOW (ref 11.7–15.4)
WBC: 5.5 10*3/uL (ref 3.4–10.8)

## 2021-05-16 LAB — CMP14+EGFR
ALT: 15 IU/L (ref 0–32)
AST: 20 IU/L (ref 0–40)
Albumin/Globulin Ratio: 2.6 — ABNORMAL HIGH (ref 1.2–2.2)
Albumin: 4.4 g/dL (ref 3.8–4.8)
Alkaline Phosphatase: 64 IU/L (ref 44–121)
BUN/Creatinine Ratio: 10 — ABNORMAL LOW (ref 12–28)
BUN: 9 mg/dL (ref 8–27)
Bilirubin Total: 0.3 mg/dL (ref 0.0–1.2)
CO2: 24 mmol/L (ref 20–29)
Calcium: 9.1 mg/dL (ref 8.7–10.3)
Chloride: 102 mmol/L (ref 96–106)
Creatinine, Ser: 0.92 mg/dL (ref 0.57–1.00)
Globulin, Total: 1.7 g/dL (ref 1.5–4.5)
Glucose: 116 mg/dL — ABNORMAL HIGH (ref 65–99)
Potassium: 3.8 mmol/L (ref 3.5–5.2)
Sodium: 142 mmol/L (ref 134–144)
Total Protein: 6.1 g/dL (ref 6.0–8.5)
eGFR: 69 mL/min/{1.73_m2} (ref >59)

## 2021-05-16 LAB — LIPID PANEL
Chol/HDL Ratio: 3.1 ratio (ref 0.0–4.4)
Cholesterol, Total: 156 mg/dL (ref 100–199)
HDL: 51 mg/dL (ref >39)
LDL Chol Calc (NIH): 80 mg/dL (ref 0–99)
Triglycerides: 142 mg/dL (ref 0–149)
VLDL Cholesterol Cal: 25 mg/dL (ref 5–40)

## 2021-05-18 ENCOUNTER — Ambulatory Visit (INDEPENDENT_AMBULATORY_CARE_PROVIDER_SITE_OTHER): Payer: Medicare Other

## 2021-05-18 VITALS — Ht 62.0 in | Wt 98.0 lb

## 2021-05-18 DIAGNOSIS — Z0000 Encounter for general adult medical examination without abnormal findings: Secondary | ICD-10-CM

## 2021-05-18 NOTE — Patient Instructions (Signed)
Jeanne Becker , Thank you for taking time to come for your Medicare Wellness Visit. I appreciate your ongoing commitment to your health goals. Please review the following plan we discussed and let me know if I can assist you in the future.   Screening recommendations/referrals: Colonoscopy: Cologuard done 04/10/2020 - Repeat every 3 years Mammogram: Done 05/02/2020 - Repeat annually (ordered today) Bone Density: Due at next visit Recommended yearly ophthalmology/optometry visit for glaucoma screening and checkup Recommended yearly dental visit for hygiene and checkup  Vaccinations: Influenza vaccine: Due Fall 2022 Pneumococcal vaccine: Done 08/31/2013 & 07/17/2014 Tdap vaccine: Done 01/29/2014 - Repeat in 10 years Shingles vaccine: Due Shingrix discussed. Please contact your pharmacy for coverage information.    Covid-19: Done 09/22/2020 & 10/20/2020 - due for booster  Advanced directives: Advance directive discussed with you today. I have provided a copy for you to complete at home and have notarized. Once this is complete please bring a copy in to our office so we can scan it into your chart.  Conditions/risks identified: Aim for 30 minutes of exercise or brisk walking each day, drink 6-8 glasses of water and eat lots of fruits and vegetables.  Next appointment: Follow up in one year for your annual wellness visit    Preventive Care 65 Years and Older, Female Preventive care refers to lifestyle choices and visits with your health care provider that can promote health and wellness. What does preventive care include?  A yearly physical exam. This is also called an annual well check.  Dental exams once or twice a year.  Routine eye exams. Ask your health care provider how often you should have your eyes checked.  Personal lifestyle choices, including:  Daily care of your teeth and gums.  Regular physical activity.  Eating a healthy diet.  Avoiding tobacco and drug use.  Limiting  alcohol use.  Practicing safe sex.  Taking low-dose aspirin every day.  Taking vitamin and mineral supplements as recommended by your health care provider. What happens during an annual well check? The services and screenings done by your health care provider during your annual well check will depend on your age, overall health, lifestyle risk factors, and family history of disease. Counseling  Your health care provider may ask you questions about your:  Alcohol use.  Tobacco use.  Drug use.  Emotional well-being.  Home and relationship well-being.  Sexual activity.  Eating habits.  History of falls.  Memory and ability to understand (cognition).  Work and work Astronomer.  Reproductive health. Screening  You may have the following tests or measurements:  Height, weight, and BMI.  Blood pressure.  Lipid and cholesterol levels. These may be checked every 5 years, or more frequently if you are over 24 years old.  Skin check.  Lung cancer screening. You may have this screening every year starting at age 44 if you have a 30-pack-year history of smoking and currently smoke or have quit within the past 15 years.  Fecal occult blood test (FOBT) of the stool. You may have this test every year starting at age 55.  Flexible sigmoidoscopy or colonoscopy. You may have a sigmoidoscopy every 5 years or a colonoscopy every 10 years starting at age 25.  Hepatitis C blood test.  Hepatitis B blood test.  Sexually transmitted disease (STD) testing.  Diabetes screening. This is done by checking your blood sugar (glucose) after you have not eaten for a while (fasting). You may have this done every 1-3 years.  Bone density scan. This is done to screen for osteoporosis. You may have this done starting at age 67.  Mammogram. This may be done every 1-2 years. Talk to your health care provider about how often you should have regular mammograms. Talk with your health care provider  about your test results, treatment options, and if necessary, the need for more tests. Vaccines  Your health care provider may recommend certain vaccines, such as:  Influenza vaccine. This is recommended every year.  Tetanus, diphtheria, and acellular pertussis (Tdap, Td) vaccine. You may need a Td booster every 10 years.  Zoster vaccine. You may need this after age 82.  Pneumococcal 13-valent conjugate (PCV13) vaccine. One dose is recommended after age 34.  Pneumococcal polysaccharide (PPSV23) vaccine. One dose is recommended after age 74. Talk to your health care provider about which screenings and vaccines you need and how often you need them. This information is not intended to replace advice given to you by your health care provider. Make sure you discuss any questions you have with your health care provider. Document Released: 01/09/2016 Document Revised: 09/01/2016 Document Reviewed: 10/14/2015 Elsevier Interactive Patient Education  2017 [place] Prevention in the Home Falls can cause injuries. They can happen to people of all ages. There are many things you can do to make your home safe and to help prevent falls. What can I do on the outside of my home?  Regularly fix the edges of walkways and driveways and fix any cracks.  Remove anything that might make you trip as you walk through a door, such as a raised step or threshold.  Trim any bushes or trees on the path to your home.  Use bright outdoor lighting.  Clear any walking paths of anything that might make someone trip, such as rocks or tools.  Regularly check to see if handrails are loose or broken. Make sure that both sides of any steps have handrails.  Any raised decks and porches should have guardrails on the edges.  Have any leaves, snow, or ice cleared regularly.  Use sand or salt on walking paths during winter.  Clean up any spills in your garage right away. This includes oil or grease  spills. What can I do in the bathroom?  Use night lights.  Install grab bars by the toilet and in the tub and shower. Do not use towel bars as grab bars.  Use non-skid mats or decals in the tub or shower.  If you need to sit down in the shower, use a plastic, non-slip stool.  Keep the floor dry. Clean up any water that spills on the floor as soon as it happens.  Remove soap buildup in the tub or shower regularly.  Attach bath mats securely with double-sided non-slip rug tape.  Do not have throw rugs and other things on the floor that can make you trip. What can I do in the bedroom?  Use night lights.  Make sure that you have a light by your bed that is easy to reach.  Do not use any sheets or blankets that are too big for your bed. They should not hang down onto the floor.  Have a firm chair that has side arms. You can use this for support while you get dressed.  Do not have throw rugs and other things on the floor that can make you trip. What can I do in the kitchen?  Clean up any spills right away.  Avoid walking  on wet floors.  Keep items that you use a lot in easy-to-reach places.  If you need to reach something above you, use a strong step stool that has a grab bar.  Keep electrical cords out of the way.  Do not use floor polish or wax that makes floors slippery. If you must use wax, use non-skid floor wax.  Do not have throw rugs and other things on the floor that can make you trip. What can I do with my stairs?  Do not leave any items on the stairs.  Make sure that there are handrails on both sides of the stairs and use them. Fix handrails that are broken or loose. Make sure that handrails are as long as the stairways.  Check any carpeting to make sure that it is firmly attached to the stairs. Fix any carpet that is loose or worn.  Avoid having throw rugs at the top or bottom of the stairs. If you do have throw rugs, attach them to the floor with carpet  tape.  Make sure that you have a light switch at the top of the stairs and the bottom of the stairs. If you do not have them, ask someone to add them for you. What else can I do to help prevent falls?  Wear shoes that:  Do not have high heels.  Have rubber bottoms.  Are comfortable and fit you well.  Are closed at the toe. Do not wear sandals.  If you use a stepladder:  Make sure that it is fully opened. Do not climb a closed stepladder.  Make sure that both sides of the stepladder are locked into place.  Ask someone to hold it for you, if possible.  Clearly mark and make sure that you can see:  Any grab bars or handrails.  First and last steps.  Where the edge of each step is.  Use tools that help you move around (mobility aids) if they are needed. These include:  Canes.  Walkers.  Scooters.  Crutches.  Turn on the lights when you go into a dark area. Replace any light bulbs as soon as they burn out.  Set up your furniture so you have a clear path. Avoid moving your furniture around.  If any of your floors are uneven, fix them.  If there are any pets around you, be aware of where they are.  Review your medicines with your doctor. Some medicines can make you feel dizzy. This can increase your chance of falling. Ask your doctor what other things that you can do to help prevent falls. This information is not intended to replace advice given to you by your health care provider. Make sure you discuss any questions you have with your health care provider. Document Released: 10/09/2009 Document Revised: 05/20/2016 Document Reviewed: 01/17/2015 Elsevier Interactive Patient Education  2017 [employer].

## 2021-05-18 NOTE — Progress Notes (Signed)
Subjective:   Jeanne Becker is a 65 y.o. female who presents for Medicare Annual (Subsequent) preventive examination.  Virtual Visit via Telephone Note  I connected with  [name] on 05/18/21 at 10:30 AM EDT by telephone and verified that I am speaking with the correct person using two identifiers.  Location: Patient: Home Provider: [facility] Persons participating in the virtual visit: patient/Nurse Health Advisor   I discussed the limitations, risks, security and privacy concerns of performing an evaluation and management service by telephone and the availability of in person appointments. The patient expressed understanding and agreed to proceed.  Interactive audio and video telecommunications were attempted between this nurse and patient, however failed, due to patient having technical difficulties OR patient did not have access to video capability.  We continued and completed visit with audio only.  Some vital signs may be absent or patient reported.   Caleel Kiner E Lateia Fraser, LPN   Review of Systems     Cardiac Risk Factors include: advanced age (>40mn, >>43women);sedentary lifestyle;smoking/ tobacco exposure;Other (see comment);dyslipidemia, Risk factor comments: COPD     Objective:    Today's Vitals   05/18/21 1035 05/18/21 1036  Weight: 98 lb (44.5 kg)   Height: _0  (1.575 m)   PainSc:  4    Body mass index is 17.92 kg/m.  Advanced Directives 05/18/2021 04/03/2020 08/20/2015 01/08/2015  Does Patient Have a Medical Advance Directive? No No No No  Would patient like information on creating a medical advance directive? Yes (MAU/Ambulatory/Procedural Areas - Information given) No - Patient declined - -    Current Medications (verified) Outpatient Encounter Medications as of 05/18/2021  Medication Sig  . acetaminophen (TYLENOL) 325 MG tablet Take 650 mg by mouth every 6 (six) hours as needed.  .Marland Kitchenalbuterol (PROVENTIL) (2.5 MG/3ML) 0.083% nebulizer solution Take 3 mLs (2.5  mg total) by nebulization every 4 (four) hours as needed for wheezing or shortness of breath.  .Marland Kitchenalbuterol (VENTOLIN HFA) 108 (90 Base) MCG/ACT inhaler INHALE 2 PUFFS INTO THE LUNGS EVERY 4 (FOUR) HOURS AS NEEDED FOR WHEEZING OR SHORTNESS OF BREATH.  .Marland Kitchenaspirin 81 MG EC tablet Take 1 tablet (81 mg total) by mouth daily. Swallow whole.  .Marland KitchenbuPROPion (WELLBUTRIN SR) 150 MG 12 hr tablet Take 1 tablet (150 mg total) by mouth 2 (two) times daily.  . celecoxib (CELEBREX) 100 MG capsule Take 1 capsule (100 mg total) by mouth 2 (two) times daily.  . fluticasone (FLONASE) 50 MCG/ACT nasal spray SPRAY 2 SPRAYS INTO EACH NOSTRIL EVERY DAY  . Fluticasone-Umeclidin-Vilant (TRELEGY ELLIPTA) 100-62.5-25 MCG/INH AEPB Inhale 1 Inhaler into the lungs daily.  . rosuvastatin (CRESTOR) 10 MG tablet Take 1 tablet (10 mg total) by mouth daily.   No facility-administered encounter medications on file as of 05/18/2021.    Allergies (verified) Ivp dye [iodinated diagnostic agents], Lexapro [escitalopram], Other, Penicillins, and Codeine   History: Past Medical History:  Diagnosis Date  . Allergic rhinitis, cause unspecified   . Anxiety state, unspecified   . Chronic airway obstruction, not elsewhere classified   . Depressive disorder, not elsewhere classified   . Essential hypertension, benign   . Fatigue   . H/O diabetes mellitus   . Hyperlipidemia   . Lower back pain   . Menopause   . Panic disorder without agoraphobia   . Seborrheic keratosis   . Unspecified arthropathy, shoulder region    Past Surgical History:  Procedure Laterality Date  . APPENDECTOMY    .  BREAST BIOPSY  12/2012   benign  . CESAREAN SECTION    . TONSILLECTOMY    . TUBAL LIGATION     Family History  Problem Relation Age of Onset  . Cancer Other   . Allergies Other   . Diabetes Other   . Heart disease Other   . Hypertension Other   . Migraines Other   . Osteoporosis Other   . Seizures Other   . COPD Mother   . Heart disease  Mother   . Asthma Son   . Stroke Father   . Heart attack Brother   . Aneurysm Brother   . Throat cancer Maternal Grandfather   . Diabetes Paternal Grandmother   . Heart attack Paternal Grandmother   . Pneumonia Paternal Grandfather    Social History   Socioeconomic History  . Marital status: Married    Spouse name: Not on file  . Number of children: 2  . Years of education: Not on file  . Highest education level: Not on file  Occupational History  . Not on file  Tobacco Use  . Smoking status: Current Every Day Smoker    Packs/day: 1.50    Years: 30.00    Pack years: 45.00    Types: Cigarettes    Start date: 50  . Smokeless tobacco: Never Used  . Tobacco comment: currently smoking 1ppd as of 02/27/21 ep  Vaping Use  . Vaping Use: Never used  Substance and Sexual Activity  . Alcohol use: Not Currently    Alcohol/week: 12.0 standard drinks    Types: 12 Cans of beer per week    Comment: 2 drinks/day or fewer  . Drug use: Not Currently    Types: Marijuana  . Sexual activity: Yes    Partners: Male    Birth control/protection: Surgical  Other Topics Concern  . Not on file  Social History Narrative   Originally from [place]. Always lived in [place]. 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 Also worked in [employer] She has a dog currently. Remote exposure to love birds. No mold exposure. Husband and granddaughter live with her. She has 2 sons.   Social Determinants of Health   Financial Resource Strain: Low Risk   . Difficulty of Paying Living Expenses: Not very hard  Food Insecurity: No Food Insecurity  . Worried About Charity fundraiser in the Last Year: Never true  . Ran Out of Food in the Last Year: Never true  Transportation Needs: No Transportation Needs  . Lack of Transportation (Medical): No  . Lack of Transportation (Non-Medical): No  Physical Activity: Insufficiently Active  . Days of Exercise per Week: 7 days  . Minutes of Exercise per  Session: 10 min  Stress: Stress Concern Present  . Feeling of Stress : Rather much  Social Connections: Moderately Isolated  . Frequency of Communication with Friends and Family: More than three times a week  . Frequency of Social Gatherings with Friends and Family: Once a week  . Attends Religious Services: Never  . Active Member of Clubs or Organizations: No  . Attends Archivist Meetings: Never  . Marital Status: Married    Tobacco Counseling Ready to quit: Yes Counseling given: Yes Comment: currently smoking 1ppd as of 02/27/21 ep   Clinical Intake:  Pre-visit preparation completed: Yes  Pain : 0-10 Pain Score: 4  Pain Type: Neuropathic pain Pain Location: Foot Pain Orientation: Right,Left Pain Descriptors / Indicators: Burning,Sore Pain Onset: More  than a month ago Pain Frequency: Constant     BMI - recorded: 17.92 Nutritional Status: BMI <19  Underweight Nutritional Risks: None Diabetes: No  How often do you need to have someone help you when you read instructions, pamphlets, or other written materials from your doctor or pharmacy?: 1 - Never  Diabetic? No  Interpreter Needed?: No  Information entered by :: Arnetra Terris, LPN   Activities of Daily Living In your present state of health, do you have any difficulty performing the following activities: 05/18/2021  Hearing? N  Vision? N  Difficulty concentrating or making decisions? N  Walking or climbing stairs? Y  Dressing or bathing? N  Doing errands, shopping? N  Preparing Food and eating ? N  Using the Toilet? N  In the past six months, have you accidently leaked urine? N  Do you have problems with loss of bowel control? N  Managing your Medications? N  Managing your Finances? N  Housekeeping or managing your Housekeeping? N  Some recent data might be hidden    Patient Care Team: Loman Brooklyn, FNP as PCP - General (Family Medicine) Collene Gobble, MD as Consulting Physician  (Pulmonary Disease) Harlen Labs, MD as Referring Physician (Optometry)  Indicate any recent Medical Services you may have received from other than [facility] providers in the past year (date may be approximate).     Assessment:   This is a routine wellness examination for Raena.  Hearing/Vision screen  Hearing Screening   _0  _1  _2  _3  _4  _5  _6  _7  _8   Right ear:           Left ear:           Comments: Denies hearing difficulties  Vision Screening Comments: Wears eyeglasses - Routine visits with Dr Marin Comment in [place] - behind on eye exam.  Dietary issues and exercise activities discussed: Current Exercise Habits: Home exercise routine, Type of exercise: walking, Time (Minutes): 30, Frequency (Times/Week): 3, Weekly Exercise (Minutes/Week): 90, Intensity: Mild, Exercise limited by: orthopedic condition(s)  Goals Addressed              This Visit's Progress   .  Have 3 meals a day (pt-stated)   On track     Pt does not have much of an appetite, she will try to eat alittle something at breakfast, lunch and supper.      Depression Screen PHQ 2/9 Scores 05/18/2021 05/15/2021 01/23/2021 09/11/2020 08/02/2020 06/17/2020 04/30/2020  PHQ - 2 Score 0 0 0 0 1 0 1  PHQ- 9 Score _9 Fall Risk Fall Risk  05/18/2021 05/15/2021 07/30/2020 06/17/2020 04/30/2020  Falls in the past year? 0 0 0 1 1  Number falls in past yr: 0 - - 1 1  Injury with Fall? 0 - - 0 0  Risk for fall due to : Orthopedic patient;Impaired vision - - - -  Follow up Education provided;Falls prevention discussed - - Falls prevention discussed Education provided    FALL RISK PREVENTION PERTAINING TO THE HOME:  Any stairs in or around the home? Yes  If so, are there any without handrails? No  Home free of loose throw rugs in walkways, pet beds, electrical cords, etc? Yes  Adequate lighting in your home to reduce risk of falls? Yes   ASSISTIVE DEVICES UTILIZED TO PREVENT FALLS:  Life  alert? No  Use of a cane, walker or w/c? No  Grab bars  in the bathroom? Yes  Shower chair or bench in shower? Yes  Elevated toilet seat or a handicapped toilet? Yes   TIMED UP AND GO:  Was the test performed? No . Telephonic visit  Cognitive Function: Normal cognitive status assessed by direct observation by this Nurse Health Advisor. No abnormalities found.      6CIT Screen 04/03/2020  What Year? 0 points  What month? 0 points  What time? 0 points  Count back from 20 0 points  Months in reverse 2 points  Repeat phrase 2 points  Total Score 4    Immunizations Immunization History  Administered Date(s) Administered  . DTaP 01/28/2014  . Influenza Whole 10/07/2012  . Influenza, High Dose Seasonal PF 11/11/2016, 09/15/2017  . Influenza,inj,Quad PF,6+ Mos 11/06/2012, 08/31/2013, 01/08/2015, 02/19/2016, 09/14/2018, 09/21/2019  . Influenza-Unspecified 11/06/2012, 02/19/2016, 11/11/2016, 09/15/2017  . MMR 10/20/2001  . Moderna Sars-Covid-2 Vaccination 09/22/2020, 10/20/2020  . Pneumococcal Conjugate-13 07/17/2014  . Pneumococcal Polysaccharide-23 08/31/2013  . Tdap 01/29/2014    TDAP status: Up to date  Flu Vaccine status: Due, Education has been provided regarding the importance of this vaccine. Advised may receive this vaccine at local pharmacy or Health Dept. Aware to provide a copy of the vaccination record if obtained from local pharmacy or Health Dept. Verbalized acceptance and understanding.  Pneumococcal vaccine status: Up to date  Covid-19 vaccine status: Completed vaccines  Qualifies for Shingles Vaccine? Yes   Zostavax completed No   Shingrix Completed?: No.    Education has been provided regarding the importance of this vaccine. Patient has been advised to call insurance company to determine out of pocket expense if they have not yet received this vaccine. Advised may also receive vaccine at local pharmacy or Health Dept. Verbalized acceptance and  understanding.  Screening Tests Health Maintenance  Topic Date Due  . COVID-19 Vaccine (3 - Booster for Moderna series) 03/20/2021  . MAMMOGRAM  04/29/2021  . DEXA SCAN  01/23/2022 (Originally 01/12/2021)  . PNA vac Low Risk Adult (2 of 2 - PPSV23) 01/23/2022 (Originally 01/12/2021)  . Hepatitis C Screening  05/15/2022 (Originally 01/12/1974)  . HIV Screening  05/15/2022 (Originally 01/12/1971)  . INFLUENZA VACCINE  07/27/2021  . Fecal DNA (Cologuard)  04/11/2023  . TETANUS/TDAP  01/30/2024  . PAP SMEAR-Modifier  07/30/2025  . HPV VACCINES  Aged Out    Health Maintenance  Health Maintenance Due  Topic Date Due  . COVID-19 Vaccine (3 - Booster for Moderna series) 03/20/2021  . MAMMOGRAM  04/29/2021    Colorectal cancer screening: Type of screening: Cologuard. Completed 04/10/2020. Repeat every 3 years  Mammogram status: Completed 05/02/2020. Repeat every year  Bone Density status: Ordered 04/2021. Pt provided with contact info and advised to call to schedule appt.  Lung Cancer Screening: (Low Dose CT Chest recommended if Age 66-80 years, 30 pack-year currently smoking OR have quit w/in 15years.) does qualify. Has been doing these for the past 3 years - next due 11/2021  Additional Screening:  Hepatitis C Screening: does qualify; Due at next visit  Vision Screening: Recommended annual ophthalmology exams for early detection of glaucoma and other disorders of the eye. Is the patient up to date with their annual eye exam?  No  Who is the provider or what is the name of the office in which the patient attends annual eye exams? Dr Marin Comment If pt is not established with a provider, would they like to be referred to a provider to establish care? No .  Dental Screening: Recommended annual dental exams for proper oral hygiene  Community Resource Referral / Chronic Care Management: CRR required this visit?  No   CCM required this visit?  No      Plan:     I have personally reviewed and  noted the following in the patient's chart:   . Medical and social history . Use of alcohol, tobacco or illicit drugs  . Current medications and supplements including opioid prescriptions.  . Functional ability and status . Nutritional status . Physical activity . Advanced directives . List of other physicians . Hospitalizations, surgeries, and ER visits in previous 12 months . Vitals . Screenings to include cognitive, depression, and falls . Referrals and appointments  In addition, I have reviewed and discussed with patient certain preventive protocols, quality metrics, and best practice recommendations. A written personalized care plan for preventive services as well as general preventive health recommendations were provided to patient.     Sandrea Hammond, LPN   5/33/1740   Nurse Notes: None

## 2021-05-19 ENCOUNTER — Encounter: Payer: Self-pay | Admitting: Family Medicine

## 2021-06-07 ENCOUNTER — Other Ambulatory Visit: Payer: Self-pay | Admitting: Family Medicine

## 2021-06-07 DIAGNOSIS — F17200 Nicotine dependence, unspecified, uncomplicated: Secondary | ICD-10-CM

## 2021-06-15 ENCOUNTER — Telehealth: Payer: Self-pay | Admitting: Emergency Medicine

## 2021-06-15 MED ORDER — ALBUTEROL SULFATE HFA 108 (90 BASE) MCG/ACT IN AERS: 2.0000 | INHALATION_SPRAY | RESPIRATORY_TRACT | 4 refills | Status: DC | PRN

## 2021-06-15 MED ORDER — ALBUTEROL SULFATE (2.5 MG/3ML) 0.083% IN NEBU: 2.5000 mg | INHALATION_SOLUTION | RESPIRATORY_TRACT | 3 refills | Status: DC | PRN

## 2021-06-15 MED ORDER — BENZONATATE 100 MG PO CAPS: 200.0000 mg | ORAL_CAPSULE | Freq: Four times a day (QID) | ORAL | 1 refills | Status: DC | PRN

## 2021-06-15 MED ORDER — PREDNISONE 10 MG PO TABS: ORAL_TABLET | ORAL | 0 refills | Status: DC

## 2021-06-15 NOTE — Telephone Encounter (Signed)
Pt stated that they went to the ER about 3 wks ago; due to some ear pain and they prescribed her some steriods and an antibiotic and she said that it made her feel better but she said now she is having some dryness in her nose, and she said that she believes her lymph nodes are swollen, pt does have a cough (states that it is colorless), pt states she does have a little bit of wheezing (states that symptoms began to worsen over this past weekend). Pt states that she has been using her Trelegy and she said she is also wanting to get something that does not cough as much. Pt states that she is also requesting some medicine for her nebulizer machine. [phone] [place], [place] - [address]. Pls regard; [phone]

## 2021-06-15 NOTE — Telephone Encounter (Signed)
Spoke with Jeanne Becker and reviewed Dr. Kavin Leech recommendations with her. Jeanne Becker stated understanding. [facility] [place]. Nothing further needed at this time.

## 2021-06-15 NOTE — Telephone Encounter (Signed)
Patient is returning phone call. Patient phone number is [phone].

## 2021-06-15 NOTE — Telephone Encounter (Signed)
Called and spoke with Patient.  Patient stated over the weekend she started having increased cough, with clear sputum, wheezing, swollen lymph nodes, and is complaining with dry nose, throat. Patient denies any chills or fever. Patient stated she is using flonase, Trelegy inhaler daily, albuterol inhaler, and albuterol nebs to help with wheezing. Patient requested recommendations or prescriptions to help with cough.  Patient requested refills for albuterol and albut[facility]ol nebs to [facility] [place]. Albuterol and albuterol neb refills sent to requested pharmacy.     Message routed to Dr. Delton Coombes to advise on cough

## 2021-06-15 NOTE — Telephone Encounter (Signed)
1 - it sounds like she may need COVID testing  2- OK to give her pred taper for an exacerbation > Take 40mg  daily for 3 days, then 30mg  daily for 3 days, then 20mg  daily for 3 days, then 10mg  daily for 3 days, then stop 3- try tessalon perles 200mg  q6h prn for cough, #30, 0RF

## 2021-06-15 NOTE — Telephone Encounter (Signed)
Lm for patient.  

## 2021-06-16 ENCOUNTER — Other Ambulatory Visit: Payer: Self-pay | Admitting: Emergency Medicine

## 2021-06-22 ENCOUNTER — Other Ambulatory Visit: Payer: Self-pay | Admitting: Family Medicine

## 2021-06-22 ENCOUNTER — Other Ambulatory Visit: Payer: Self-pay | Admitting: Emergency Medicine

## 2021-06-22 ENCOUNTER — Other Ambulatory Visit: Payer: Self-pay | Admitting: Family

## 2021-06-22 DIAGNOSIS — I700 Atherosclerosis of aorta: Secondary | ICD-10-CM

## 2021-06-22 DIAGNOSIS — E782 Mixed hyperlipidemia: Secondary | ICD-10-CM

## 2021-06-22 DIAGNOSIS — F411 Generalized anxiety disorder: Secondary | ICD-10-CM

## 2021-06-22 DIAGNOSIS — J441 Chronic obstructive pulmonary disease with (acute) exacerbation: Secondary | ICD-10-CM

## 2021-06-25 ENCOUNTER — Encounter: Payer: Self-pay | Admitting: Family Medicine

## 2021-06-25 ENCOUNTER — Ambulatory Visit (INDEPENDENT_AMBULATORY_CARE_PROVIDER_SITE_OTHER): Payer: Medicare Other | Admitting: Family Medicine

## 2021-06-25 ENCOUNTER — Other Ambulatory Visit: Payer: Self-pay

## 2021-06-25 ENCOUNTER — Ambulatory Visit (INDEPENDENT_AMBULATORY_CARE_PROVIDER_SITE_OTHER): Payer: Medicare Other

## 2021-06-25 VITALS — BP 130/78 | HR 100 | Temp 98.1°F | Ht 62.0 in | Wt 99.4 lb

## 2021-06-25 DIAGNOSIS — M8580 Other specified disorders of bone density and structure, unspecified site: Secondary | ICD-10-CM

## 2021-06-25 DIAGNOSIS — F17200 Nicotine dependence, unspecified, uncomplicated: Secondary | ICD-10-CM

## 2021-06-25 DIAGNOSIS — Z780 Asymptomatic menopausal state: Secondary | ICD-10-CM

## 2021-06-25 DIAGNOSIS — M1990 Unspecified osteoarthritis, unspecified site: Secondary | ICD-10-CM

## 2021-06-25 HISTORY — DX: Other specified disorders of bone density and structure, unspecified site: M85.80

## 2021-06-25 NOTE — Progress Notes (Signed)
Assessment & Plan:  1. Arthritis Well controlled on current regimen.   2. Current smoker Patient will continue to decrease the amount she smokes without medication.  3. Post-menopausal - DG [facility] DEXA; Future   Return in about 4 months (around 10/25/2021) for follow-up of chronic medication conditions.  Hendricks Limes, MSN, APRN, FNP-C [facility]  Subjective:    Patient ID: Jeanne Becker, female    DOB: 06/18/1956, 65 y.o.   MRN: [id number]  Patient Care Team: Loman Brooklyn, FNP as PCP - General (Family Medicine) Collene Gobble, MD as Consulting Physician (Pulmonary Disease) Harlen Labs, MD as Referring Physician (Optometry)   Chief Complaint:  Chief Complaint  Patient presents with   Arthritis    6 week follow- celebrex helps a little    Nicotine Dependence    6 week follow up- still smoking     HPI: Jeanne Becker is a 65 y.o. female presenting on 06/25/2021 for Arthritis (6 week follow- celebrex helps a little/) and Nicotine Dependence (6 week follow up- still smoking )  Arthritis pain: patient was given a prescription for Celebrex at our last visit. She reports it does help when she takes it, but she does not like to take it very often.  Tobacco Use: patient was unable to tolerate the Wellbutrin as it made her nauseated. She was able to decrease the amount she smokes from 1.5 packs per day to 1 pack per day. She gets yearly low dose lung cancer screening CT scans with her pulmonologist.   New complaints: None  Social history:  Relevant past medical, surgical, family and social history reviewed and updated as indicated. Interim medical history since our last visit reviewed.  Allergies and medications reviewed and updated.  DATA REVIEWED: CHART IN EPIC  ROS: Negative unless specifically indicated above in HPI.    Current Outpatient Medications:    acetaminophen (TYLENOL) 325 MG tablet, Take 650 mg by mouth every 6 (six)  hours as needed., Disp: , Rfl:    albuterol (PROVENTIL) (2.5 MG/3ML) 0.083% nebulizer solution, Take 3 mLs (2.5 mg total) by nebulization every 4 (four) hours as needed for wheezing or shortness of breath., Disp: 450 mL, Rfl: 3   albuterol (VENTOLIN HFA) 108 (90 Base) MCG/ACT inhaler, Inhale 2 puffs into the lungs every 4 (four) hours as needed for wheezing or shortness of breath., Disp: 8.5 each, Rfl: 4   aspirin 81 MG EC tablet, Take 1 tablet (81 mg total) by mouth daily. Swallow whole., Disp: 30 tablet, Rfl: 12   benzonatate (TESSALON) 100 MG capsule, Take 2 capsules (200 mg total) by mouth every 6 (six) hours as needed for cough., Disp: 30 capsule, Rfl: 1   celecoxib (CELEBREX) 100 MG capsule, Take 1 capsule (100 mg total) by mouth 2 (two) times daily., Disp: 60 capsule, Rfl: 2   fluticasone (FLONASE) 50 MCG/ACT nasal spray, SPRAY 2 SPRAYS INTO EACH NOSTRIL EVERY DAY, Disp: 48 mL, Rfl: 0   Fluticasone-Umeclidin-Vilant (TRELEGY ELLIPTA) 100-62.5-25 MCG/INH AEPB, Inhale 1 Inhaler into the lungs daily., Disp: 1 each, Rfl: 11   predniSONE (DELTASONE) 10 MG tablet, Take 4 tablets X 3 days, 3 tabs X 3 days, 2 tabs x 3 days, 1 tab x 3 days, Disp: 30 tablet, Rfl: 0   rosuvastatin (CRESTOR) 10 MG tablet, TAKE 1 TABLET BY MOUTH EVERY DAY, Disp: 90 tablet, Rfl: 0   Allergies  Allergen Reactions   Ivp Dye [Iodinated Diagnostic Agents]  Lexapro [Escitalopram] Nausea And Vomiting   Other    Penicillins    Codeine Rash   Past Medical History:  Diagnosis Date   Allergic rhinitis, cause unspecified    Anxiety state, unspecified    Chronic airway obstruction, not elsewhere classified    Depressive disorder, not elsewhere classified    Essential hypertension, benign    Fatigue    H/O diabetes mellitus    Hyperlipidemia    Lower back pain    Menopause    Panic disorder without agoraphobia    Seborrheic keratosis    Unspecified arthropathy, shoulder region     Past Surgical History:  Procedure  Laterality Date   APPENDECTOMY     BREAST BIOPSY  12/2012   benign   CESAREAN SECTION     TONSILLECTOMY     TUBAL LIGATION      Social History   Socioeconomic History   Marital status: Married    Spouse name: Not on file   Number of children: 2   Years of education: Not on file   Highest education level: Not on file  Occupational History   Not on file  Tobacco Use   Smoking status: Every Day    Packs/day: 1.50    Years: 30.00    Pack years: 45.00    Types: Cigarettes    Start date: 1974   Smokeless tobacco: Never   Tobacco comments:    currently smoking 1ppd as of 02/27/21 ep  Vaping Use   Vaping Use: Never used  Substance and Sexual Activity   Alcohol use: Not Currently    Alcohol/week: 12.0 standard drinks    Types: 12 Cans of beer per week    Comment: 2 drinks/day or fewer   Drug use: Not Currently    Types: Marijuana   Sexual activity: Yes    Partners: Male    Birth control/protection: Surgical  Other Topics Concern   Not on file  Social History Narrative   Originally from [place]. Always lived in [place]. 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 Also worked in [employer] She has a dog currently. Remote exposure to love birds. No mold exposure. Husband and granddaughter live with her. She has 2 sons.   Social Determinants of Health   Financial Resource Strain: Low Risk    Difficulty of Paying Living Expenses: Not very hard  Food Insecurity: No Food Insecurity   Worried About Charity fundraiser in the Last Year: Never true   Ran Out of Food in the Last Year: Never true  Transportation Needs: No Transportation Needs   Lack of Transportation (Medical): No   Lack of Transportation (Non-Medical): No  Physical Activity: Insufficiently Active   Days of Exercise per Week: 7 days   Minutes of Exercise per Session: 10 min  Stress: Stress Concern Present   Feeling of Stress : Rather much  Social Connections: Moderately Isolated   Frequency of  Communication with Friends and Family: More than three times a week   Frequency of Social Gatherings with Friends and Family: Once a week   Attends Religious Services: Never   Marine scientist or Organizations: No   Attends Music therapist: Never   Marital Status: Married  Human resources officer Violence: Not At Risk   Fear of Current or Ex-Partner: No   Emotionally Abused: No   Physically Abused: No   Sexually Abused: No        Objective:    BP  130/78   Pulse 100   Temp 98.1 F (36.7 C) (Temporal)   Ht '5\' 2"'  (1.575 m)   Wt 99 lb 6.4 oz (45.1 kg)   SpO2 96%   BMI 18.18 kg/m   Wt Readings from Last 3 Encounters:  06/25/21 99 lb 6.4 oz (45.1 kg)  05/18/21 98 lb (44.5 kg)  05/15/21 97 lb (44 kg)    Physical Exam Vitals reviewed.  Constitutional:      General: She is not in acute distress.    Appearance: Normal appearance. She is overweight. She is not ill-appearing, toxic-appearing or diaphoretic.  HENT:     Head: Normocephalic and atraumatic.  Eyes:     General: No scleral icterus.       Right eye: No discharge.        Left eye: No discharge.     Conjunctiva/sclera: Conjunctivae normal.  Cardiovascular:     Rate and Rhythm: Normal rate.  Pulmonary:     Effort: Pulmonary effort is normal. No respiratory distress.  Musculoskeletal:        General: Normal range of motion.     Cervical back: Normal range of motion.  Skin:    General: Skin is warm and dry.     Capillary Refill: Capillary refill takes less than 2 seconds.  Neurological:     General: No focal deficit present.     Mental Status: She is alert and oriented to person, place, and time. Mental status is at baseline.  Psychiatric:        Mood and Affect: Mood normal.        Behavior: Behavior normal.        Thought Content: Thought content normal.        Judgment: Judgment normal.    No results found for: TSH Lab Results  Component Value Date   WBC 5.5 05/15/2021   HGB 15.1 05/15/2021    HCT 43.5 05/15/2021   MCV 101 (H) 05/15/2021   PLT 187 05/15/2021   Lab Results  Component Value Date   NA 142 05/15/2021   K 3.8 05/15/2021   CO2 24 05/15/2021   GLUCOSE 116 (H) 05/15/2021   BUN 9 05/15/2021   CREATININE 0.92 05/15/2021   BILITOT 0.3 05/15/2021   ALKPHOS 64 05/15/2021   AST 20 05/15/2021   ALT 15 05/15/2021   PROT 6.1 05/15/2021   ALBUMIN 4.4 05/15/2021   CALCIUM 9.1 05/15/2021   EGFR 69 05/15/2021   Lab Results  Component Value Date   CHOL 156 05/15/2021   Lab Results  Component Value Date   HDL 51 05/15/2021   Lab Results  Component Value Date   LDLCALC 80 05/15/2021   Lab Results  Component Value Date   TRIG 142 05/15/2021   Lab Results  Component Value Date   CHOLHDL 3.1 05/15/2021   Lab Results  Component Value Date   HGBA1C 5.3 03/19/2020

## 2021-06-26 DIAGNOSIS — M85851 Other specified disorders of bone density and structure, right thigh: Secondary | ICD-10-CM | POA: Diagnosis not present

## 2021-06-28 ENCOUNTER — Encounter: Payer: Self-pay | Admitting: Family Medicine

## 2021-06-30 ENCOUNTER — Encounter: Payer: Self-pay | Admitting: Family Medicine

## 2021-07-15 IMAGING — CT CT CHEST LUNG CANCER SCREENING LOW DOSE W/O CM
2 of 3 series · 15 of 36 positions shown, 18 images · non-contrast
Comparison: Eleven left 20

CLINICAL DATA: Thirty-two pack-year smoking history. Current
smoker.

EXAM:
CT CHEST WITHOUT CONTRAST LOW-DOSE FOR LUNG CANCER SCREENING
TECHNIQUE: Multidetector CT imaging of the chest was performed following the
standard protocol without IV contrast.

[Series 2: axial st · axial · 0.55mm/px · z∈[+1180,+1465]mm · 12 of 67 slices shown, 15 images]
[im 5/67  mediastinal]
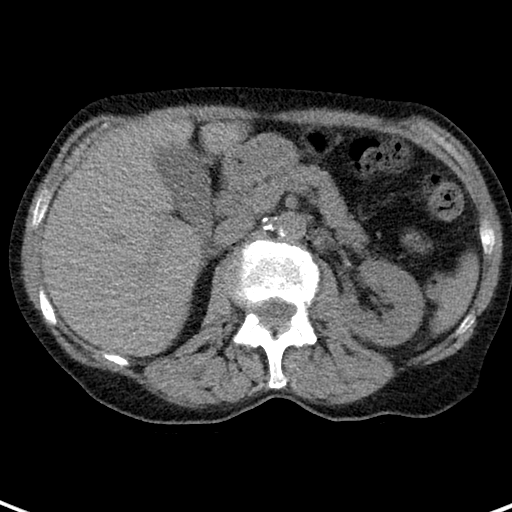
[im 5/67  lung]
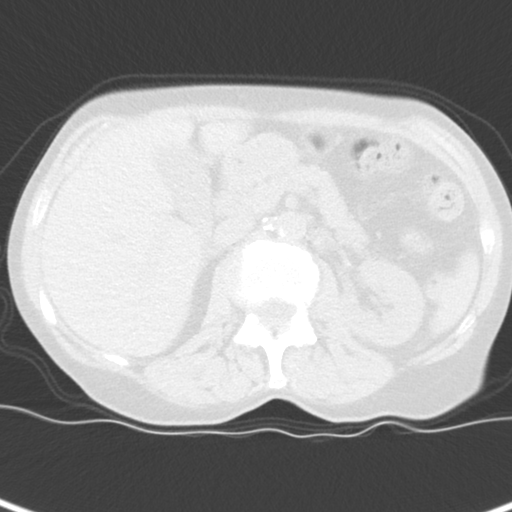
[im 10/67  lung]
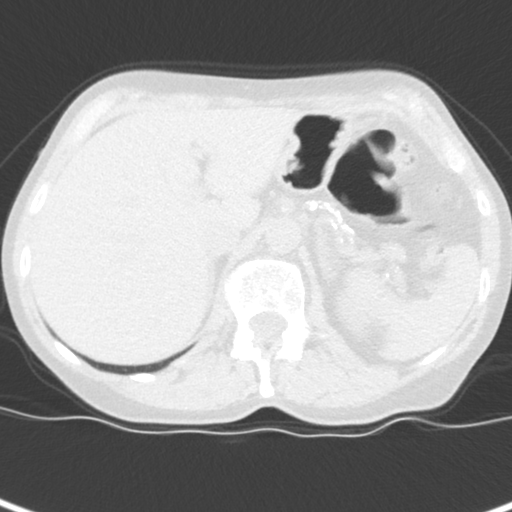
[im 15/67  lung]
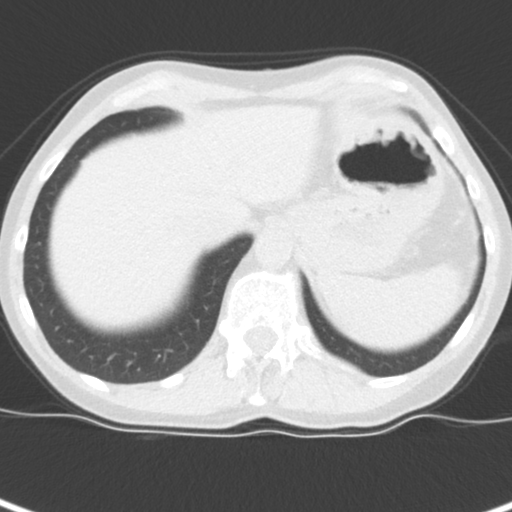
[im 20/67  lung]
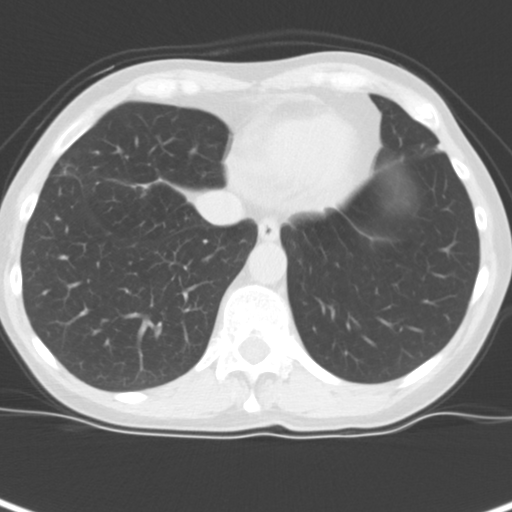
[im 25/67  mediastinal]
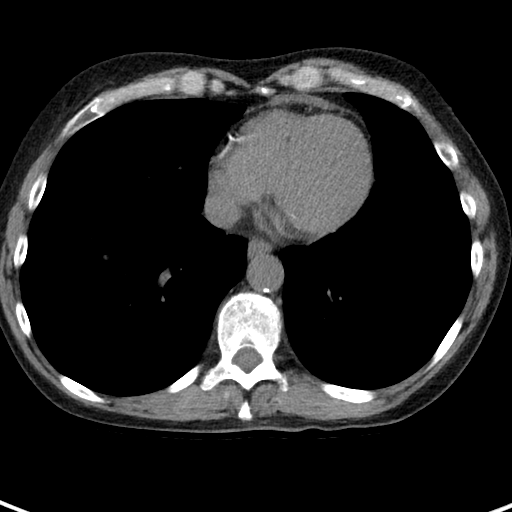
[im 25/67  lung]
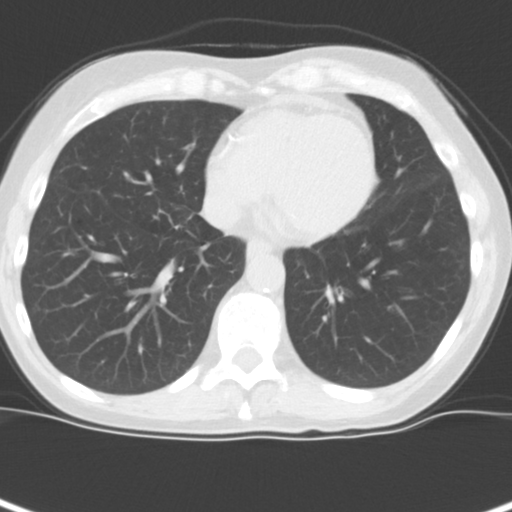
[im 30/67  lung]
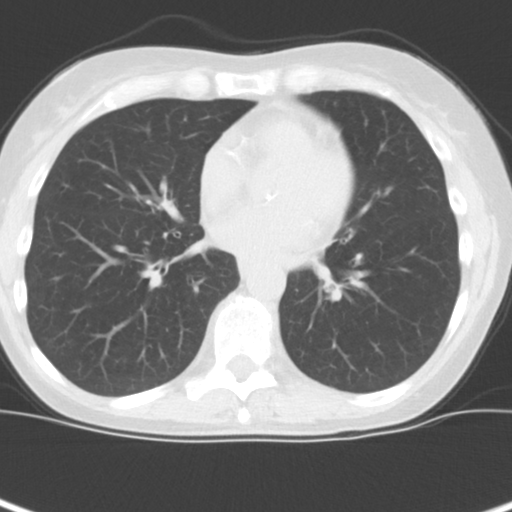
[im 37/67  lung]
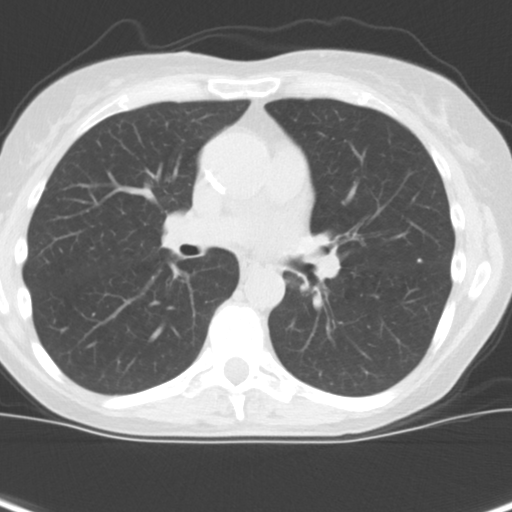
[im 42/67  lung]
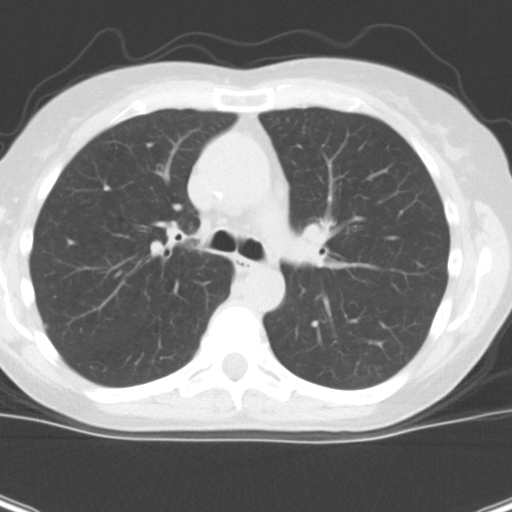
[im 47/67  mediastinal]
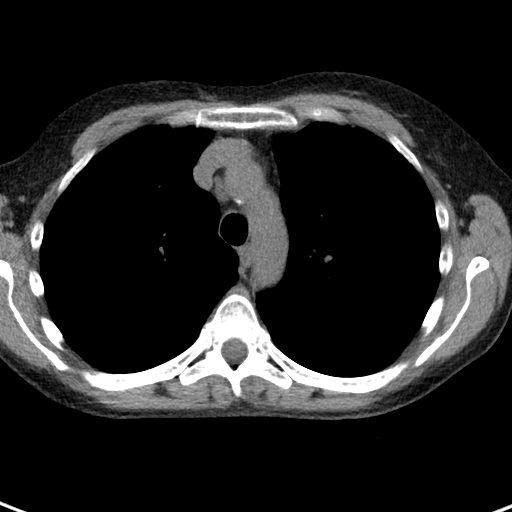
[im 47/67  lung]
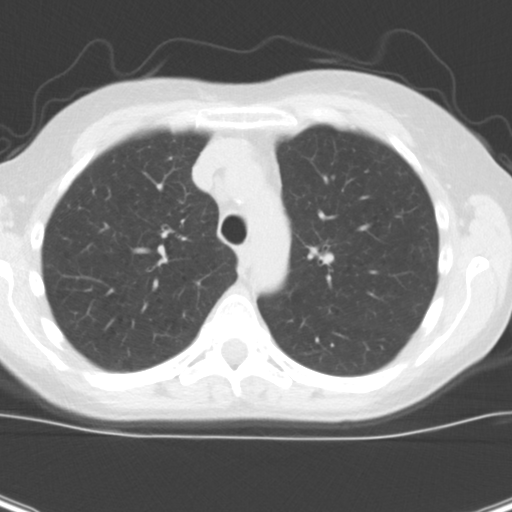
[im 52/67  lung]
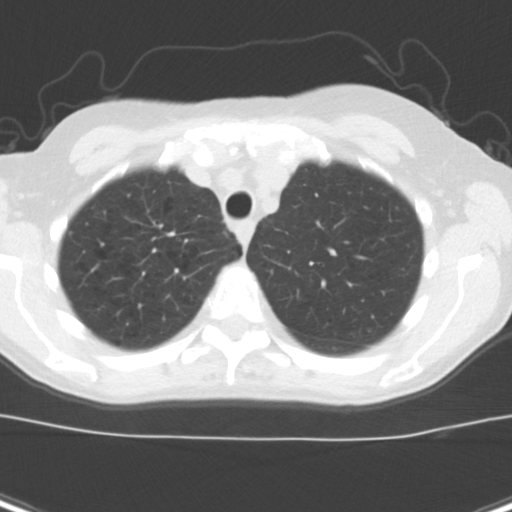
[im 57/67  lung]
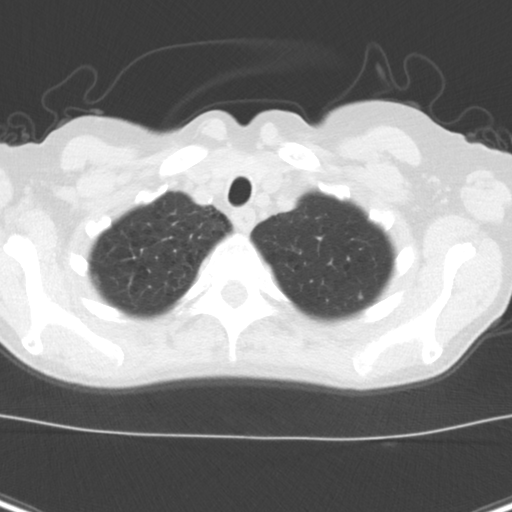
[im 62/67  lung]
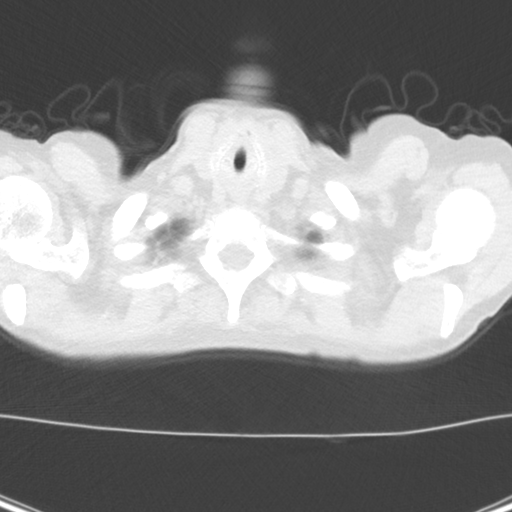

[Series 5: coronal · coronal · 0.64mm/px · 3 of 215 slices shown]
[im 43/215  lung]
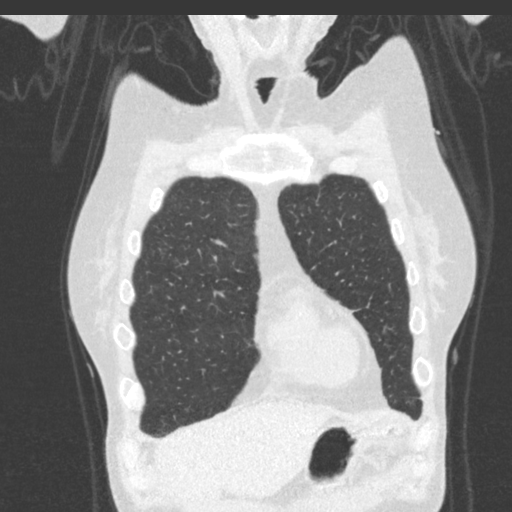
[im 86/215  lung]
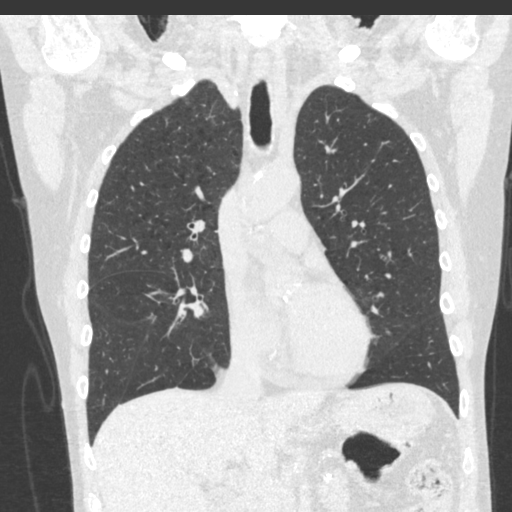
[im 129/215  lung]
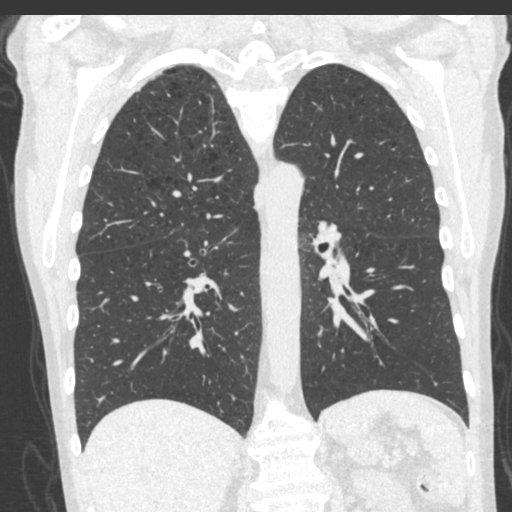

[15 of 36 positions shown; findings below may reference images not displayed]

FINDINGS: Cardiovascular: Aortic atherosclerosis. Normal heart size with
minimal pericardial fluid or thickening, likely physiologic.
Multivessel coronary artery atherosclerosis.

Mediastinum/Nodes: No mediastinal or definite hilar adenopathy,
given limitations of unenhanced CT.

Lungs/Pleura: No pleural fluid. Mild centrilobular and paraseptal
emphysema. Bilateral pulmonary nodules. The largest solid nodule is
similar at volume derived equivalent diameter 4.0 mm.

Upper Abdomen: Normal imaged portions of the liver, spleen, stomach,
pancreas, gallbladder, adrenal glands, kidneys.

Musculoskeletal: Minimal convex right thoracic spine curvature.
IMPRESSION: 1. Lung-RADS 2, benign appearance or behavior. Continue annual
screening with low-dose chest CT without contrast in 12 months.
2. Aortic Atherosclerosis (WOC1S-LTA.A) and Emphysema (WOC1S-B96.C).
3. Age advanced coronary artery atherosclerosis. Recommend
assessment of coronary risk factors and consideration of medical
therapy.

## 2021-08-05 ENCOUNTER — Telehealth: Payer: Self-pay | Admitting: Emergency Medicine

## 2021-08-05 MED ORDER — AZITHROMYCIN 250 MG PO TABS: ORAL_TABLET | ORAL | 0 refills | Status: DC

## 2021-08-05 NOTE — Telephone Encounter (Signed)
I strongly suggest that she get the COVID test as her symptoms are indistinguishable from COVID-19 Okay to write for azithromycin Z-Pak as she is at risk for bronchitis in the setting of a viral URI.

## 2021-08-05 NOTE — Telephone Encounter (Signed)
Call made to patient, confirmed DOB. Made aware of RB recommendations. Zpack sent. Patient is going to contact pharmacy to get covid test. She will contact us with results.   Nothing further needed at this time.

## 2021-08-05 NOTE — Telephone Encounter (Signed)
Called and spoke to pt. Pt c/o sinus congestion, (clear nasal mucus), PND, sore throat, left ear pain, wheezing, slight increase in SOB, increase in prod cough with white mucus (baseline mucus color), s/s x 3-4 days. Pt denies CP/tightness and f/c/s. Pt is requesting zpak. Pt states she doesn't think she has covid, doesn't leave the house much. Pt last seen on 02/27/2021 for COPD and advised to f/u in 6 months. Pt has upcoming appt on 09/09/21 with RB.   Pt states ok to leave detailed VM if she doesn't answer. [facility] [address] [place].    Dr. Delton Coombes please advise. Thanks.

## 2021-08-06 ENCOUNTER — Telehealth: Payer: Self-pay | Admitting: Emergency Medicine

## 2021-08-06 NOTE — Telephone Encounter (Signed)
Thank you for letting me know

## 2021-08-06 NOTE — Telephone Encounter (Signed)
Call made to patient, confirmed DOB. Made aware we got her message and we would be sure to let her provider know. This was the result of a home covid test.   Rb just FYI.   Nothing further needed at this time.

## 2021-09-09 ENCOUNTER — Ambulatory Visit: Payer: Medicare Other | Admitting: Emergency Medicine

## 2021-09-11 DIAGNOSIS — H6983 Other specified disorders of Eustachian tube, bilateral: Secondary | ICD-10-CM | POA: Diagnosis not present

## 2021-09-11 DIAGNOSIS — J441 Chronic obstructive pulmonary disease with (acute) exacerbation: Secondary | ICD-10-CM | POA: Diagnosis not present

## 2021-09-11 DIAGNOSIS — J029 Acute pharyngitis, unspecified: Secondary | ICD-10-CM | POA: Diagnosis not present

## 2021-09-11 DIAGNOSIS — R059 Cough, unspecified: Secondary | ICD-10-CM | POA: Diagnosis not present

## 2021-09-11 DIAGNOSIS — Z681 Body mass index (BMI) 19 or less, adult: Secondary | ICD-10-CM | POA: Diagnosis not present

## 2021-09-28 ENCOUNTER — Other Ambulatory Visit (HOSPITAL_COMMUNITY): Payer: Self-pay

## 2021-09-28 ENCOUNTER — Encounter: Payer: Self-pay | Admitting: Primary Care

## 2021-09-28 ENCOUNTER — Telehealth: Payer: Self-pay | Admitting: Primary Care

## 2021-09-28 ENCOUNTER — Other Ambulatory Visit: Payer: Self-pay

## 2021-09-28 ENCOUNTER — Ambulatory Visit: Payer: Medicare Other | Admitting: Primary Care

## 2021-09-28 VITALS — BP 118/78 | HR 91 | Temp 98.2°F | Ht 62.0 in | Wt 100.2 lb

## 2021-09-28 DIAGNOSIS — F17200 Nicotine dependence, unspecified, uncomplicated: Secondary | ICD-10-CM | POA: Diagnosis not present

## 2021-09-28 DIAGNOSIS — J449 Chronic obstructive pulmonary disease, unspecified: Secondary | ICD-10-CM

## 2021-09-28 DIAGNOSIS — Z23 Encounter for immunization: Secondary | ICD-10-CM | POA: Diagnosis not present

## 2021-09-28 DIAGNOSIS — J309 Allergic rhinitis, unspecified: Secondary | ICD-10-CM | POA: Diagnosis not present

## 2021-09-28 MED ORDER — ALBUTEROL SULFATE (2.5 MG/3ML) 0.083% IN NEBU: 2.5000 mg | INHALATION_SOLUTION | RESPIRATORY_TRACT | 3 refills | Status: DC | PRN

## 2021-09-28 MED ORDER — FLUTICASONE-SALMETEROL 250-50 MCG/ACT IN AEPB: 1.0000 | INHALATION_SPRAY | Freq: Two times a day (BID) | RESPIRATORY_TRACT | 11 refills | Status: DC

## 2021-09-28 NOTE — Telephone Encounter (Signed)
Patient's copay is $110.75 for 1 month supply for brand and generic Advair

## 2021-09-28 NOTE — Telephone Encounter (Signed)
Called and spoke with pt about the info stated by both pharmacy team and [facility]. When stated to pt that it was recommended for her to apply for pt assistance for the Trelegy to see if she would get approved, pt was hesitant and did not know if she wanted to try to apply as she said that her husband still works and she did not think that she would be approved.  When stated to her the info about the Advair, she said she would be fine with that route.  Beth, please advise on this.

## 2021-09-28 NOTE — Assessment & Plan Note (Signed)
-   Continue generic nasal steroid spray

## 2021-09-28 NOTE — Telephone Encounter (Signed)
What's her copay for Advair?

## 2021-09-28 NOTE — Assessment & Plan Note (Signed)
-   Continues to smoke 1ppd. Encourage she taper amount, goal 3/4 pack. She is due for LDCT in November with lung cancer screening program.

## 2021-09-28 NOTE — Telephone Encounter (Signed)
Can we please let patient know Trelegy is 47 dollars after she meets her deductible. She has 63 dollars left of pharmacy deductible. This starts over in January however. Recommend she apply for patient assistance for trelegy with [employer]. We can provider her with sample if needed

## 2021-09-28 NOTE — Assessment & Plan Note (Signed)
-   Stable; No significant respiratory complaints except for cough. Rare dyspnea. Not currently using Trelegy d/t cost. Using Albuterol nebulizer once daily. We will look into more affordable options for maintenance inhaler, she may do fine will just Advair. Advised she start mucinex-dm twice daily. Due for influenza vaccine today.

## 2021-09-28 NOTE — Telephone Encounter (Signed)
Ran test claim, 1 month supply of Trelegy is $110.75, Judithann Sauger has the same copay. This includes patient's $47.00 copay plus her unmet $63.75 pharmacy deductible. Her deductible will apply to all copays until it has been met and will start over January 1.  Patient should apply for patient assistance. [employer]- Trelegy requires patients to provide proof their household has spent $600 out of pocket for the calendar year.  [employer]- [place] patient assistance does not require the out of pocket spending.Marland Kitchen

## 2021-09-28 NOTE — Patient Instructions (Addendum)
Recommendations: - We will look into more affordable option to replace Trelegy  - Continue Albuterol nebulizer every 6 hours as needed for shortness of breath/wheezing - Start Mucinex DM- take 1 tablet twice daily for chest congestion - Continue nasal spray as directed  - Goal cut down 3/4 pack cigarettes a day  - We do not need a CXR today, you will be getting a CT chest in November with our lung cancer screening program   Office treatment: High dose influenza shot today   Follow-up: 6 months with Dr. Delton Coombes or sooner if needed     Steps to Quit Smoking Smoking tobacco is the leading cause of preventable death. It can affect almost every organ in the body. Smoking puts you and those around you at risk for developing many serious chronic diseases. Quitting smoking can be difficult, but it is one of the best things that you can do for your health. It is never too late to quit. How do I get ready to quit? When you decide to quit smoking, create a plan to help you succeed. Before you quit: Pick a date to quit. Set a date within the next 2 weeks to give you time to prepare. Write down the reasons why you are quitting. Keep this list in places where you will see it often. Tell your family, friends, and co-workers that you are quitting. Support from your loved ones can make quitting easier. Talk with your health care provider about your options for quitting smoking. Find out what treatment options are covered by your health insurance. Identify people, places, things, and activities that make you want to smoke (triggers). Avoid them. What first steps can I take to quit smoking? Throw away all cigarettes at home, at work, and in your car. Throw away smoking accessories, such as Set designer. Clean your car. Make sure to empty the ashtray. Clean your home, including curtains and carpets. What strategies can I use to quit smoking? Talk with your health care provider about combining  strategies, such as taking medicines while you are also receiving in-person counseling. Using these two strategies together makes you more likely to succeed in quitting than if you used either strategy on its own. If you are pregnant or breastfeeding, talk with your health care provider about finding counseling or other support strategies to quit smoking. Do not take medicine to help you quit smoking unless your health care provider tells you to do so. To quit smoking: Quit right away Quit smoking completely, instead of gradually reducing how much you smoke over a period of time. Research shows that stopping smoking right away is more successful than gradually quitting. Attend in-person counseling to help you build problem-solving skills. You are more likely to succeed in quitting if you attend counseling sessions regularly. Even short sessions of 10 minutes can be effective. Take medicine You may take medicines to help you quit smoking. Some medicines require a prescription and some you can purchase over-the-counter. Medicines may have nicotine in them to replace the nicotine in cigarettes. Medicines may: Help to stop cravings. Help to relieve withdrawal symptoms. Your health care provider may recommend: Nicotine patches, gum, or lozenges. Nicotine inhalers or sprays. Non-nicotine medicine that is taken by mouth. Find resources Find resources and support systems that can help you to quit smoking and remain smoke-free after you quit. These resources are most helpful when you use them often. They include: Online chats with a Veterinary surgeon. Telephone quitlines. Printed Materials engineer. Support  groups or group counseling. Text messaging programs. Mobile phone apps or applications. Use apps that can help you stick to your quit plan by providing reminders, tips, and encouragement. There are many free apps for mobile devices as well as websites. Examples include Quit Guide from the [employer] and  smokefree.gov What things can I do to make it easier to quit?  Reach out to your family and friends for support and encouragement. Call telephone quitlines (1-800-QUIT-NOW), reach out to support groups, or work with a counselor for support. Ask people who smoke to avoid smoking around you. Avoid places that trigger you to smoke, such as bars, parties, or smoke-break areas at work. Spend time with people who do not smoke. Lessen the stress in your life. Stress can be a smoking trigger for some people. To lessen stress, try: Exercising regularly. Doing deep-breathing exercises. Doing yoga. Meditating. Performing a body scan. This involves closing your eyes, scanning your body from head to toe, and noticing which parts of your body are particularly tense. Try to relax the muscles in those areas. How will I feel when I quit smoking? Day 1 to 3 weeks Within the first 24 hours of quitting smoking, you may start to feel withdrawal symptoms. These symptoms are usually most noticeable 2-3 days after quitting, but they usually do not last for more than 2-3 weeks. You may experience these symptoms: Mood swings. Restlessness, anxiety, or irritability. Trouble concentrating. Dizziness. Strong cravings for sugary foods and nicotine. Mild weight gain. Constipation. Nausea. Coughing or a sore throat. Changes in how the medicines that you take for unrelated issues work in your body. Depression. Trouble sleeping (insomnia). Week 3 and afterward After the first 2-3 weeks of quitting, you may start to notice more positive results, such as: Improved sense of smell and taste. Decreased coughing and sore throat. Slower heart rate. Lower blood pressure. Clearer skin. The ability to breathe more easily. Fewer sick days. Quitting smoking can be very challenging. Do not get discouraged if you are not successful the first time. Some people need to make many attempts to quit before they achieve long-term  success. Do your best to stick to your quit plan, and talk with your health care provider if you have any questions or concerns. Summary Smoking tobacco is the leading cause of preventable death. Quitting smoking is one of the best things that you can do for your health. When you decide to quit smoking, create a plan to help you succeed. Quit smoking right away, not slowly over a period of time. When you start quitting, seek help from your health care provider, family, or friends. This information is not intended to replace advice given to you by your health care provider. Make sure you discuss any questions you have with your health care provider. Document Revised: 09/07/2019 Document Reviewed: 03/03/2019 Elsevier Patient Education  2022 [employer].

## 2021-09-28 NOTE — Progress Notes (Signed)
_0  ID: Jeanne Becker, female    DOB: [birth year], 65 y.o.   MRN: [id number]  Chief Complaint  Patient presents with   Follow-up    Patient reports that she is having some left ear pain and some congestion.     Referring provider: Loman Brooklyn, FNP  HPI: 65 year old female, current everyday smoker.  Past medical history significant for COPD, chronic allergic rhinitis, aortic arthrosclerosis, scoliosis, hyperlipidemia.  Patient of Dr. Lamonte Sakai, last seen in office on 02/27/2021.    09/28/2021- Interim hx  Patient presents today for regular 6 month follow-up. During her last office visit with Dr. Lamonte Sakai she was asked to resume Trelegy Ellipta inhaler.  She saw [place] on 09/11/21 for cough and pharyngitis, rapid strep was negative. She was treated with Zpack. She is doing some better, still having congestion and left ear pain. She has a productive cough with clear mucus. She is using over the counter nasal spray. Most of the time her breathing is alright. She has been using nebulizer once a day. She has no problem walking up hills or inclines. She is not limited doing activities at home. She can not afford her Trelegy prescription. She is still smoking 1 pack per day. She did not tolerate NRT in the past d/t nausea.      Allergies  Allergen Reactions   Other    Codeine Rash   Ivp Dye [Iodinated Diagnostic Agents] Other (See Comments)   Lexapro [Escitalopram] Nausea And Vomiting   Penicillins Other (See Comments)    Intolerance     Immunization History  Administered Date(s) Administered   DTaP 01/28/2014   Influenza Whole 10/07/2012   Influenza, High Dose Seasonal PF 11/11/2016, 09/15/2017   Influenza,inj,Quad PF,6+ Mos 11/06/2012, 08/31/2013, 01/08/2015, 02/19/2016, 09/14/2018, 09/21/2019   Influenza-Unspecified 11/06/2012, 02/19/2016, 11/11/2016, 09/15/2017   MMR 10/20/2001   Moderna Sars-Covid-2 Vaccination 09/22/2020, 10/20/2020   Pneumococcal Conjugate-13  07/17/2014   Pneumococcal Polysaccharide-23 08/31/2013   Tdap 01/29/2014    Past Medical History:  Diagnosis Date   Allergic rhinitis, cause unspecified    Anxiety state, unspecified    Chronic airway obstruction, not elsewhere classified    Depressive disorder, not elsewhere classified    Essential hypertension, benign    Fatigue    H/O diabetes mellitus    Hyperlipidemia    Lower back pain    Menopause    Osteopenia 06/25/2021   Panic disorder without agoraphobia    Seborrheic keratosis    Unspecified arthropathy, shoulder region     Tobacco History: Social History   Tobacco Use  Smoking Status Every Day   Packs/day: 1.50   Years: 30.00   Pack years: 45.00   Types: Cigarettes   Start date: 1974  Smokeless Tobacco Never  Tobacco Comments   currently smoking 1ppd as of 02/27/21 ep   Ready to quit: Not Answered Counseling given: Not Answered Tobacco comments: currently smoking 1ppd as of 02/27/21 ep   Outpatient Medications Prior to Visit  Medication Sig Dispense Refill   acetaminophen (TYLENOL) 325 MG tablet Take 650 mg by mouth every 6 (six) hours as needed.     albuterol (VENTOLIN HFA) 108 (90 Base) MCG/ACT inhaler Inhale 2 puffs into the lungs every 4 (four) hours as needed for wheezing or shortness of breath. 8.5 each 4   aspirin 81 MG EC tablet Take 1 tablet (81 mg total) by mouth daily. Swallow whole. 30 tablet 12   celecoxib (CELEBREX) 100 MG capsule Take  1 capsule (100 mg total) by mouth 2 (two) times daily. 60 capsule 2   fluticasone (FLONASE) 50 MCG/ACT nasal spray SPRAY 2 SPRAYS INTO EACH NOSTRIL EVERY DAY 48 mL 0   Fluticasone-Umeclidin-Vilant (TRELEGY ELLIPTA) 100-62.5-25 MCG/INH AEPB Inhale 1 Inhaler into the lungs daily. 1 each 11   rosuvastatin (CRESTOR) 10 MG tablet TAKE 1 TABLET BY MOUTH EVERY DAY 90 tablet 0   albuterol (PROVENTIL) (2.5 MG/3ML) 0.083% nebulizer solution Take 3 mLs (2.5 mg total) by nebulization every 4 (four) hours as needed for  wheezing or shortness of breath. 450 mL 3   azithromycin (ZITHROMAX) 250 MG tablet Take 2 tablets today, then 1 tablet daily until gone. (Patient not taking: Reported on 09/28/2021) 6 tablet 0   benzonatate (TESSALON) 100 MG capsule Take 2 capsules (200 mg total) by mouth every 6 (six) hours as needed for cough. (Patient not taking: Reported on 09/28/2021) 30 capsule 1   ipratropium (ATROVENT) 0.06 % nasal spray Place 2 sprays into both nostrils 3 (three) times daily. (Patient not taking: Reported on 09/28/2021)     predniSONE (DELTASONE) 5 MG tablet Take by mouth. (Patient not taking: Reported on 09/28/2021)     No facility-administered medications prior to visit.    Review of Systems  Review of Systems  Constitutional: Negative.   HENT:  Positive for congestion and ear pain.   Respiratory:  Positive for cough. Negative for chest tightness, shortness of breath and wheezing.     Physical Exam  BP 118/78 (BP Location: Left Arm, Patient Position: Sitting, Cuff Size: Normal)   Pulse 91   Temp 98.2 F (36.8 C) (Oral)   Ht _0  (1.575 m)   Wt 100 lb 3.2 oz (45.5 kg)   SpO2 99%   BMI 18.33 kg/m  Physical Exam Constitutional:      Appearance: Normal appearance.  HENT:     Head: Normocephalic and atraumatic.     Right Ear: Tympanic membrane and ear canal normal. There is impacted cerumen.     Left Ear: Tympanic membrane and ear canal normal. There is no impacted cerumen.     Mouth/Throat:     Mouth: Mucous membranes are moist.     Pharynx: Oropharynx is clear.  Cardiovascular:     Rate and Rhythm: Normal rate and regular rhythm.  Pulmonary:     Effort: Pulmonary effort is normal.     Breath sounds: Normal breath sounds. No wheezing, rhonchi or rales.  Musculoskeletal:        General: Normal range of motion.  Skin:    General: Skin is warm and dry.  Neurological:     General: No focal deficit present.     Mental Status: She is alert and oriented to person, place, and time. Mental  status is at baseline.  Psychiatric:        Mood and Affect: Mood normal.        Behavior: Behavior normal.        Thought Content: Thought content normal.        Judgment: Judgment normal.     Lab Results:  CBC    Component Value Date/Time   WBC 5.5 05/15/2021 1539   WBC 5.8 12/09/2016 0931   RBC 4.31 05/15/2021 1539   RBC 4.31 12/09/2016 0931   HGB 15.1 05/15/2021 1539   HCT 43.5 05/15/2021 1539   PLT 187 05/15/2021 1539   MCV 101 (H) 05/15/2021 1539   MCH 35.0 (H) 05/15/2021 1539   MCHC 34.7  05/15/2021 1539   MCHC 34.3 12/09/2016 0931   RDW 11.5 (L) 05/15/2021 1539   LYMPHSABS 1.1 05/15/2021 1539   MONOABS 0.7 12/09/2016 0931   EOSABS 0.0 05/15/2021 1539   BASOSABS 0.0 05/15/2021 1539    BMET    Component Value Date/Time   NA 142 05/15/2021 1539   K 3.8 05/15/2021 1539   CL 102 05/15/2021 1539   CO2 24 05/15/2021 1539   GLUCOSE 116 (H) 05/15/2021 1539   BUN 9 05/15/2021 1539   CREATININE 0.92 05/15/2021 1539   CALCIUM 9.1 05/15/2021 1539   GFRNONAA 74 03/19/2020 1225   GFRAA 85 03/19/2020 1225    BNP No results found for: BNP  ProBNP No results found for: PROBNP  Imaging: No results found.   Assessment & Plan:   COPD, moderately severe - Stable; No significant respiratory complaints except for cough. Rare dyspnea. Not currently using Trelegy d/t cost. Using Albuterol nebulizer once daily. We will look into more affordable options for maintenance inhaler, she may do fine will just Advair. Advised she start mucinex-dm twice daily. Due for influenza vaccine today.   Chronic allergic rhinitis - Continue generic nasal steroid spray   Current smoker - Continues to smoke 1ppd. Encourage she taper amount, goal 3/4 pack. She is due for LDCT in November with lung cancer screening program.    Martyn Ehrich, NP 09/28/2021

## 2021-09-28 NOTE — Telephone Encounter (Signed)
What is a cheaper alternative to Trelegy 100 for this patient? Prescription is costing her 147 dollars and she is not compliant. Thanks

## 2021-09-28 NOTE — Telephone Encounter (Signed)
Please discontinue Trelegy and send in Advair disk 250-2mcg one puff twice daily.

## 2021-09-28 NOTE — Telephone Encounter (Signed)
Called and spoke with pt letting her know that we were going to discontinue the Trelegy and send in Advair to the pharmacy for her and she verbalized understanding. Verified preferred pharmacy and sent Rx in for pt. Nothing further needed.

## 2021-10-19 DIAGNOSIS — J441 Chronic obstructive pulmonary disease with (acute) exacerbation: Secondary | ICD-10-CM | POA: Diagnosis not present

## 2021-10-19 DIAGNOSIS — J029 Acute pharyngitis, unspecified: Secondary | ICD-10-CM | POA: Diagnosis not present

## 2021-10-19 DIAGNOSIS — Z681 Body mass index (BMI) 19 or less, adult: Secondary | ICD-10-CM | POA: Diagnosis not present

## 2021-10-27 ENCOUNTER — Ambulatory Visit: Payer: Medicare Other | Admitting: Family Medicine

## 2021-10-27 ENCOUNTER — Encounter: Payer: Self-pay | Admitting: Family Medicine

## 2021-10-28 ENCOUNTER — Other Ambulatory Visit: Payer: Self-pay | Admitting: Family Medicine

## 2021-10-28 DIAGNOSIS — I700 Atherosclerosis of aorta: Secondary | ICD-10-CM

## 2021-10-28 DIAGNOSIS — E782 Mixed hyperlipidemia: Secondary | ICD-10-CM

## 2021-11-10 ENCOUNTER — Other Ambulatory Visit: Payer: Self-pay

## 2021-11-10 DIAGNOSIS — Z87891 Personal history of nicotine dependence: Secondary | ICD-10-CM

## 2021-11-10 DIAGNOSIS — F17210 Nicotine dependence, cigarettes, uncomplicated: Secondary | ICD-10-CM

## 2021-11-11 ENCOUNTER — Other Ambulatory Visit: Payer: Self-pay | Admitting: Family Medicine

## 2021-11-11 DIAGNOSIS — I700 Atherosclerosis of aorta: Secondary | ICD-10-CM

## 2021-11-11 DIAGNOSIS — E782 Mixed hyperlipidemia: Secondary | ICD-10-CM

## 2021-11-18 ENCOUNTER — Encounter: Payer: Self-pay | Admitting: Family Medicine

## 2021-11-18 ENCOUNTER — Ambulatory Visit (INDEPENDENT_AMBULATORY_CARE_PROVIDER_SITE_OTHER): Payer: Medicare Other | Admitting: Family Medicine

## 2021-11-18 ENCOUNTER — Other Ambulatory Visit: Payer: Self-pay

## 2021-11-18 VITALS — BP 134/76 | HR 112 | Temp 97.9°F | Ht 62.0 in | Wt 99.4 lb

## 2021-11-18 DIAGNOSIS — B379 Candidiasis, unspecified: Secondary | ICD-10-CM

## 2021-11-18 DIAGNOSIS — I700 Atherosclerosis of aorta: Secondary | ICD-10-CM

## 2021-11-18 DIAGNOSIS — J449 Chronic obstructive pulmonary disease, unspecified: Secondary | ICD-10-CM | POA: Diagnosis not present

## 2021-11-18 DIAGNOSIS — N898 Other specified noninflammatory disorders of vagina: Secondary | ICD-10-CM

## 2021-11-18 DIAGNOSIS — H9202 Otalgia, left ear: Secondary | ICD-10-CM

## 2021-11-18 DIAGNOSIS — T3695XA Adverse effect of unspecified systemic antibiotic, initial encounter: Secondary | ICD-10-CM

## 2021-11-18 DIAGNOSIS — I2510 Atherosclerotic heart disease of native coronary artery without angina pectoris: Secondary | ICD-10-CM | POA: Diagnosis not present

## 2021-11-18 DIAGNOSIS — E782 Mixed hyperlipidemia: Secondary | ICD-10-CM | POA: Diagnosis not present

## 2021-11-18 DIAGNOSIS — M1990 Unspecified osteoarthritis, unspecified site: Secondary | ICD-10-CM

## 2021-11-18 DIAGNOSIS — M8580 Other specified disorders of bone density and structure, unspecified site: Secondary | ICD-10-CM

## 2021-11-18 LAB — WET PREP FOR TRICH, YEAST, CLUE
Clue Cell Exam: NEGATIVE
Trichomonas Exam: NEGATIVE

## 2021-11-18 MED ORDER — FLUCONAZOLE 150 MG PO TABS: 150.0000 mg | ORAL_TABLET | Freq: Once | ORAL | 0 refills | Status: AC

## 2021-11-18 NOTE — Progress Notes (Signed)
Assessment & Plan:  1. Mixed hyperlipidemia Well controlled on current regimen.   2. Aortic atherosclerosis (HCC) Continue statin and aspirin.  3. Atherosclerosis of native coronary artery of native heart without angina pectoris Continue statin and aspirin.  4. Chronic obstructive pulmonary disease, unspecified COPD type ([place]) Managed by pulmonology.   5. Arthritis Managed with Tylenol.  6. Osteopenia, unspecified location Continue calcium and vitamin D supplements. Education provided on osteopenia.  7. Left ear pain - Ambulatory referral to ENT  8. Vaginal discharge Wet prep + yeast  9. Antibiotic-induced yeast infection Treated with Diflucan.   Return in about 6 months (around 05/18/2022) for annual physical.  Hendricks Limes, MSN, APRN, FNP-C [facility]  Subjective:    Patient ID: Jeanne Becker, female    DOB: [birth year], 65 y.o.   MRN: [id number]  Patient Care Team: Loman Brooklyn, FNP as PCP - General (Family Medicine) Collene Gobble, MD as Consulting Physician (Pulmonary Disease) Harlen Labs, MD as Referring Physician (Optometry)   Chief Complaint:  Chief Complaint  Patient presents with   Hyperlipidemia   COPD    4 month follow up of chronic medical conditions    Ear Pain    Left x 2 months- wants referral ENT     HPI: NIANA MARTORANA is a 65 y.o. female presenting on 11/18/2021 for Hyperlipidemia, COPD (4 month follow up of chronic medical conditions ), and Ear Pain (Left x 2 months- wants referral ENT )  Hyperlipidemia/Aortic atherosclerosis/Coronary artery atherosclerosis: patient is tolerating rosuvastatin daily. She does continue to smoke.  COPD: Managed by her pulmonologist. She has Advair for daily use and Albuterol for as needed use. She reports she has her yearly lung CT scheduled for next month.  Arthritis: previously controlled with Celebrex, which she is no longer taking as she states she didn't think it  was helping. She is taking extra strength Tylenol, which is helpful.  Osteopenia: last DEXA 06/26/2021 showed osteopenia. She is taking calcium and vitamin D supplements.   New complaints: Patient reports she has been to urgent care twice due to left ear pain and feeling stopped up. She would like a referral to ENT, Dr. Benjamine Mola for further evaluation.   Patient reports white vaginal discharge since taking antibiotics and steroids.   Social history:  Relevant past medical, surgical, family and social history reviewed and updated as indicated. Interim medical history since our last visit reviewed.  Allergies and medications reviewed and updated.  DATA REVIEWED: CHART IN EPIC  ROS: Negative unless specifically indicated above in HPI.    Current Outpatient Medications:    acetaminophen (TYLENOL) 325 MG tablet, Take 650 mg by mouth every 6 (six) hours as needed., Disp: , Rfl:    albuterol (PROVENTIL) (2.5 MG/3ML) 0.083% nebulizer solution, Take 3 mLs (2.5 mg total) by nebulization every 4 (four) hours as needed for wheezing or shortness of breath., Disp: 450 mL, Rfl: 3   albuterol (VENTOLIN HFA) 108 (90 Base) MCG/ACT inhaler, Inhale 2 puffs into the lungs every 4 (four) hours as needed for wheezing or shortness of breath., Disp: 8.5 each, Rfl: 4   aspirin 81 MG EC tablet, Take 1 tablet (81 mg total) by mouth daily. Swallow whole., Disp: 30 tablet, Rfl: 12   fluticasone (FLONASE) 50 MCG/ACT nasal spray, SPRAY 2 SPRAYS INTO EACH NOSTRIL EVERY DAY, Disp: 48 mL, Rfl: 0   fluticasone-salmeterol (ADVAIR) 250-50 MCG/ACT AEPB, Inhale 1 puff into the lungs every 12 (twelve)  hours., Disp: 60 each, Rfl: 11   rosuvastatin (CRESTOR) 10 MG tablet, Take 1 tablet (10 mg total) by mouth daily., Disp: 90 tablet, Rfl: 0   celecoxib (CELEBREX) 100 MG capsule, Take 1 capsule (100 mg total) by mouth 2 (two) times daily. (Patient not taking: Reported on 11/18/2021), Disp: 60 capsule, Rfl: 2   Allergies  Allergen  Reactions   Other    Codeine Rash   Ivp Dye [Iodinated Diagnostic Agents] Other (See Comments)   Lexapro [Escitalopram] Nausea And Vomiting   Penicillins Other (See Comments)    Intolerance    Past Medical History:  Diagnosis Date   Allergic rhinitis, cause unspecified    Anxiety state, unspecified    Chronic airway obstruction, not elsewhere classified    Depressive disorder, not elsewhere classified    Essential hypertension, benign    Fatigue    H/O diabetes mellitus    Hyperlipidemia    Lower back pain    Menopause    Osteopenia 06/25/2021   Panic disorder without agoraphobia    Seborrheic keratosis    Unspecified arthropathy, shoulder region     Past Surgical History:  Procedure Laterality Date   APPENDECTOMY     BREAST BIOPSY  12/2012   benign   CESAREAN SECTION     TONSILLECTOMY     TUBAL LIGATION      Social History   Socioeconomic History   Marital status: Married    Spouse name: Not on file   Number of children: 2   Years of education: Not on file   Highest education level: Not on file  Occupational History   Not on file  Tobacco Use   Smoking status: Every Day    Packs/day: 1.50    Years: 30.00    Pack years: 45.00    Types: Cigarettes    Start date: 1974   Smokeless tobacco: Never   Tobacco comments:    currently smoking 1ppd as of 02/27/21 ep  Vaping Use   Vaping Use: Never used  Substance and Sexual Activity   Alcohol use: Not Currently    Alcohol/week: 12.0 standard drinks    Types: 12 Cans of beer per week    Comment: 2 drinks/day or fewer   Drug use: Not Currently    Types: Marijuana   Sexual activity: Yes    Partners: Male    Birth control/protection: Surgical  Other Topics Concern   Not on file  Social History Narrative   Originally from [place]. Always lived in [place]. 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 Also worked in [employer] She has a dog currently. Remote exposure to love birds. No mold exposure.  Husband and granddaughter live with her. She has 2 sons.   Social Determinants of Health   Financial Resource Strain: Low Risk    Difficulty of Paying Living Expenses: Not very hard  Food Insecurity: No Food Insecurity   Worried About Charity fundraiser in the Last Year: Never true   Ran Out of Food in the Last Year: Never true  Transportation Needs: No Transportation Needs   Lack of Transportation (Medical): No   Lack of Transportation (Non-Medical): No  Physical Activity: Insufficiently Active   Days of Exercise per Week: 7 days   Minutes of Exercise per Session: 10 min  Stress: Stress Concern Present   Feeling of Stress : Rather much  Social Connections: Moderately Isolated   Frequency of Communication with Friends and Family: More than three  times a week   Frequency of Social Gatherings with Friends and Family: Once a week   Attends Religious Services: Never   Marine scientist or Organizations: No   Attends Music therapist: Never   Marital Status: Married  Human resources officer Violence: Not At Risk   Fear of Current or Ex-Partner: No   Emotionally Abused: No   Physically Abused: No   Sexually Abused: No        Objective:    BP 134/76   Pulse (!) 112   Temp 97.9 F (36.6 C) (Temporal)   Ht '5\' 2"'  (1.575 m)   Wt 99 lb 6.4 oz (45.1 kg)   SpO2 99%   BMI 18.18 kg/m   Wt Readings from Last 3 Encounters:  11/18/21 99 lb 6.4 oz (45.1 kg)  09/28/21 100 lb 3.2 oz (45.5 kg)  06/25/21 99 lb 6.4 oz (45.1 kg)    Physical Exam Vitals reviewed.  Constitutional:      General: She is not in acute distress.    Appearance: Normal appearance. She is underweight. She is not ill-appearing, toxic-appearing or diaphoretic.  HENT:     Head: Normocephalic and atraumatic.     Left Ear: Ear canal and external ear normal. There is no impacted cerumen.  Eyes:     General: No scleral icterus.       Right eye: No discharge.        Left eye: No discharge.      Conjunctiva/sclera: Conjunctivae normal.  Cardiovascular:     Rate and Rhythm: Normal rate and regular rhythm.     Heart sounds: Normal heart sounds. No murmur heard.   No friction rub. No gallop.  Pulmonary:     Effort: Pulmonary effort is normal. No respiratory distress.     Breath sounds: Normal breath sounds. No stridor. No wheezing, rhonchi or rales.  Musculoskeletal:        General: Normal range of motion.     Cervical back: Normal range of motion.  Skin:    General: Skin is warm and dry.     Capillary Refill: Capillary refill takes less than 2 seconds.  Neurological:     General: No focal deficit present.     Mental Status: She is alert and oriented to person, place, and time. Mental status is at baseline.  Psychiatric:        Mood and Affect: Mood normal.        Behavior: Behavior normal.        Thought Content: Thought content normal.        Judgment: Judgment normal.    No results found for: TSH Lab Results  Component Value Date   WBC 5.5 05/15/2021   HGB 15.1 05/15/2021   HCT 43.5 05/15/2021   MCV 101 (H) 05/15/2021   PLT 187 05/15/2021   Lab Results  Component Value Date   NA 142 05/15/2021   K 3.8 05/15/2021   CO2 24 05/15/2021   GLUCOSE 116 (H) 05/15/2021   BUN 9 05/15/2021   CREATININE 0.92 05/15/2021   BILITOT 0.3 05/15/2021   ALKPHOS 64 05/15/2021   AST 20 05/15/2021   ALT 15 05/15/2021   PROT 6.1 05/15/2021   ALBUMIN 4.4 05/15/2021   CALCIUM 9.1 05/15/2021   EGFR 69 05/15/2021   Lab Results  Component Value Date   CHOL 156 05/15/2021   Lab Results  Component Value Date   HDL 51 05/15/2021   Lab Results  Component  Value Date   LDLCALC 80 05/15/2021   Lab Results  Component Value Date   TRIG 142 05/15/2021   Lab Results  Component Value Date   CHOLHDL 3.1 05/15/2021   Lab Results  Component Value Date   HGBA1C 5.3 03/19/2020

## 2021-12-01 ENCOUNTER — Encounter: Payer: Self-pay | Admitting: Family Medicine

## 2021-12-01 ENCOUNTER — Ambulatory Visit (INDEPENDENT_AMBULATORY_CARE_PROVIDER_SITE_OTHER): Payer: Medicare Other | Admitting: Family Medicine

## 2021-12-01 ENCOUNTER — Telehealth: Payer: Medicare Other | Admitting: Family Medicine

## 2021-12-01 DIAGNOSIS — J0140 Acute pansinusitis, unspecified: Secondary | ICD-10-CM | POA: Diagnosis not present

## 2021-12-01 DIAGNOSIS — Z8619 Personal history of other infectious and parasitic diseases: Secondary | ICD-10-CM

## 2021-12-01 MED ORDER — FLUCONAZOLE 150 MG PO TABS: ORAL_TABLET | ORAL | 0 refills | Status: DC

## 2021-12-01 MED ORDER — DOXYCYCLINE HYCLATE 100 MG PO TABS: 100.0000 mg | ORAL_TABLET | Freq: Two times a day (BID) | ORAL | 0 refills | Status: AC

## 2021-12-01 NOTE — Progress Notes (Signed)
Virtual Visit via telephone Note Due to COVID-19 pandemic this visit was conducted virtually. This visit type was conducted due to national recommendations for restrictions regarding the COVID-19 Pandemic (e.g. social distancing, sheltering in place) in an effort to limit this patient's exposure and mitigate transmission in our community. All issues noted in this document were discussed and addressed.  A physical exam was not performed with this format.   I connected with Jeanne Becker on 12/01/2021 at 1250 by telephone and verified that I am speaking with the correct person using two identifiers. Jeanne Becker is currently located at home and patient is currently with them during visit. The provider, Kari Baars, FNP is located in their office at time of visit.  I discussed the limitations, risks, security and privacy concerns of performing an evaluation and management service by telephone and the availability of in person appointments. I also discussed with the patient that there may be a patient responsible charge related to this service. The patient expressed understanding and agreed to proceed.  Subjective:  Patient ID: Jeanne Becker, female    DOB: [birth year], 65 y.o.   MRN: [id number]  Chief Complaint:  Sinusitis   HPI: Jeanne Becker is a 65 y.o. female presenting on 12/01/2021 for Sinusitis   Pt reports ongoing and worsening frontal and maxillary sinus pressure with otalgia. She has been using Mucinex and Flonase for at least 2 weeks without resolution of symptoms.   Sinusitis This is a recurrent problem. The current episode started 1 to 4 weeks ago. The problem has been gradually worsening since onset. Her pain is at a severity of 5/10. The pain is moderate. Associated symptoms include chills, congestion, coughing, ear pain, headaches, shortness of breath (baseline) and sinus pressure. Pertinent negatives include no diaphoresis, hoarse voice, neck pain, sneezing, sore  throat or swollen glands. Past treatments include oral decongestants and spray decongestants. The treatment provided no relief.    Relevant past medical, surgical, family, and social history reviewed and updated as indicated.  Allergies and medications reviewed and updated.   Past Medical History:  Diagnosis Date   Allergic rhinitis, cause unspecified    Anxiety state, unspecified    Chronic airway obstruction, not elsewhere classified    Depressive disorder, not elsewhere classified    Essential hypertension, benign    Fatigue    H/O diabetes mellitus    Hyperlipidemia    Lower back pain    Menopause    Osteopenia 06/25/2021   Panic disorder without agoraphobia    Seborrheic keratosis    Unspecified arthropathy, shoulder region     Past Surgical History:  Procedure Laterality Date   APPENDECTOMY     BREAST BIOPSY  12/2012   benign   CESAREAN SECTION     TONSILLECTOMY     TUBAL LIGATION      Social History   Socioeconomic History   Marital status: Married    Spouse name: Not on file   Number of children: 2   Years of education: Not on file   Highest education level: Not on file  Occupational History   Not on file  Tobacco Use   Smoking status: Every Day    Packs/day: 1.50    Years: 30.00    Pack years: 45.00    Types: Cigarettes    Start date: 1974   Smokeless tobacco: Never   Tobacco comments:    currently smoking 1ppd as of 02/27/21 ep  Vaping Use   Vaping  Use: Never used  Substance and Sexual Activity   Alcohol use: Not Currently    Alcohol/week: 12.0 standard drinks    Types: 12 Cans of beer per week    Comment: 2 drinks/day or fewer   Drug use: Not Currently    Types: Marijuana   Sexual activity: Yes    Partners: Male    Birth control/protection: Surgical  Other Topics Concern   Not on file  Social History Narrative   Originally from [place]. Always lived in [place]. 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 Also worked in [employer]. She has a dog currently. Remote exposure to love birds. No mold exposure. Husband and granddaughter live with her. She has 2 sons.   Social Determinants of Health   Financial Resource Strain: Low Risk    Difficulty of Paying Living Expenses: Not very hard  Food Insecurity: No Food Insecurity   Worried About Programme researcher, broadcasting/film/video in the Last Year: Never true   Ran Out of Food in the Last Year: Never true  Transportation Needs: No Transportation Needs   Lack of Transportation (Medical): No   Lack of Transportation (Non-Medical): No  Physical Activity: Insufficiently Active   Days of Exercise per Week: 7 days   Minutes of Exercise per Session: 10 min  Stress: Stress Concern Present   Feeling of Stress : Rather much  Social Connections: Moderately Isolated   Frequency of Communication with Friends and Family: More than three times a week   Frequency of Social Gatherings with Friends and Family: Once a week   Attends Religious Services: Never   Database administrator or Organizations: No   Attends Engineer, structural: Never   Marital Status: Married  Catering manager Violence: Not At Risk   Fear of Current or Ex-Partner: No   Emotionally Abused: No   Physically Abused: No   Sexually Abused: No    Outpatient Encounter Medications as of 12/01/2021  Medication Sig   doxycycline (VIBRA-TABS) 100 MG tablet Take 1 tablet (100 mg total) by mouth 2 (two) times daily for 10 days. 1 po bid   fluconazole (DIFLUCAN) 150 MG tablet 1 po q week x 4 weeks   acetaminophen (TYLENOL) 325 MG tablet Take 650 mg by mouth every 6 (six) hours as needed.   albuterol (PROVENTIL) (2.5 MG/3ML) 0.083% nebulizer solution Take 3 mLs (2.5 mg total) by nebulization every 4 (four) hours as needed for wheezing or shortness of breath.   albuterol (VENTOLIN HFA) 108 (90 Base) MCG/ACT inhaler Inhale 2 puffs into the lungs every 4 (four) hours as needed for wheezing or shortness of breath.   aspirin 81 MG  EC tablet Take 1 tablet (81 mg total) by mouth daily. Swallow whole.   CALCIUM PO Take by mouth daily.   fluticasone (FLONASE) 50 MCG/ACT nasal spray SPRAY 2 SPRAYS INTO EACH NOSTRIL EVERY DAY   fluticasone-salmeterol (ADVAIR) 250-50 MCG/ACT AEPB Inhale 1 puff into the lungs every 12 (twelve) hours.   rosuvastatin (CRESTOR) 10 MG tablet Take 1 tablet (10 mg total) by mouth daily.   VITAMIN D PO Take by mouth daily.   No facility-administered encounter medications on file as of 12/01/2021.    Allergies  Allergen Reactions   Other    Codeine Rash   Ivp Dye [Iodinated Diagnostic Agents] Other (See Comments)   Lexapro [Escitalopram] Nausea And Vomiting   Penicillins Other (See Comments)    Intolerance     Review of Systems  Constitutional:  Positive for activity change, appetite change, chills and fatigue. Negative for diaphoresis, fever and unexpected weight change.  HENT:  Positive for congestion, ear pain, postnasal drip, sinus pressure and sinus pain. Negative for dental problem, drooling, ear discharge, facial swelling, hearing loss, hoarse voice, mouth sores, rhinorrhea, sneezing, sore throat, tinnitus and voice change.   Respiratory:  Positive for cough and shortness of breath (baseline).   Cardiovascular:  Negative for chest pain, palpitations and leg swelling.  Gastrointestinal:  Negative for abdominal pain.  Genitourinary:  Negative for decreased urine volume.  Musculoskeletal:  Negative for neck pain.  Neurological:  Positive for headaches. Negative for dizziness, tremors, seizures, syncope, facial asymmetry, speech difficulty, weakness, light-headedness and numbness.  Psychiatric/Behavioral:  Negative for confusion.   All other systems reviewed and are negative.       Observations/Objective: No vital signs or physical exam, this was a telephone or virtual health encounter.  Pt alert and oriented, answers all questions appropriately, and able to speak in full sentences.     Assessment and Plan: Jeanne Becker was seen today for sinusitis.  Diagnoses and all orders for this visit:  Acute non-recurrent pansinusitis Ongoing sinusitis symptoms with failed conservative therapy at home. Will add doxycycline to current symptomatic care. Pt aware to report any new, worsening, or persistent symptoms. Follow up as needed.  -     doxycycline (VIBRA-TABS) 100 MG tablet; Take 1 tablet (100 mg total) by mouth 2 (two) times daily for 10 days. 1 po bid  History of candidiasis Pt aware of when to dose if symptoms develop due to antibiotic use.  -     fluconazole (DIFLUCAN) 150 MG tablet; 1 po q week x 4 weeks     Follow Up Instructions: Return if symptoms worsen or fail to improve.    I discussed the assessment and treatment plan with the patient. The patient was provided an opportunity to ask questions and all were answered. The patient agreed with the plan and demonstrated an understanding of the instructions.   The patient was advised to call back or seek an in-person evaluation if the symptoms worsen or if the condition fails to improve as anticipated.  The above assessment and management plan was discussed with the patient. The patient verbalized understanding of and has agreed to the management plan. Patient is aware to call the clinic if they develop any new symptoms or if symptoms persist or worsen. Patient is aware when to return to the clinic for a follow-up visit. Patient educated on when it is appropriate to go to the emergency department.    I provided 15 minutes of non-face-to-face time during this encounter. The call started at 1250. The call ended at 1303. The other time was used for coordination of care.    Kari Baars, FNP-C [facility] [address] [place], [place] [address] [phone] 12/01/2021

## 2021-12-17 ENCOUNTER — Ambulatory Visit: Payer: Medicare Other | Admitting: Family Medicine

## 2021-12-18 ENCOUNTER — Ambulatory Visit (HOSPITAL_COMMUNITY): Payer: Medicare Other

## 2022-02-03 DIAGNOSIS — R0982 Postnasal drip: Secondary | ICD-10-CM | POA: Diagnosis not present

## 2022-02-03 DIAGNOSIS — J343 Hypertrophy of nasal turbinates: Secondary | ICD-10-CM | POA: Diagnosis not present

## 2022-02-03 DIAGNOSIS — J310 Chronic rhinitis: Secondary | ICD-10-CM | POA: Diagnosis not present

## 2022-02-03 DIAGNOSIS — H9202 Otalgia, left ear: Secondary | ICD-10-CM | POA: Diagnosis not present

## 2022-02-03 DIAGNOSIS — F17210 Nicotine dependence, cigarettes, uncomplicated: Secondary | ICD-10-CM | POA: Diagnosis not present

## 2022-02-17 ENCOUNTER — Telehealth: Payer: Self-pay | Admitting: Nurse Practitioner

## 2022-02-17 ENCOUNTER — Other Ambulatory Visit: Payer: Self-pay

## 2022-02-17 ENCOUNTER — Ambulatory Visit: Payer: Medicare Other | Admitting: Nurse Practitioner

## 2022-02-17 ENCOUNTER — Other Ambulatory Visit (HOSPITAL_COMMUNITY): Payer: Self-pay

## 2022-02-17 ENCOUNTER — Encounter: Payer: Self-pay | Admitting: Nurse Practitioner

## 2022-02-17 VITALS — BP 116/70 | HR 93 | Temp 98.6°F | Ht 62.0 in | Wt 96.0 lb

## 2022-02-17 DIAGNOSIS — J440 Chronic obstructive pulmonary disease with acute lower respiratory infection: Secondary | ICD-10-CM | POA: Diagnosis not present

## 2022-02-17 DIAGNOSIS — J069 Acute upper respiratory infection, unspecified: Secondary | ICD-10-CM | POA: Diagnosis not present

## 2022-02-17 DIAGNOSIS — J209 Acute bronchitis, unspecified: Secondary | ICD-10-CM

## 2022-02-17 DIAGNOSIS — F17200 Nicotine dependence, unspecified, uncomplicated: Secondary | ICD-10-CM

## 2022-02-17 DIAGNOSIS — F17210 Nicotine dependence, cigarettes, uncomplicated: Secondary | ICD-10-CM | POA: Diagnosis not present

## 2022-02-17 MED ORDER — FLUTICASONE FUROATE-VILANTEROL 100-25 MCG/ACT IN AEPB
1.0000 | INHALATION_SPRAY | Freq: Every day | RESPIRATORY_TRACT | 5 refills | Status: DC
Start: 2022-02-17 — End: 2022-07-15

## 2022-02-17 MED ORDER — AZITHROMYCIN 250 MG PO TABS: ORAL_TABLET | ORAL | 0 refills | Status: DC

## 2022-02-17 MED ORDER — ALBUTEROL SULFATE (2.5 MG/3ML) 0.083% IN NEBU: 2.5000 mg | INHALATION_SOLUTION | RESPIRATORY_TRACT | 3 refills | Status: DC | PRN

## 2022-02-17 MED ORDER — AZELASTINE HCL 0.1 % NA SOLN: 2.0000 | Freq: Two times a day (BID) | NASAL | 2 refills | Status: DC

## 2022-02-17 NOTE — Assessment & Plan Note (Signed)
Acute flare likely r/t URI. Given length of symptoms, not appropriate for flu/COVID testing as she is out of the window for antiviral therapy. Prednisone taper and z pack. Discussed the importance of maintenance inhaler therapy - will reach out to pharmacy team to discuss more affordable options as Advair is >$100 a month.   Patient Instructions  -Continue Albuterol inhaler 2 puffs or 3 mL neb every 6 hours as needed for shortness of breath or wheezing. Notify if symptoms persist despite rescue inhaler/neb use. -Continue on Advair inhaler 1 puffs Twice daily. Brush tongue and rinse mouth afterwards. We will check with the pharmacy team and see if there are any other inhalers with better coverage. -Continue flonase nasal spray 1-2 sprays each nostril daily  -Saline nasal spray 2-3 times a day as needed for nasal congestion/postnasal drip until symptoms improve -Mucinex DM 600 mg twice daily as needed until symptoms improve -Chlortab 4mg  over the counter at night as needed for cough -Prednisone taper. 4 tabs for 2 days, then 3 tabs for 2 days, 2 tabs for 2 days, then 1 tab for 2 days, then stop. Take in AM with food.  -Astelin nasal sprays 2 puffs Twice daily  -Z pack. Take 2 tabs on day one followed by 1 tab for four additional days  Follow up in a month with Dr. Lamonte Sakai or Alanson Aly. If symptoms do not improve or worsen, please contact office for sooner follow up or seek emergency care.

## 2022-02-17 NOTE — Patient Instructions (Addendum)
-  Continue Albuterol inhaler 2 puffs or 3 mL neb every 6 hours as needed for shortness of breath or wheezing. Notify if symptoms persist despite rescue inhaler/neb use. -Continue on Advair inhaler 1 puffs Twice daily. Brush tongue and rinse mouth afterwards. We will check with the pharmacy team and see if there are any other inhalers with better coverage. -Continue flonase nasal spray 1-2 sprays each nostril daily  -Saline nasal spray 2-3 times a day as needed for nasal congestion/postnasal drip until symptoms improve -Mucinex DM 600 mg twice daily as needed until symptoms improve -Chlortab 4mg  over the counter at night as needed for cough -Prednisone taper. 4 tabs for 2 days, then 3 tabs for 2 days, 2 tabs for 2 days, then 1 tab for 2 days, then stop. Take in AM with food.  -Astelin nasal sprays 2 puffs Twice daily  -Z pack. Take 2 tabs on day one followed by 1 tab for four additional days  Follow up in a month with Dr. or Delton Coombes. If symptoms do not improve or worsen, please contact office for sooner follow up or seek emergency care.

## 2022-02-17 NOTE — Telephone Encounter (Signed)
RX for Breo 1 puff daily sent to pharmacy on file. Advise patient of this change and that this should be more affordable than Advair. Let us know how she does on this. Thanks!

## 2022-02-17 NOTE — Telephone Encounter (Signed)
The patient wants to know if something that is comparable to Advair would be cheaper for her to fill. Please advise.

## 2022-02-17 NOTE — Assessment & Plan Note (Signed)
URI vs chronic allergic rhinitis. Initiated cough and postnasal drip control regimens. Advised to follow up if symptoms do not improve.

## 2022-02-17 NOTE — Progress Notes (Signed)
_0  ID: Jeanne Becker, female    DOB: [birth year], 66 y.o.   MRN: [id number]  Chief Complaint  Patient presents with   Follow-up    She reports she is having some nasal congestion, and tightness in her chest. She has some clear thick sputum that comes up at times.     Referring provider: Loman Brooklyn, FNP  HPI: 66 year old female, current every day smoker followed for COPD and chronic allergic rhinitis. She is a patient of Jeanne Becker and last seen in office on 09/28/2021 by Jeanne Becker. Past medical history significant for aortic atherosclerosis, scoliosis, HLD.  TEST/EVENTS: 09/14/2016 PFTs: FVC 1.99 (66), FEV1 1.43 (61), ratio 72, TLC 103%, DLCOcor 68%. No BD 03/15/2017 PFTs: FVC 2.17 (73), FEV1 1.55 (67), ratio 71. No BD 11/25/2020 LDCT chest: atherosclerosis. Mild centrilobular and paraseptal emphysema. B/l pulm nodules, largest solid 4 mm. Considered Lung RADS 2 with benign appearance.   09/28/2021: Jeanne Becker with Jeanne Napoleon NP for 6 month follow up. Tx in September for URI with z pack. Still having productive cough with clear sputum and some ear pain. Unable to afford Trelegy. Smoking 1 ppd; unable to tolerate NRT in past d/t nausea. Noted to have impacted cerumen on exam. Started on Advair. Mucinex DM. LDCT planned for November with lung cancer screening program.   02/17/2022: Today - acute visit Patient presents today for acute visit.  Her symptoms started about 2-3 weeks ago and have persisted since with minimal improvement. She has been having some increased nasal congestion and drainage as well as productive cough with clear thick sputum.  She has had some associated chest tightness and increased shortness of breath with exertion/coughing. She has not noticed any wheezing and denies fevers or recent sick exposures. She denies orthopnea, PND, chest pain or lower extremity swelling. She has not been using her Advair as prescribed due to cost. She has been using her albuterol a few times a  day.   Allergies  Allergen Reactions   Codeine Rash   Ivp Dye [Iodinated Contrast Media] Other (See Comments)   Lexapro [Escitalopram] Nausea And Vomiting   Penicillins Other (See Comments)    Intolerance     Immunization History  Administered Date(s) Administered   DTaP 01/28/2014   Fluad Quad(high Dose 65+) 09/28/2021   Influenza Split 09/09/2021   Influenza Whole 10/07/2012   Influenza, High Dose Seasonal PF 11/11/2016, 09/15/2017   Influenza,inj,Quad PF,6+ Mos 11/06/2012, 08/31/2013, 01/08/2015, 02/19/2016, 09/14/2018, 09/21/2019   Influenza-Unspecified 11/06/2012, 02/19/2016, 11/11/2016, 09/15/2017   MMR 10/20/2001   Moderna Sars-Covid-2 Vaccination 09/22/2020, 10/20/2020   Pneumococcal Conjugate-13 07/17/2014   Pneumococcal Polysaccharide-23 08/31/2013   Tdap 01/29/2014    Past Medical History:  Diagnosis Date   Allergic rhinitis, cause unspecified    Anxiety state, unspecified    Chronic airway obstruction, not elsewhere classified    Depressive disorder, not elsewhere classified    Essential hypertension, benign    Fatigue    H/O diabetes mellitus    Hyperlipidemia    Lower back pain    Menopause    Osteopenia 06/25/2021   Panic disorder without agoraphobia    Seborrheic keratosis    Unspecified arthropathy, shoulder region     Tobacco History: Social History   Tobacco Use  Smoking Status Every Day   Packs/day: 1.50   Years: 30.00   Pack years: 45.00   Types: Cigarettes   Start date: 1974  Smokeless Tobacco Never  Tobacco Comments   currently smoking 1ppd as  of 02/27/21 ep   Ready to quit: Not Answered Counseling given: Not Answered Tobacco comments: currently smoking 1ppd as of 02/27/21 ep   Outpatient Medications Prior to Visit  Medication Sig Dispense Refill   acetaminophen (TYLENOL) 325 MG tablet Take 650 mg by mouth every 6 (six) hours as needed.     albuterol (VENTOLIN HFA) 108 (90 Base) MCG/ACT inhaler Inhale 2 puffs into the lungs every  4 (four) hours as needed for wheezing or shortness of breath. 8.5 each 4   aspirin 81 MG EC tablet Take 1 tablet (81 mg total) by mouth daily. Swallow whole. 30 tablet 12   CALCIUM PO Take by mouth daily.     fluconazole (DIFLUCAN) 150 MG tablet 1 po q week x 4 weeks 4 tablet 0   fluticasone (FLONASE) 50 MCG/ACT nasal spray SPRAY 2 SPRAYS INTO EACH NOSTRIL EVERY DAY 48 mL 0   fluticasone-salmeterol (ADVAIR) 250-50 MCG/ACT AEPB Inhale 1 puff into the lungs every 12 (twelve) hours. 60 each 11   rosuvastatin (CRESTOR) 10 MG tablet Take 1 tablet (10 mg total) by mouth daily. 90 tablet 0   VITAMIN D PO Take by mouth daily.     albuterol (PROVENTIL) (2.5 MG/3ML) 0.083% nebulizer solution Take 3 mLs (2.5 mg total) by nebulization every 4 (four) hours as needed for wheezing or shortness of breath. 450 mL 3   No facility-administered medications prior to visit.     Review of Systems:   Constitutional: No weight loss or gain, night sweats, fevers, chills, fatigue, or lassitude. HEENT: No headaches, difficulty swallowing, tooth/dental problems, or sore throat. No sneezing, itching, ear ache. +nasal congestion, clear drainage CV:  No chest pain, orthopnea, PND, swelling in lower extremities, anasarca, dizziness, palpitations, syncope Resp: +shortness of breath with exertion; productive cough with increased, thick sputum. No hemoptysis. No wheezing.  No chest wall deformity GI:  No heartburn, indigestion, abdominal pain, nausea, vomiting, diarrhea, change in bowel habits, loss of appetite, bloody stools.  GU: No dysuria, change in color of urine, urgency or frequency.  No flank pain, no hematuria  Skin: No rash, lesions, ulcerations MSK:  No joint pain or swelling.  No decreased range of motion.  No back pain. Neuro: No dizziness or lightheadedness.  Psych: No depression or anxiety. Mood stable.     Physical Exam:  BP 116/70 (BP Location: Left Arm, Patient Position: Sitting, Cuff Size: Normal)     Pulse 93    Temp 98.6 F (37 C) (Oral)    Ht 5' 2" (1.575 m)    Wt 96 lb (43.5 kg)    SpO2 95%    BMI 17.56 kg/m   GEN: Pleasant, interactive, well-kempt; in no acute distress HEENT:  Normocephalic and atraumatic. EACs patent bilaterally. TM pearly gray with present light reflex bilaterally. PERRLA. Sclera white. Nasal turbinates pink, moist and patent bilaterally. Clear rhinorrhea present. Oropharynx erythematous and moist, without exudate or edema. No lesions, ulcerations NECK:  Supple w/ fair ROM. No JVD present. Normal carotid impulses w/o bruits. Thyroid symmetrical with no goiter or nodules palpated. No lymphadenopathy.   CV: RRR, no m/r/g, no peripheral edema. Pulses intact, +2 bilaterally. No cyanosis, pallor or clubbing. PULMONARY:  Unlabored, regular breathing. Scattered rhonchi bilaterally A&P. No accessory muscle use. No dullness to percussion. GI: BS present and normoactive. Soft, non-tender to palpation. No organomegaly or masses detected. No CVA tenderness. MSK: No erythema, warmth or tenderness. Cap refil <2 sec all extrem. No deformities or joint swelling noted.  Neuro: A/Ox3. No focal deficits noted.   Skin: Warm, no lesions or rashe Psych: Normal affect and behavior. Judgement and thought content appropriate.     Lab Results:  CBC    Component Value Date/Time   WBC 5.5 05/15/2021 1539   WBC 5.8 12/09/2016 0931   RBC 4.31 05/15/2021 1539   RBC 4.31 12/09/2016 0931   HGB 15.1 05/15/2021 1539   HCT 43.5 05/15/2021 1539   PLT 187 05/15/2021 1539   MCV 101 (H) 05/15/2021 1539   MCH 35.0 (H) 05/15/2021 1539   MCHC 34.7 05/15/2021 1539   MCHC 34.3 12/09/2016 0931   RDW 11.5 (L) 05/15/2021 1539   LYMPHSABS 1.1 05/15/2021 1539   MONOABS 0.7 12/09/2016 0931   EOSABS 0.0 05/15/2021 1539   BASOSABS 0.0 05/15/2021 1539    BMET    Component Value Date/Time   NA 142 05/15/2021 1539   K 3.8 05/15/2021 1539   CL 102 05/15/2021 1539   CO2 24 05/15/2021 1539   GLUCOSE  116 (H) 05/15/2021 1539   BUN 9 05/15/2021 1539   CREATININE 0.92 05/15/2021 1539   CALCIUM 9.1 05/15/2021 1539   GFRNONAA 74 03/19/2020 1225   GFRAA 85 03/19/2020 1225    BNP No results found for: BNP   Imaging:  No results found.    PFT Results Latest Ref Rng & Units 03/15/2017 09/14/2016  FVC-Pre L 2.00 1.90  FVC-Predicted Pre % 67 63  FVC-Post L 2.17 1.99  FVC-Predicted Post % 73 66  Pre FEV1/FVC % % 71 69  Post FEV1/FCV % % 71 72  FEV1-Pre L 1.42 1.30  FEV1-Predicted Pre % 61 56  FEV1-Post L 1.55 1.43  DLCO uncorrected ml/min/mmHg - 15.29  DLCO UNC% % - 73  DLCO corrected ml/min/mmHg - 14.41  DLCO COR %Predicted % - 68  DLVA Predicted % - 78  TLC L - 4.86  TLC % Predicted % - 103  RV % Predicted % - 156    No results found for: NITRICOXIDE      Assessment & Plan:   Acute bronchitis with COPD (HCC) Acute flare likely r/t URI. Given length of symptoms, not appropriate for flu/COVID testing as she is out of the window for antiviral therapy. Prednisone taper and z pack. Discussed the importance of maintenance inhaler therapy - will reach out to pharmacy team to discuss more affordable options as Advair is >$100 a month.   Patient Instructions  -Continue Albuterol inhaler 2 puffs or 3 mL neb every 6 hours as needed for shortness of breath or wheezing. Notify if symptoms persist despite rescue inhaler/neb use. -Continue on Advair inhaler 1 puffs Twice daily. Brush tongue and rinse mouth afterwards. We will check with the pharmacy team and see if there are any other inhalers with better coverage. -Continue flonase nasal spray 1-2 sprays each nostril daily  -Saline nasal spray 2-3 times a day as needed for nasal congestion/postnasal drip until symptoms improve -Mucinex DM 600 mg twice daily as needed until symptoms improve -Chlortab 64m over the counter at night as needed for cough -Prednisone taper. 4 tabs for 2 days, then 3 tabs for 2 days, 2 tabs for 2 days,  then 1 tab for 2 days, then stop. Take in AM with food.  -Astelin nasal sprays 2 puffs Twice daily  -Z pack. Take 2 tabs on day one followed by 1 tab for four additional days  Follow up in a month with Dr. BLamonte Sakaior KAlanson Aly If symptoms  do not improve or worsen, please contact office for sooner follow up or seek emergency care.    URI (upper respiratory infection) URI vs chronic allergic rhinitis. Initiated cough and postnasal drip control regimens. Advised to follow up if symptoms do not improve.   Current smoker Has cut down from 2 ppd to 1 ppd. Smoking cessation discussed and advised. Agreed to continue to cut back. Unable to tolerate NRT.      Clayton Bibles, NP 02/17/2022  Pt aware and understands NP's role.

## 2022-02-17 NOTE — Assessment & Plan Note (Signed)
Has cut down from 2 ppd to 1 ppd. Smoking cessation discussed and advised. Agreed to continue to cut back. Unable to tolerate NRT.

## 2022-02-17 NOTE — Telephone Encounter (Signed)
It was noted that BREO is the cheapest option for this patient. Do you want to order [facility] or try something else.  Katie please advise

## 2022-02-18 ENCOUNTER — Other Ambulatory Visit: Payer: Self-pay | Admitting: Family Medicine

## 2022-02-18 DIAGNOSIS — I700 Atherosclerosis of aorta: Secondary | ICD-10-CM

## 2022-02-18 DIAGNOSIS — E782 Mixed hyperlipidemia: Secondary | ICD-10-CM

## 2022-02-18 NOTE — Telephone Encounter (Signed)
Patient is aware of below message and voiced her understanding.  Nothing further needed.   

## 2022-03-04 ENCOUNTER — Ambulatory Visit (INDEPENDENT_AMBULATORY_CARE_PROVIDER_SITE_OTHER): Payer: Medicare Other | Admitting: Family Medicine

## 2022-03-04 ENCOUNTER — Encounter: Payer: Self-pay | Admitting: Family Medicine

## 2022-03-04 VITALS — BP 122/71 | HR 99 | Temp 96.2°F | Ht 62.0 in | Wt 95.6 lb

## 2022-03-04 DIAGNOSIS — N898 Other specified noninflammatory disorders of vagina: Secondary | ICD-10-CM

## 2022-03-04 DIAGNOSIS — N760 Acute vaginitis: Secondary | ICD-10-CM | POA: Diagnosis not present

## 2022-03-04 DIAGNOSIS — B851 Pediculosis due to Pediculus humanus corporis: Secondary | ICD-10-CM

## 2022-03-04 LAB — WET PREP FOR TRICH, YEAST, CLUE
Clue Cell Exam: NEGATIVE
Trichomonas Exam: NEGATIVE
Yeast Exam: NEGATIVE

## 2022-03-04 NOTE — Patient Instructions (Signed)
Take a daily probiotic

## 2022-03-04 NOTE — Progress Notes (Signed)
? ?Assessment & Plan:  ?1. Acute vaginitis ?Education provided on vaginitis. Encouraged more water throughout the day and a daily probiotic.  ? ?2. Vaginal discharge ?Reassurance provided she does not have yeast, trich, or BV. Patient declined further testing for other STIs.  ?- WET PREP FOR TRICH, YEAST, CLUE ? ? ? ?Follow up plan: Return if symptoms worsen or fail to improve. ? ?[name], MSN, APRN, FNP-C ?[facility] ? ?Subjective:  ? ?Patient ID: Jeanne Becker, female    DOB: 1956-01-30, 66 y.o.   MRN: [id number] ? ?HPI: ?[name] is a 66 y.o. female presenting on 03/04/2022 for Vaginal Discharge (Patient states she recently finished an abx and now is having vaginal discharge. X 1 week/) ? ?Patient reports she just finished a round of antibiotics and then started having a thin white vaginal discharge. She wants to make sure she didn't develop a yeast infection from the antibiotics. She is drinking three bottles of water per day.  ? ? ?ROS: Negative unless specifically indicated above in HPI.  ? ?Relevant past medical history reviewed and updated as indicated.  ? ?Allergies and medications reviewed and updated. ? ? ?Current Outpatient Medications:  ?  acetaminophen (TYLENOL) 325 MG tablet, Take 650 mg by mouth every 6 (six) hours as needed., Disp: , Rfl:  ?  albuterol (PROVENTIL) (2.5 MG/3ML) 0.083% nebulizer solution, Take 3 mLs (2.5 mg total) by nebulization every 4 (four) hours as needed for wheezing or shortness of breath., Disp: 450 mL, Rfl: 3 ?  albuterol (VENTOLIN HFA) 108 (90 Base) MCG/ACT inhaler, Inhale 2 puffs into the lungs every 4 (four) hours as needed for wheezing or shortness of breath., Disp: 8.5 each, Rfl: 4 ?  aspirin 81 MG EC tablet, Take 1 tablet (81 mg total) by mouth daily. Swallow whole., Disp: 30 tablet, Rfl: 12 ?  azelastine (ASTELIN) 0.1 % nasal spray, Place 2 sprays into both nostrils 2 (two) times daily. Use in each nostril as directed, Disp: 30 mL,  Rfl: 2 ?  CALCIUM PO, Take by mouth daily., Disp: , Rfl:  ?  fluticasone (FLONASE) 50 MCG/ACT nasal spray, SPRAY 2 SPRAYS INTO EACH NOSTRIL EVERY DAY, Disp: 48 mL, Rfl: 0 ?  fluticasone furoate-vilanterol (BREO ELLIPTA) 100-25 MCG/ACT AEPB, Inhale 1 puff into the lungs daily., Disp: 30 each, Rfl: 5 ?  rosuvastatin (CRESTOR) 10 MG tablet, TAKE 1 TABLET BY MOUTH EVERY DAY, Disp: 90 tablet, Rfl: 0 ?  VITAMIN D PO, Take by mouth daily., Disp: , Rfl:  ? ?Allergies  ?Allergen Reactions  ? Codeine Rash  ? Ivp Dye [Iodinated Contrast Media] Other (See Comments)  ? Lexapro [Escitalopram] Nausea And Vomiting  ? Penicillins Other (See Comments)  ?  Intolerance ?  ? ? ?Objective:  ? ?BP 122/71   Pulse 99   Temp (!) 96.2 ?F (35.7 ?C) (Temporal)   Ht 5\' 2"  (1.575 m)   Wt 95 lb 9.6 oz (43.4 kg)   BMI 17.49 kg/m?   ? ?Physical Exam ?Vitals reviewed.  ?Constitutional:   ?   General: She is not in acute distress. ?   Appearance: Normal appearance. She is not ill-appearing, toxic-appearing or diaphoretic.  ?HENT:  ?   Head: Normocephalic and atraumatic.  ?Eyes:  ?   General: No scleral icterus.    ?   Right eye: No discharge.     ?   Left eye: No discharge.  ?   Conjunctiva/sclera: Conjunctivae normal.  ?Cardiovascular:  ?  Rate and Rhythm: Normal rate.  ?Pulmonary:  ?   Effort: Pulmonary effort is normal. No respiratory distress.  ?Musculoskeletal:     ?   General: Normal range of motion.  ?   Cervical back: Normal range of motion.  ?Skin: ?   General: Skin is warm and dry.  ?   Capillary Refill: Capillary refill takes less than 2 seconds.  ?Neurological:  ?   General: No focal deficit present.  ?   Mental Status: She is alert and oriented to person, place, and time. Mental status is at baseline.  ?Psychiatric:     ?   Mood and Affect: Mood normal.     ?   Behavior: Behavior normal.     ?   Thought Content: Thought content normal.     ?   Judgment: Judgment normal.  ? ? ? ? ? ? ?

## 2022-03-05 ENCOUNTER — Encounter: Payer: Self-pay | Admitting: Family Medicine

## 2022-03-17 ENCOUNTER — Encounter: Payer: Self-pay | Admitting: Emergency Medicine

## 2022-03-17 ENCOUNTER — Ambulatory Visit: Payer: Medicare Other | Admitting: Emergency Medicine

## 2022-03-17 ENCOUNTER — Other Ambulatory Visit: Payer: Self-pay

## 2022-03-17 DIAGNOSIS — J449 Chronic obstructive pulmonary disease, unspecified: Secondary | ICD-10-CM | POA: Diagnosis not present

## 2022-03-17 DIAGNOSIS — F17200 Nicotine dependence, unspecified, uncomplicated: Secondary | ICD-10-CM | POA: Diagnosis not present

## 2022-03-17 DIAGNOSIS — J309 Allergic rhinitis, unspecified: Secondary | ICD-10-CM | POA: Diagnosis not present

## 2022-03-17 MED ORDER — FLUTICASONE PROPIONATE 50 MCG/ACT NA SUSP: NASAL | 0 refills | Status: DC

## 2022-03-17 NOTE — Progress Notes (Signed)
? ?Subjective:  ? ? Patient ID: Jeanne Becker, female    DOB: 1956-05-04, 66 y.o.   MRN: [id number] ? ?HPI ? ?ROV 02/27/21 --follow-up visit 66 year old woman with moderate to severe COPD.  She continues to smoke.  She has positive bronchodilator response, also allergic rhinitis.  She was having flaring rhinitis symptoms about 1 month ago including small amount of epistaxis.  Managed on Trelegy - but she has not been using reliably, not every day. Uses albuterol about 2-3x day.  ?Today she reports that she has been doing a bit better since she used decongestant for a week. Has not been on flonase, uses saline nasal spray. Sometimes has nasal dryness. She has some tightness in her chest, happens w activity outside. She likes to work in garden.  ?She has gotten her COVID shots x 2.  ?She is smoking a pack/day.  ?No flares or abx since last time.  ? ?ROV 03/17/22 --66 year old woman with a history of tobacco use, continues to smoke.  She has moderate severe COPD with a positive bronchodilator response.  Also allergic rhinitis that significantly impacts her breathing, cough, obstructive lung disease.  She was seen in our office about 1 month ago and setting of acute flare that seem to be related to a viral upper respiratory infection.  Maintenance had been Trelegy, currently on Breo for cost reasons. She is not sure that she likes the powder, or that it works as well. She is having trouble with more nasal congestion, leads to some wheeze. Using albuterol 1-2x a day, does help her.  ? ?Review of Systems ?As per HPI ? ?   ?Objective:  ? Physical Exam ?Vitals:  ? 03/17/22 0858  ?BP: 122/78  ?Pulse: 85  ?Temp: 98.1 ?F (36.7 ?C)  ?TempSrc: Oral  ?SpO2: 99%  ?Weight: 96 lb 9.6 oz (43.8 kg)  ?Height: 5' 1.5" (1.562 m)  ? ?Gen: Pleasant, thin woman, in no distress,  normal affect ? ?ENT: No lesions,  mouth clear, no postnasal drip ? ?Neck: No JVD, no stridor ? ?Lungs: No use of accessory muscle, no wheezing.  Few scattered  expiratory rhonchi ? ?Cardiovascular: RRR, heart sounds normal, no murmur or gallops, no peripheral edema ? ?Musculoskeletal: No deformities, no cyanosis or clubbing ? ?Neuro: alert, non focal ? ?Skin: Warm, no lesions or rashes ? ? ?   ?Assessment & Plan:  ?COPD, moderately severe ?Exacerbation in the setting of URI 1 month ago.  Labile symptoms now that she is dealing with the allergy season.  She is on Breo, needs to be back on LAMA as well.  She benefited from Trelegy in the past we will try to get her back on this.  We need her formulary, question whether she has a medication deductible? ? ?We will continue Breo 1 inhalation once daily for now.  Rinse and gargle after using. ?Try to get a copy of your insurance inhaler formulary and forward to [place] so we can use this to help select a different every day maintenance inhaler.  We will try to get you back on Trelegy since you have benefited from this before. ?Keep your albuterol available to use either 1 nebulizer treatment or 2 puffs when you needed for shortness of breath, chest tightness, wheezing. ?Follow with Dr Delton Coombes in 3 months or sooner if you have any problems. ? ?Chronic allergic rhinitis ?Try using your fluticasone (Flonase) nasal spray 2 sprays each nostril once daily through the allergy season.  Then you can change  back to using it as needed. ?Keep your Astelin nasal spray available to use 2 sprays each nostril when you need it for congestion and drainage ? ?Current smoker ?Continue to work on decreasing your smoking. ? ?Levy Pupa, MD, PhD ?03/17/2022, 9:23 AM ?[facility] ?[phone] or if no answer [phone] ? ?

## 2022-03-17 NOTE — Assessment & Plan Note (Signed)
Exacerbation in the setting of URI 1 month ago.  Labile symptoms now that she is dealing with the allergy season.  She is on Breo, needs to be back on LAMA as well.  She benefited from Trelegy in the past we will try to get her back on this.  We need her formulary, question whether she has a medication deductible? ? ?We will continue Breo 1 inhalation once daily for now.  Rinse and gargle after using. ?Try to get a copy of your insurance inhaler formulary and forward to [place] so we can use this to help select a different every day maintenance inhaler.  We will try to get you back on Trelegy since you have benefited from this before. ?Keep your albuterol available to use either 1 nebulizer treatment or 2 puffs when you needed for shortness of breath, chest tightness, wheezing. ?Follow with Dr Delton Coombes in 3 months or sooner if you have any problems. ?

## 2022-03-17 NOTE — Addendum Note (Signed)
Addended by: Marylynn Pearson on: 03/17/2022 09:38 AM ? ? Modules accepted: Orders ? ?

## 2022-03-17 NOTE — Assessment & Plan Note (Signed)
Continue to work on decreasing your smoking. ?

## 2022-03-17 NOTE — Assessment & Plan Note (Signed)
Try using your fluticasone (Flonase) nasal spray 2 sprays each nostril once daily through the allergy season.  Then you can change back to using it as needed. ?Keep your Astelin nasal spray available to use 2 sprays each nostril when you need it for congestion and drainage ?

## 2022-03-17 NOTE — Patient Instructions (Signed)
We will continue Breo 1 inhalation once daily for now.  Rinse and gargle after using. ?Try to get a copy of your insurance inhaler formulary and forward to [place] so we can use this to help select a different every day maintenance inhaler.  We will try to get you back on Trelegy since you have benefited from this before. ?Keep your albuterol available to use either 1 nebulizer treatment or 2 puffs when you needed for shortness of breath, chest tightness, wheezing. ?Try using your fluticasone (Flonase) nasal spray 2 sprays each nostril once daily through the allergy season.  Then you can change back to using it as needed. ?Keep your Astelin nasal spray available to use 2 sprays each nostril when you need it for congestion and drainage ?Continue to work on decreasing your smoking. ?Follow with Dr Delton Coombes in 3 months or sooner if you have any problems. ? ?

## 2022-04-05 ENCOUNTER — Telehealth: Payer: Self-pay | Admitting: Emergency Medicine

## 2022-04-05 MED ORDER — PREDNISONE 10 MG PO TABS: ORAL_TABLET | ORAL | 0 refills | Status: DC

## 2022-04-05 NOTE — Telephone Encounter (Signed)
Please have her try mucinex 600mg  qd ?Start fluticasone nasal spray 2 sprays each side qd if she is not already doing ?Have her take prednisone taper >> Take 40mg  daily for 3 days, then 30mg  daily for 3 days, then 20mg  daily for 3 days, then 10mg  daily for 3 days, then stop ? ?

## 2022-04-05 NOTE — Telephone Encounter (Signed)
Called and spoke with patient. She stated she is already taking the nasal spray and the mucinex. Patient verified her pharmacy. Prednisone taper was sent in to her pharmacy. Nothing further needed.  ?

## 2022-04-05 NOTE — Telephone Encounter (Signed)
Called and spoke with patient. She states that she has COPD and bronchitis and her nose is stuffy and she has congestion and her chest is tight. Patient wants to know if Dr. Lamonte Sakai can send in anything to help loosen her chest up. ? ?RB, please advise. ?

## 2022-04-09 ENCOUNTER — Other Ambulatory Visit: Payer: Self-pay | Admitting: Nurse Practitioner

## 2022-04-09 DIAGNOSIS — J069 Acute upper respiratory infection, unspecified: Secondary | ICD-10-CM

## 2022-04-21 ENCOUNTER — Other Ambulatory Visit: Payer: Self-pay | Admitting: Family Medicine

## 2022-04-21 ENCOUNTER — Telehealth: Payer: Self-pay | Admitting: Emergency Medicine

## 2022-04-21 DIAGNOSIS — E782 Mixed hyperlipidemia: Secondary | ICD-10-CM

## 2022-04-21 DIAGNOSIS — I700 Atherosclerosis of aorta: Secondary | ICD-10-CM

## 2022-04-21 NOTE — Telephone Encounter (Signed)
Patient called in with cough and wheezing, h/o COPD. Using Inhalers at home. Patient can talk to me without getting out of breath, no fever, chills. Did home COVID test and was negative. Wants to be seen. Appt made for tomorrow at 10:50am with Britney. Advised patient to call EMS/go to ED if symptoms worsen before tomorrow.  ? ?Please call our office to discuss your lab results. Our number here is [phone].  ? ?

## 2022-04-22 ENCOUNTER — Ambulatory Visit (INDEPENDENT_AMBULATORY_CARE_PROVIDER_SITE_OTHER): Payer: Medicare Other | Admitting: Family Medicine

## 2022-04-22 ENCOUNTER — Encounter: Payer: Self-pay | Admitting: Family Medicine

## 2022-04-22 VITALS — BP 125/77 | HR 105 | Temp 97.9°F | Ht 61.5 in | Wt 94.6 lb

## 2022-04-22 DIAGNOSIS — J0140 Acute pansinusitis, unspecified: Secondary | ICD-10-CM | POA: Diagnosis not present

## 2022-04-22 DIAGNOSIS — M542 Cervicalgia: Secondary | ICD-10-CM | POA: Diagnosis not present

## 2022-04-22 MED ORDER — DOXYCYCLINE HYCLATE 100 MG PO TABS: 100.0000 mg | ORAL_TABLET | Freq: Two times a day (BID) | ORAL | 0 refills | Status: AC

## 2022-04-22 MED ORDER — METHOCARBAMOL 500 MG PO TABS: 500.0000 mg | ORAL_TABLET | Freq: Three times a day (TID) | ORAL | 0 refills | Status: DC | PRN

## 2022-04-22 NOTE — Progress Notes (Signed)
? ?Assessment & Plan:  ?1. Acute non-recurrent pansinusitis ?Discussed symptom management. ?- doxycycline (VIBRA-TABS) 100 MG tablet; Take 1 tablet (100 mg total) by mouth 2 (two) times daily for 7 days.  Dispense: 14 tablet; Refill: 0 ? ?2. Musculoskeletal neck pain ?Education provided on muscle pain. Encouraged use of muscle relaxer, muscle rub, and heating pad. ?- methocarbamol (ROBAXIN) 500 MG tablet; Take 1 tablet (500 mg total) by mouth every 8 (eight) hours as needed for muscle spasms.  Dispense: 60 tablet; Refill: 0 ? ? ?Follow up plan: Return if symptoms worsen or fail to improve. ? ?Hendricks Limes, MSN, APRN, FNP-C ?[facility] ? ?Subjective:  ? ?Patient ID: Jeanne Becker, female    DOB: 07/26/1956, 66 y.o.   MRN: [id number] ? ?HPI: ?Jeanne Becker is a 66 y.o. female presenting on 04/22/2022 for Cough, Nasal Congestion, Generalized Body Aches, Wheezing (X 1 month but has gotten worse ), and Shoulder Pain (Left x 2 weeks) ? ?Patient complains of cough, head/chest congestion, runny nose, sneezing, ear pain/pressure, shortness of breath, wheezing, swollen lymph nodes, and body aches . Cough is productive of darker/thicker sputum. Onset of symptoms was 1 week ago, gradually worsening since that time. She is drinking plenty of fluids. Evaluation to date: at home COVID test negative. Treatment to date:  inhaler, nasal spray, Mucinex, and sinus medications . She has a history of COPD. She does smoke.  ? ?Patient also complains of left shoulder pain that started two weeks ago after working out in the yard. The pain is described as aching and popping .  The pain occurs when active. One episode of dislocation. Symptoms are aggravated by all activities. Symptoms are diminished by muscle rubs and peanut oil.    ? ? ? ?ROS: Negative unless specifically indicated above in HPI.  ? ?Relevant past medical history reviewed and updated as indicated.  ? ?Allergies and medications reviewed and  updated. ? ? ?Current Outpatient Medications:  ?  acetaminophen (TYLENOL) 325 MG tablet, Take 650 mg by mouth every 6 (six) hours as needed., Disp: , Rfl:  ?  albuterol (PROVENTIL) (2.5 MG/3ML) 0.083% nebulizer solution, Take 3 mLs (2.5 mg total) by nebulization every 4 (four) hours as needed for wheezing or shortness of breath., Disp: 450 mL, Rfl: 3 ?  albuterol (VENTOLIN HFA) 108 (90 Base) MCG/ACT inhaler, Inhale 2 puffs into the lungs every 4 (four) hours as needed for wheezing or shortness of breath., Disp: 8.5 each, Rfl: 4 ?  aspirin 81 MG EC tablet, Take 1 tablet (81 mg total) by mouth daily. Swallow whole., Disp: 30 tablet, Rfl: 12 ?  Azelastine HCl 137 MCG/SPRAY SOLN, PLACE 2 SPRAYS INTO BOTH NOSTRILS 2 (TWO) TIMES DAILY. USE IN EACH NOSTRIL AS DIRECTED, Disp: 30 mL, Rfl: 3 ?  fluticasone (FLONASE) 50 MCG/ACT nasal spray, SPRAY 2 SPRAYS INTO EACH NOSTRIL EVERY DAY, Disp: 48 mL, Rfl: 0 ?  fluticasone furoate-vilanterol (BREO ELLIPTA) 100-25 MCG/ACT AEPB, Inhale 1 puff into the lungs daily., Disp: 30 each, Rfl: 5 ?  rosuvastatin (CRESTOR) 10 MG tablet, TAKE 1 TABLET BY MOUTH EVERY DAY, Disp: 90 tablet, Rfl: 0 ?  CALCIUM PO, Take by mouth daily. (Patient not taking: Reported on 04/22/2022), Disp: , Rfl:  ?  VITAMIN D PO, Take by mouth daily. (Patient not taking: Reported on 04/22/2022), Disp: , Rfl:  ? ?Allergies  ?Allergen Reactions  ? Codeine Rash  ? Ivp Dye [Iodinated Contrast Media] Other (See Comments)  ? Lexapro [Escitalopram] Nausea  And Vomiting  ? Penicillins Other (See Comments)  ?  Intolerance ?  ? ? ?Objective:  ? ?BP 125/77   Pulse (!) 105   Temp 97.9 ?F (36.6 ?C) (Temporal)   Ht 5' 1.5" (1.562 m)   Wt 94 lb 9.6 oz (42.9 kg)   SpO2 98%   BMI 17.59 kg/m?   ? ?Physical Exam ?Vitals reviewed.  ?Constitutional:   ?   General: She is not in acute distress. ?   Appearance: Normal appearance. She is not ill-appearing, toxic-appearing or diaphoretic.  ?HENT:  ?   Head: Normocephalic and atraumatic.  ?    Right Ear: Tympanic membrane, ear canal and external ear normal. There is no impacted cerumen.  ?   Left Ear: Tympanic membrane, ear canal and external ear normal. There is no impacted cerumen.  ?   Nose: Nose normal. No congestion or rhinorrhea.  ?   Mouth/Throat:  ?   Mouth: Mucous membranes are moist.  ?   Pharynx: Oropharynx is clear. No oropharyngeal exudate or posterior oropharyngeal erythema.  ?Eyes:  ?   General: No scleral icterus.    ?   Right eye: No discharge.     ?   Left eye: No discharge.  ?   Conjunctiva/sclera: Conjunctivae normal.  ?Cardiovascular:  ?   Rate and Rhythm: Normal rate and regular rhythm.  ?   Heart sounds: Normal heart sounds. No murmur heard. ?  No friction rub. No gallop.  ?Pulmonary:  ?   Effort: Pulmonary effort is normal. No respiratory distress.  ?   Breath sounds: Normal breath sounds. No stridor. No wheezing, rhonchi or rales.  ?Musculoskeletal:     ?   General: Normal range of motion.  ?   Cervical back: Normal range of motion. Muscular tenderness present. No spinous process tenderness.  ?Lymphadenopathy:  ?   Cervical: No cervical adenopathy.  ?Skin: ?   General: Skin is warm and dry.  ?   Capillary Refill: Capillary refill takes less than 2 seconds.  ?Neurological:  ?   General: No focal deficit present.  ?   Mental Status: She is alert and oriented to person, place, and time. Mental status is at baseline.  ?Psychiatric:     ?   Mood and Affect: Mood normal.     ?   Behavior: Behavior normal.     ?   Thought Content: Thought content normal.     ?   Judgment: Judgment normal.  ? ? ? ? ? ? ?

## 2022-05-19 ENCOUNTER — Ambulatory Visit (INDEPENDENT_AMBULATORY_CARE_PROVIDER_SITE_OTHER): Payer: Medicare Other | Admitting: Family Medicine

## 2022-05-19 ENCOUNTER — Other Ambulatory Visit: Payer: Self-pay | Admitting: Family Medicine

## 2022-05-19 ENCOUNTER — Encounter: Payer: Self-pay | Admitting: Family Medicine

## 2022-05-19 ENCOUNTER — Ambulatory Visit (INDEPENDENT_AMBULATORY_CARE_PROVIDER_SITE_OTHER): Payer: Medicare Other

## 2022-05-19 VITALS — Wt 98.0 lb

## 2022-05-19 VITALS — BP 124/74 | HR 74 | Temp 98.6°F | Ht 61.0 in | Wt 94.2 lb

## 2022-05-19 DIAGNOSIS — Z0000 Encounter for general adult medical examination without abnormal findings: Secondary | ICD-10-CM

## 2022-05-19 DIAGNOSIS — J0140 Acute pansinusitis, unspecified: Secondary | ICD-10-CM | POA: Diagnosis not present

## 2022-05-19 DIAGNOSIS — J302 Other seasonal allergic rhinitis: Secondary | ICD-10-CM | POA: Diagnosis not present

## 2022-05-19 DIAGNOSIS — Z1231 Encounter for screening mammogram for malignant neoplasm of breast: Secondary | ICD-10-CM

## 2022-05-19 MED ORDER — LEVOCETIRIZINE DIHYDROCHLORIDE 5 MG PO TABS: 5.0000 mg | ORAL_TABLET | Freq: Every evening | ORAL | 2 refills | Status: DC

## 2022-05-19 MED ORDER — DOXYCYCLINE HYCLATE 100 MG PO TABS: 100.0000 mg | ORAL_TABLET | Freq: Two times a day (BID) | ORAL | 0 refills | Status: AC

## 2022-05-19 NOTE — Progress Notes (Signed)
Subjective:   Jeanne Becker is a 66 y.o. female who presents for Medicare Annual (Subsequent) preventive examination.  Virtual Visit via Telephone Note  I connected with  Jeanne Becker on 05/19/22 at 10:30 AM EDT by telephone and verified that I am speaking with the correct person using two identifiers.  Location: Patient: Home Provider: [facility] Persons participating in the virtual visit: patient/Nurse Health Advisor   I discussed the limitations, risks, security and privacy concerns of performing an evaluation and management service by telephone and the availability of in person appointments. The patient expressed understanding and agreed to proceed.  Interactive audio and video telecommunications were attempted between this nurse and patient, however failed, due to patient having technical difficulties OR patient did not have access to video capability.  We continued and completed visit with audio only.  Some vital signs may be absent or patient reported.   Jeanne Becker E Jeanne Hankins, LPN   Review of Systems     Cardiac Risk Factors include: advanced age (>35mn, >>85women);smoking/ tobacco exposure;sedentary lifestyle;dyslipidemia;Other (see comment), Risk factor comments: aortic atherosclerosis, COPD     Objective:    Today's Vitals   05/19/22 1035 05/19/22 1036  Weight: 98 lb (44.5 kg)   PainSc:  8    Body mass index is 18.22 kg/m.     05/19/2022   10:44 AM 05/18/2021   10:56 AM 04/03/2020    1:54 PM 08/20/2015    9:00 AM 01/08/2015    9:44 AM  Advanced Directives  Does Patient Have a Medical Advance Directive? No No No No No  Would patient like information on creating a medical advance directive? No - Patient declined Yes (MAU/Ambulatory/Procedural Areas - Information given) No - Patient declined      Current Medications (verified) Outpatient Encounter Medications as of 05/19/2022  Medication Sig   acetaminophen (TYLENOL) 325 MG tablet Take 650 mg by mouth every 6 (six)  hours as needed.   albuterol (PROVENTIL) (2.5 MG/3ML) 0.083% nebulizer solution Take 3 mLs (2.5 mg total) by nebulization every 4 (four) hours as needed for wheezing or shortness of breath.   albuterol (VENTOLIN HFA) 108 (90 Base) MCG/ACT inhaler Inhale 2 puffs into the lungs every 4 (four) hours as needed for wheezing or shortness of breath.   aspirin 81 MG EC tablet Take 1 tablet (81 mg total) by mouth daily. Swallow whole.   Azelastine HCl 137 MCG/SPRAY SOLN PLACE 2 SPRAYS INTO BOTH NOSTRILS 2 (TWO) TIMES DAILY. USE IN EACH NOSTRIL AS DIRECTED   CALCIUM PO Take by mouth daily.   fluticasone (FLONASE) 50 MCG/ACT nasal spray SPRAY 2 SPRAYS INTO EACH NOSTRIL EVERY DAY   fluticasone furoate-vilanterol (BREO ELLIPTA) 100-25 MCG/ACT AEPB Inhale 1 puff into the lungs daily.   rosuvastatin (CRESTOR) 10 MG tablet TAKE 1 TABLET BY MOUTH EVERY DAY   VITAMIN D PO Take by mouth daily.   methocarbamol (ROBAXIN) 500 MG tablet Take 1 tablet (500 mg total) by mouth every 8 (eight) hours as needed for muscle spasms. (Patient not taking: Reported on 05/19/2022)   No facility-administered encounter medications on file as of 05/19/2022.    Allergies (verified) Codeine, Ivp dye [iodinated contrast media], Lexapro [escitalopram], and Penicillins   History: Past Medical History:  Diagnosis Date   Allergic rhinitis, cause unspecified    Anxiety state, unspecified    Chronic airway obstruction, not elsewhere classified    Depressive disorder, not elsewhere classified    Essential hypertension, benign    Fatigue  H/O diabetes mellitus    Hyperlipidemia    Lower back pain    Menopause    Osteopenia 06/25/2021   Panic disorder without agoraphobia    Seborrheic keratosis    Unspecified arthropathy, shoulder region    Past Surgical History:  Procedure Laterality Date   APPENDECTOMY     BREAST BIOPSY  12/2012   benign   CESAREAN SECTION     TONSILLECTOMY     TUBAL LIGATION     Family History  Problem  Relation Age of Onset   Cancer Other    Allergies Other    Diabetes Other    Heart disease Other    Hypertension Other    Migraines Other    Osteoporosis Other    Seizures Other    COPD Mother    Heart disease Mother    Asthma Son    Stroke Father    Heart attack Brother    Aneurysm Brother    Throat cancer Maternal Grandfather    Diabetes Paternal Grandmother    Heart attack Paternal Grandmother    Pneumonia Paternal Grandfather    Social History   Socioeconomic History   Marital status: Married    Spouse name: Pilar Plate   Number of children: 2   Years of education: Not on file   Highest education level: Not on file  Occupational History   Occupation: retired/disabled  Tobacco Use   Smoking status: Every Day    Packs/day: 1.50    Years: 30.00    Pack years: 45.00    Types: Cigarettes    Start date: 1974   Smokeless tobacco: Never   Tobacco comments:    Smokes 15 cigarettes daily 03/17/22 ARJ, RN   Vaping Use   Vaping Use: Never used  Substance and Sexual Activity   Alcohol use: Not Currently    Alcohol/week: 12.0 standard drinks    Types: 12 Cans of beer per week    Comment: 2 drinks/day or fewer   Drug use: Not Currently    Types: Marijuana   Sexual activity: Yes    Partners: Male    Birth control/protection: Surgical  Other Topics Concern   Not on file  Social History Narrative   Originally from [place]. Always lived in [place]. 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 Also worked in [employer] She has a dog currently. Remote exposure to love birds. No mold exposure. Husband and granddaughter live with her. She has 2 sons.   Social Determinants of Health   Financial Resource Strain: Low Risk    Difficulty of Paying Living Expenses: Not hard at all  Food Insecurity: No Food Insecurity   Worried About Charity fundraiser in the Last Year: Never true   [place] in the Last Year: Never true  Transportation Needs: No Transportation  Needs   Lack of Transportation (Medical): No   Lack of Transportation (Non-Medical): No  Physical Activity: Insufficiently Active   Days of Exercise per Week: 7 days   Minutes of Exercise per Session: 20 min  Stress: Stress Concern Present   Feeling of Stress : To some extent  Social Connections: Moderately Integrated   Frequency of Communication with Friends and Family: More than three times a week   Frequency of Social Gatherings with Friends and Family: Twice a week   Attends Religious Services: Never   Marine scientist or Organizations: Yes   Attends Archivist Meetings: 1 to 4 times  per year   Marital Status: Married    Tobacco Counseling Ready to quit: Not Answered Counseling given: Not Answered Tobacco comments: Smokes 15 cigarettes daily 03/17/22 ARJ, RN    Clinical Intake:  Pre-visit preparation completed: Yes  Pain : 0-10 Pain Score: 8  Pain Type: Chronic pain Pain Location: Ear Pain Orientation: Left Pain Radiating Towards: left shoulder Pain Descriptors / Indicators: Aching, Sharp, Shooting, Discomfort Pain Onset: More than a month ago Pain Frequency: Intermittent     BMI - recorded: 17.47 Nutritional Status: BMI <19  Underweight Nutritional Risks: None Diabetes: No  How often do you need to have someone help you when you read instructions, pamphlets, or other written materials from your doctor or pharmacy?: 1 - Never  Diabetic? no  Interpreter Needed?: No  Information entered by :: Prisila Dlouhy, LPN   Activities of Daily Living    05/19/2022   10:44 AM  In your present state of health, do you have any difficulty performing the following activities:  Hearing? 0  Vision? 0  Difficulty concentrating or making decisions? 0  Walking or climbing stairs? 1  Comment just gets out of breath  Dressing or bathing? 0  Doing errands, shopping? 0  Preparing Food and eating ? N  Using the Toilet? N  In the past six months, have you  accidently leaked urine? Y  Comment mild - usually when she has coughing spells  Do you have problems with loss of bowel control? N  Managing your Medications? N  Managing your Finances? N  Housekeeping or managing your Housekeeping? N    Patient Care Team: Loman Brooklyn, FNP as PCP - General (Family Medicine) Collene Gobble, MD as Consulting Physician (Pulmonary Disease) Harlen Labs, MD as Referring Physician (Optometry) Leta Baptist, MD as Consulting Physician (Otolaryngology)  Indicate any recent Medical Services you may have received from other than [facility] providers in the past year (date may be approximate).     Assessment:   This is a routine wellness examination for [place].  Hearing/Vision screen Hearing Screening - Comments:: Denies hearing difficulties  - she does have pain in left ear - saw Dr Benjamine Mola for this  Vision Screening - Comments:: Denies vision difficulties - behind with annual eye exams with [facility] in [place]  Dietary issues and exercise activities discussed: Current Exercise Habits: Home exercise routine, Type of exercise: walking, Time (Minutes): 20, Frequency (Times/Week): 7, Weekly Exercise (Minutes/Week): 140, Intensity: Mild, Exercise limited by: respiratory conditions(s)   Goals Addressed               This Visit's Progress     Have 3 meals a day (pt-stated)   On track     Pt does not have much of an appetite, she will try to eat alittle something at breakfast, lunch and supper.       Depression Screen    05/19/2022   10:41 AM 04/22/2022   10:58 AM 03/04/2022    4:10 PM 11/18/2021   10:58 AM 06/25/2021    2:04 PM 05/18/2021   10:42 AM 05/15/2021    3:01 PM  PHQ 2/9 Scores  PHQ - 2 Score 0 0 0 0 0 0 0  PHQ- 9 Score 2 0 1 0 0 1 1    Fall Risk    05/19/2022   10:38 AM 04/22/2022   10:58 AM 03/04/2022    4:09 PM 11/18/2021   10:58 AM 05/18/2021   10:56 AM  Fall Risk   Falls in the past year? 0 0 0 0 0  Number falls in past yr: 0     0  Injury with Fall? 0    0  Risk for fall due to : Orthopedic patient    Orthopedic patient;Impaired vision  Follow up Education provided;Falls prevention discussed    Education provided;Falls prevention discussed    FALL RISK PREVENTION PERTAINING TO THE HOME:  Any stairs in or around the home? No  If so, are there any without handrails? No  Home free of loose throw rugs in walkways, pet beds, electrical cords, etc? Yes  Adequate lighting in your home to reduce risk of falls? Yes   ASSISTIVE DEVICES UTILIZED TO PREVENT FALLS:  Life alert? No  Use of a cane, walker or w/c? No  Grab bars in the bathroom? Yes  Shower chair or bench in shower? Yes  Elevated toilet seat or a handicapped toilet? Yes   TIMED UP AND GO:  Was the test performed? No . Telephonic visit  Cognitive Function:        05/19/2022   10:45 AM 04/03/2020    1:55 PM  6CIT Screen  What Year? 0 points 0 points  What month? 0 points 0 points  What time? 0 points 0 points  Count back from 20 0 points 0 points  Months in reverse 4 points 2 points  Repeat phrase 2 points 2 points  Total Score 6 points 4 points    Immunizations Immunization History  Administered Date(s) Administered   DTaP 01/28/2014   Fluad Quad(high Dose 65+) 09/28/2021   Influenza Split 09/09/2021   Influenza Whole 10/07/2012   Influenza, High Dose Seasonal PF 11/11/2016, 09/15/2017   Influenza,inj,Quad PF,6+ Mos 11/06/2012, 08/31/2013, 01/08/2015, 02/19/2016, 09/14/2018, 09/21/2019   Influenza-Unspecified 11/06/2012, 02/19/2016, 11/11/2016, 09/15/2017   MMR 10/20/2001   Moderna Sars-Covid-2 Vaccination 09/22/2020, 10/20/2020   Pneumococcal Conjugate-13 07/17/2014   Pneumococcal Polysaccharide-23 08/31/2013   Tdap 01/29/2014    TDAP status: Up to date  Flu Vaccine status: Up to date  Pneumococcal vaccine status: Up to date  Covid-19 vaccine status: Completed vaccines  Qualifies for Shingles Vaccine? Yes   Zostavax  completed No   Shingrix Completed?: No.    Education has been provided regarding the importance of this vaccine. Patient has been advised to call insurance company to determine out of pocket expense if they have not yet received this vaccine. Advised may also receive vaccine at local pharmacy or Health Dept. Verbalized acceptance and understanding.  Screening Tests Health Maintenance  Topic Date Due   Hepatitis C Screening  Never done   Zoster Vaccines- Shingrix (1 of 2) Never done   COVID-19 Vaccine (3 - Moderna risk series) 11/17/2020   MAMMOGRAM  04/29/2021   Pneumonia Vaccine 37+ Years old (3 - PPSV23 if available, else PCV20) 11/18/2022 (Originally 01/12/2021)   INFLUENZA VACCINE  07/27/2022   Fecal DNA (Cologuard)  04/11/2023   DEXA SCAN  06/27/2023   TETANUS/TDAP  01/30/2024   HPV VACCINES  Aged Out    Health Maintenance  Health Maintenance Due  Topic Date Due   Hepatitis C Screening  Never done   Zoster Vaccines- Shingrix (1 of 2) Never done   COVID-19 Vaccine (3 - Moderna risk series) 11/17/2020   MAMMOGRAM  04/29/2021    Colorectal cancer screening: Type of screening: Cologuard. Completed 04/10/2020. Repeat every 3 years  Mammogram status: Ordered 05/19/2022. Pt provided with contact info and advised to call to  schedule appt.  Scheduled for next week  Bone Density status: Completed 06/26/2021. Results reflect: Bone density results: OSTEOPENIA. Repeat every 2 years.  Lung Cancer Screening: (Low Dose CT Chest recommended if Age 25-80 years, 30 pack-year currently smoking OR have quit w/in 15years.) does qualify.   Lung Cancer Screening Referral: has order - plans to reschedule soon  Additional Screening:  Hepatitis C Screening: does qualify; DUE  Vision Screening: Recommended annual ophthalmology exams for early detection of glaucoma and other disorders of the eye. Is the patient up to date with their annual eye exam?  No  Who is the provider or what is the name of the  office in which the patient attends annual eye exams? [place] If pt is not established with a provider, would they like to be referred to a provider to establish care? No .   Dental Screening: Recommended annual dental exams for proper oral hygiene  Community Resource Referral / Chronic Care Management: CRR required this visit?  No   CCM required this visit?  No      Plan:     I have personally reviewed and noted the following in the patient's chart:   Medical and social history Use of alcohol, tobacco or illicit drugs  Current medications and supplements including opioid prescriptions.  Functional ability and status Nutritional status Physical activity Advanced directives List of other physicians Hospitalizations, surgeries, and ER visits in previous 12 months Vitals Screenings to include cognitive, depression, and falls Referrals and appointments  In addition, I have reviewed and discussed with patient certain preventive protocols, quality metrics, and best practice recommendations. A written personalized care plan for preventive services as well as general preventive health recommendations were provided to patient.     Sandrea Hammond, LPN   0/05/3493   Nurse Notes: Seeing Britney today for congestion - this is ongoing for months now - also left side of her head, ear and shoulder all ache - saw Dr Benjamine Mola, ENT months ago and he said everything looked fine.

## 2022-05-19 NOTE — Progress Notes (Signed)
Assessment & Plan:  1. Acute non-recurrent pansinusitis Education provided on sinusitis. - doxycycline (VIBRA-TABS) 100 MG tablet; Take 1 tablet (100 mg total) by mouth 2 (two) times daily for 7 days.  Dispense: 14 tablet; Refill: 0  2. Seasonal allergies Uncontrolled. Started Xyzal. Advised to let me know if symptoms do not improve. - levocetirizine (XYZAL) 5 MG tablet; Take 1 tablet (5 mg total) by mouth every evening.  Dispense: 30 tablet; Refill: 2   Follow up plan: Return if symptoms worsen or fail to improve.  [name], MSN, APRN, FNP-C [facility]  Subjective:   Patient ID: Jeanne Becker, female    DOB: 12-17-1956, 66 y.o.   MRN: [id number]  HPI: KIMISHA EUNICE is a 66 y.o. female presenting on 05/19/2022 for Nasal Congestion (On going for about 3 weeks - runny nose) and Wheezing (On going for about a week)  Patient complains of cough, head/chest congestion, sneezing, sore throat, ear pain/pressure, postnasal drainage, and wheezing. Onset of symptoms was 3 weeks ago, gradually worsening since that time. She is drinking plenty of fluids. Evaluation to date: none. Treatment to date: nasal steroids and Mucinex . She has a history of COPD. She does smoke.    ROS: Negative unless specifically indicated above in HPI.   Relevant past medical history reviewed and updated as indicated.   Allergies and medications reviewed and updated.   Current Outpatient Medications:    acetaminophen (TYLENOL) 325 MG tablet, Take 650 mg by mouth every 6 (six) hours as needed., Disp: , Rfl:    albuterol (PROVENTIL) (2.5 MG/3ML) 0.083% nebulizer solution, Take 3 mLs (2.5 mg total) by nebulization every 4 (four) hours as needed for wheezing or shortness of breath., Disp: 450 mL, Rfl: 3   albuterol (VENTOLIN HFA) 108 (90 Base) MCG/ACT inhaler, Inhale 2 puffs into the lungs every 4 (four) hours as needed for wheezing or shortness of breath., Disp: 8.5 each, Rfl: 4    aspirin 81 MG EC tablet, Take 1 tablet (81 mg total) by mouth daily. Swallow whole., Disp: 30 tablet, Rfl: 12   Azelastine HCl 137 MCG/SPRAY SOLN, PLACE 2 SPRAYS INTO BOTH NOSTRILS 2 (TWO) TIMES DAILY. USE IN EACH NOSTRIL AS DIRECTED, Disp: 30 mL, Rfl: 3   CALCIUM PO, Take by mouth daily., Disp: , Rfl:    fluticasone (FLONASE) 50 MCG/ACT nasal spray, SPRAY 2 SPRAYS INTO EACH NOSTRIL EVERY DAY, Disp: 48 mL, Rfl: 0   fluticasone furoate-vilanterol (BREO ELLIPTA) 100-25 MCG/ACT AEPB, Inhale 1 puff into the lungs daily., Disp: 30 each, Rfl: 5   methocarbamol (ROBAXIN) 500 MG tablet, Take 1 tablet (500 mg total) by mouth every 8 (eight) hours as needed for muscle spasms., Disp: 60 tablet, Rfl: 0   rosuvastatin (CRESTOR) 10 MG tablet, TAKE 1 TABLET BY MOUTH EVERY DAY, Disp: 90 tablet, Rfl: 0   VITAMIN D PO, Take by mouth daily., Disp: , Rfl:   Allergies  Allergen Reactions   Codeine Rash   Ivp Dye [Iodinated Contrast Media] Other (See Comments)   Lexapro [Escitalopram] Nausea And Vomiting   Penicillins Other (See Comments)    Intolerance     Objective:   BP 124/74   Pulse 74   Temp 98.6 F (37 C)   Ht 5\' 1"  (1.549 m)   Wt 94 lb 3.2 oz (42.7 kg)   SpO2 97%   BMI 17.80 kg/m    Physical Exam Vitals reviewed.  Constitutional:      General: She  is not in acute distress.    Appearance: Normal appearance. She is not ill-appearing, toxic-appearing or diaphoretic.  HENT:     Head: Normocephalic and atraumatic.     Right Ear: Tympanic membrane, ear canal and external ear normal. There is no impacted cerumen.     Left Ear: Tympanic membrane, ear canal and external ear normal. There is no impacted cerumen.     Nose: Congestion present. No rhinorrhea.     Right Sinus: Maxillary sinus tenderness and frontal sinus tenderness present.     Left Sinus: Maxillary sinus tenderness and frontal sinus tenderness present.     Mouth/Throat:     Mouth: Mucous membranes are moist.     Pharynx: Oropharynx  is clear. No oropharyngeal exudate or posterior oropharyngeal erythema.  Eyes:     General: No scleral icterus.       Right eye: No discharge.        Left eye: No discharge.     Conjunctiva/sclera: Conjunctivae normal.  Cardiovascular:     Rate and Rhythm: Normal rate and regular rhythm.     Heart sounds: Normal heart sounds. No murmur heard.   No friction rub. No gallop.  Pulmonary:     Effort: Pulmonary effort is normal. No respiratory distress.     Breath sounds: No stridor. Wheezing present. No rhonchi or rales.  Musculoskeletal:        General: Normal range of motion.     Cervical back: Normal range of motion.  Lymphadenopathy:     Cervical: No cervical adenopathy.  Skin:    General: Skin is warm and dry.     Capillary Refill: Capillary refill takes less than 2 seconds.  Neurological:     General: No focal deficit present.     Mental Status: She is alert and oriented to person, place, and time. Mental status is at baseline.  Psychiatric:        Mood and Affect: Mood normal.        Behavior: Behavior normal.        Thought Content: Thought content normal.        Judgment: Judgment normal.

## 2022-05-19 NOTE — Patient Instructions (Signed)
Jeanne Becker , Thank you for taking time to come for your Medicare Wellness Visit. I appreciate your ongoing commitment to your health goals. Please review the following plan we discussed and let me know if I can assist you in the future.   Screening recommendations/referrals: Colonoscopy: Cologuard done 04/10/2020 - repeat in 3 years Mammogram: Done 04/29/2020 - Suggest repeats every year - I made you an appointment for the mobile unit mammogram at our office 05/25/2022 @ 3:30pm Bone Density: Done 06/26/2021 - Repeat every 2 years  Recommended yearly ophthalmology/optometry visit for glaucoma screening and checkup Recommended yearly dental visit for hygiene and checkup  Vaccinations: Influenza vaccine: Done 09/28/2021 - Repeat annually  Pneumococcal vaccine: Done  08/31/2013 &  07/17/2014 Tdap vaccine: Done 01/29/2014 - Repeat in 10 years Shingles vaccine: Due - Shingrix is 2 doses 2-6 months apart and over 90% effective     Covid-19:Done  09/22/2020 & 10/20/2020  Advanced directives: Advance directive discussed with you today. Even though you declined this today, please call our office should you change your mind, and we can give you the proper paperwork for you to fill out.   Conditions/risks identified: Aim for 30 minutes of exercise or brisk walking, 6-8 glasses of water, and 5 servings of fruits and vegetables each day.   Next appointment: Follow up in one year for your annual wellness visit    Preventive Care 65 Years and Older, Female Preventive care refers to lifestyle choices and visits with your health care provider that can promote health and wellness. What does preventive care include? A yearly physical exam. This is also called an annual well check. Dental exams once or twice a year. Routine eye exams. Ask your health care provider how often you should have your eyes checked. Personal lifestyle choices, including: Daily care of your teeth and gums. Regular physical activity. Eating a  healthy diet. Avoiding tobacco and drug use. Limiting alcohol use. Practicing safe sex. Taking low-dose aspirin every day. Taking vitamin and mineral supplements as recommended by your health care provider. What happens during an annual well check? The services and screenings done by your health care provider during your annual well check will depend on your age, overall health, lifestyle risk factors, and family history of disease. Counseling  Your health care provider may ask you questions about your: Alcohol use. Tobacco use. Drug use. Emotional well-being. Home and relationship well-being. Sexual activity. Eating habits. History of falls. Memory and ability to understand (cognition). Work and work Astronomer. Reproductive health. Screening  You may have the following tests or measurements: Height, weight, and BMI. Blood pressure. Lipid and cholesterol levels. These may be checked every 5 years, or more frequently if you are over 55 years old. Skin check. Lung cancer screening. You may have this screening every year starting at age 44 if you have a 30-pack-year history of smoking and currently smoke or have quit within the past 15 years. Fecal occult blood test (FOBT) of the stool. You may have this test every year starting at age 34. Flexible sigmoidoscopy or colonoscopy. You may have a sigmoidoscopy every 5 years or a colonoscopy every 10 years starting at age 41. Hepatitis C blood test. Hepatitis B blood test. Sexually transmitted disease (STD) testing. Diabetes screening. This is done by checking your blood sugar (glucose) after you have not eaten for a while (fasting). You may have this done every 1-3 years. Bone density scan. This is done to screen for osteoporosis. You may have this  done starting at age 29. Mammogram. This may be done every 1-2 years. Talk to your health care provider about how often you should have regular mammograms. Talk with your health care  provider about your test results, treatment options, and if necessary, the need for more tests. Vaccines  Your health care provider may recommend certain vaccines, such as: Influenza vaccine. This is recommended every year. Tetanus, diphtheria, and acellular pertussis (Tdap, Td) vaccine. You may need a Td booster every 10 years. Zoster vaccine. You may need this after age 23. Pneumococcal 13-valent conjugate (PCV13) vaccine. One dose is recommended after age 60. Pneumococcal polysaccharide (PPSV23) vaccine. One dose is recommended after age 26. Talk to your health care provider about which screenings and vaccines you need and how often you need them. This information is not intended to replace advice given to you by your health care provider. Make sure you discuss any questions you have with your health care provider. Document Released: 01/09/2016 Document Revised: 09/01/2016 Document Reviewed: 10/14/2015 Elsevier Interactive Patient Education  2017 [employer].  Fall Prevention in the Home Falls can cause injuries. They can happen to people of all ages. There are many things you can do to make your home safe and to help prevent falls. What can I do on the outside of my home? Regularly fix the edges of walkways and driveways and fix any cracks. Remove anything that might make you trip as you walk through a door, such as a raised step or threshold. Trim any bushes or trees on the path to your home. Use bright outdoor lighting. Clear any walking paths of anything that might make someone trip, such as rocks or tools. Regularly check to see if handrails are loose or broken. Make sure that both sides of any steps have handrails. Any raised decks and porches should have guardrails on the edges. Have any leaves, snow, or ice cleared regularly. Use sand or salt on walking paths during winter. Clean up any spills in your garage right away. This includes oil or grease spills. What can I do in the  bathroom? Use night lights. Install grab bars by the toilet and in the tub and shower. Do not use towel bars as grab bars. Use non-skid mats or decals in the tub or shower. If you need to sit down in the shower, use a plastic, non-slip stool. Keep the floor dry. Clean up any water that spills on the floor as soon as it happens. Remove soap buildup in the tub or shower regularly. Attach bath mats securely with double-sided non-slip rug tape. Do not have throw rugs and other things on the floor that can make you trip. What can I do in the bedroom? Use night lights. Make sure that you have a light by your bed that is easy to reach. Do not use any sheets or blankets that are too big for your bed. They should not hang down onto the floor. Have a firm chair that has side arms. You can use this for support while you get dressed. Do not have throw rugs and other things on the floor that can make you trip. What can I do in the kitchen? Clean up any spills right away. Avoid walking on wet floors. Keep items that you use a lot in easy-to-reach places. If you need to reach something above you, use a strong step stool that has a grab bar. Keep electrical cords out of the way. Do not use floor polish or wax  that makes floors slippery. If you must use wax, use non-skid floor wax. Do not have throw rugs and other things on the floor that can make you trip. What can I do with my stairs? Do not leave any items on the stairs. Make sure that there are handrails on both sides of the stairs and use them. Fix handrails that are broken or loose. Make sure that handrails are as long as the stairways. Check any carpeting to make sure that it is firmly attached to the stairs. Fix any carpet that is loose or worn. Avoid having throw rugs at the top or bottom of the stairs. If you do have throw rugs, attach them to the floor with carpet tape. Make sure that you have a light switch at the top of the stairs and the  bottom of the stairs. If you do not have them, ask someone to add them for you. What else can I do to help prevent falls? Wear shoes that: Do not have high heels. Have rubber bottoms. Are comfortable and fit you well. Are closed at the toe. Do not wear sandals. If you use a stepladder: Make sure that it is fully opened. Do not climb a closed stepladder. Make sure that both sides of the stepladder are locked into place. Ask someone to hold it for you, if possible. Clearly mark and make sure that you can see: Any grab bars or handrails. First and last steps. Where the edge of each step is. Use tools that help you move around (mobility aids) if they are needed. These include: Canes. Walkers. Scooters. Crutches. Turn on the lights when you go into a dark area. Replace any light bulbs as soon as they burn out. Set up your furniture so you have a clear path. Avoid moving your furniture around. If any of your floors are uneven, fix them. If there are any pets around you, be aware of where they are. Review your medicines with your doctor. Some medicines can make you feel dizzy. This can increase your chance of falling. Ask your doctor what other things that you can do to help prevent falls. This information is not intended to replace advice given to you by your health care provider. Make sure you discuss any questions you have with your health care provider. Document Released: 10/09/2009 Document Revised: 05/20/2016 Document Reviewed: 01/17/2015 Elsevier Interactive Patient Education  2017 [employer].

## 2022-07-12 ENCOUNTER — Encounter: Payer: Self-pay | Admitting: Nurse Practitioner

## 2022-07-12 ENCOUNTER — Ambulatory Visit (INDEPENDENT_AMBULATORY_CARE_PROVIDER_SITE_OTHER): Payer: Medicare Other | Admitting: Nurse Practitioner

## 2022-07-12 VITALS — BP 124/75 | HR 85 | Temp 97.3°F | Ht 61.0 in | Wt 94.4 lb

## 2022-07-12 DIAGNOSIS — J441 Chronic obstructive pulmonary disease with (acute) exacerbation: Secondary | ICD-10-CM | POA: Diagnosis not present

## 2022-07-12 MED ORDER — AZITHROMYCIN 250 MG PO TABS: ORAL_TABLET | ORAL | 0 refills | Status: AC

## 2022-07-12 MED ORDER — PREDNISONE 20 MG PO TABS: 20.0000 mg | ORAL_TABLET | Freq: Every day | ORAL | 0 refills | Status: DC

## 2022-07-12 NOTE — Progress Notes (Signed)
Acute Office Visit  Subjective:     Patient ID: Jeanne Becker, female    DOB: Jul 15, 1956, 66 y.o.   MRN: [id number]  Chief Complaint  Patient presents with   Cough    Cough and congestion    Wheezing  This is a recurrent problem. The problem occurs constantly. The problem has been gradually worsening. Associated symptoms include shortness of breath. Pertinent negatives include no chills, fever, rash or sore throat. The symptoms are aggravated by URIs, smoke and odors. She has tried beta agonist inhalers for the symptoms. The treatment provided no relief. Her past medical history is significant for COPD.    Review of Systems  Constitutional: Negative.  Negative for chills and fever.  HENT: Negative.  Negative for congestion and sore throat.   Respiratory:  Positive for shortness of breath and wheezing.   Cardiovascular: Negative.   Genitourinary: Negative.   Skin: Negative.  Negative for itching and rash.  All other systems reviewed and are negative.       Objective:    BP 124/75   Pulse 85   Temp (!) 97.3 F (36.3 C)   Ht 5\' 1"  (1.549 m)   Wt 94 lb 6.4 oz (42.8 kg)   SpO2 97%   BMI 17.84 kg/m  BP Readings from Last 3 Encounters:  07/12/22 124/75  05/19/22 124/74  04/22/22 125/77   Wt Readings from Last 3 Encounters:  07/12/22 94 lb 6.4 oz (42.8 kg)  05/19/22 94 lb 3.2 oz (42.7 kg)  05/19/22 98 lb (44.5 kg)      Physical Exam Vitals and nursing note reviewed.  Constitutional:      Appearance: Normal appearance.  HENT:     Head: Normocephalic.     Right Ear: External ear normal.     Left Ear: External ear normal.     Nose: Congestion present.     Mouth/Throat:     Mouth: Mucous membranes are moist.  Eyes:     Conjunctiva/sclera: Conjunctivae normal.  Cardiovascular:     Rate and Rhythm: Normal rate.     Pulses: Normal pulses.     Heart sounds: Normal heart sounds.  Pulmonary:     Effort: Pulmonary effort is normal.     Breath sounds: Normal  breath sounds.  Abdominal:     General: Bowel sounds are normal.  Skin:    General: Skin is warm and dry.     Findings: No erythema or rash.  Neurological:     General: No focal deficit present.     Mental Status: She is alert and oriented to person, place, and time.  Psychiatric:        Behavior: Behavior normal.     No results found for any visits on 07/12/22.      Assessment & Plan:  Patient presents with COPD exacerbation and bronchitis, symptoms present for the past few days.  Education provided to patient with printed handouts given on managing COPD exacerbation.  Advised to Take meds as prescribed - Use a cool mist humidifier  -Use saline nose sprays frequently -Force fluids -For fever or aches or pains- take Tylenol or ibuprofen. -Prednisone 20 mg tablet by mouth daily for 6 days. -Azithromycin 500 mg day 1 to 50 mg day 2-5. -If symptoms do not improve, she may need to be COVID tested to rule this out Follow up with worsening unresolved symptoms  Problem List Items Addressed This Visit   None Visit Diagnoses  COPD exacerbation (HCC)    -  Primary   Relevant Medications   azithromycin (ZITHROMAX) 250 MG tablet   predniSONE (DELTASONE) 20 MG tablet       Meds ordered this encounter  Medications   azithromycin (ZITHROMAX) 250 MG tablet    Sig: Take 2 tablets on day 1, then 1 tablet daily on days 2 through 5    Dispense:  6 tablet    Refill:  0    Order Specific Question:   Supervising Provider    Answer:   Standley Brooking   predniSONE (DELTASONE) 20 MG tablet    Sig: Take 1 tablet (20 mg total) by mouth daily with breakfast.    Dispense:  6 tablet    Refill:  0    Order Specific Question:   Supervising Provider    Answer:   Standley Brooking    Return if symptoms worsen or fail to improve.  Daryll Drown, NP

## 2022-07-12 NOTE — Patient Instructions (Signed)
Chronic Obstructive Pulmonary Disease  Chronic obstructive pulmonary disease (COPD) is a long-term (chronic) lung problem. When you have COPD, it is hard for air to get in and out of your lungs. Usually the condition gets worse over time, and your lungs will never return to normal. There are things you can do to keep yourself as healthy as possible. What are the causes? Smoking. This is the most common cause. Certain genes passed from parent to child (inherited). What increases the risk? Being exposed to secondhand smoke from cigarettes, pipes, or cigars. Being exposed to chemicals and other irritants, such as fumes and dust in the work environment. Having chronic lung conditions or infections. What are the signs or symptoms? Shortness of breath, especially during physical activity. A long-term cough with a large amount of thick mucus. Sometimes, the cough may not have any mucus (dry cough). Wheezing. Breathing quickly. Skin that looks gray or blue, especially in the fingers, toes, or lips. Feeling tired (fatigue). Weight loss. Chest tightness. Having infections often. Episodes when breathing symptoms become much worse (exacerbations). At the later stages of this disease, you may have swelling in the ankles, feet, or legs. How is this treated? Taking medicines. Quitting smoking, if you smoke. Rehabilitation. This includes steps to make your body work better. It may involve a team of specialists. Doing exercises. Making changes to your diet. Using oxygen. Lung surgery. Lung transplant. Comfort measures (palliative care). Follow these instructions at home: Medicines Take over-the-counter and prescription medicines only as told by your doctor. Talk to your doctor before taking any cough or allergy medicines. You may need to avoid medicines that cause your lungs to be dry. Lifestyle If you smoke, stop smoking. Smoking makes the problem worse. Do not smoke or use any products that  contain nicotine or tobacco. If you need help quitting, ask your doctor. Avoid being around things that make your breathing worse. This may include smoke, chemicals, and fumes. Stay active, but remember to rest as well. Learn and use tips on how to manage stress and control your breathing. Make sure you get enough sleep. Most adults need at least 7 hours of sleep every night. Eat healthy foods. Eat smaller meals more often. Rest before meals. Controlled breathing Learn and use tips on how to control your breathing as told by your doctor. Try: Breathing in (inhaling) through your nose for 1 second. Then, pucker your lips and breath out (exhale) through your lips for 2 seconds. Putting one hand on your belly (abdomen). Breathe in slowly through your nose for 1 second. Your hand on your belly should move out. Pucker your lips and breathe out slowly through your lips. Your hand on your belly should move in as you breathe out.  Controlled coughing Learn and use controlled coughing to clear mucus from your lungs. Follow these steps: Lean your head a little forward. Breathe in deeply. Try to hold your breath for 3 seconds. Keep your mouth slightly open while coughing 2 times. Spit any mucus out into a tissue. Rest and do the steps again 1 or 2 times as needed. General instructions Make sure you get all the shots (vaccines) that your doctor recommends. Ask your doctor about a flu shot and a pneumonia shot. Use oxygen therapy and pulmonary rehabilitation if told by your doctor. If you need home oxygen therapy, ask your doctor if you should buy a tool to measure your oxygen level (oximeter). Make a COPD action plan with your doctor. This helps you   to know what to do if you feel worse than usual. Manage any other conditions you have as told by your doctor. Avoid going outside when it is very hot, cold, or humid. Avoid people who have a sickness you can catch (contagious). Keep all follow-up  visits. Contact a doctor if: You cough up more mucus than usual. There is a change in the color or thickness of the mucus. It is harder to breathe than usual. Your breathing is faster than usual. You have trouble sleeping. You need to use your medicines more often than usual. You have trouble doing your normal activities such as getting dressed or walking around the house. Get help right away if: You have shortness of breath while resting. You have shortness of breath that stops you from: Being able to talk. Doing normal activities. Your chest hurts for longer than 5 minutes. Your skin color is more blue than usual. Your pulse oximeter shows that you have low oxygen for longer than 5 minutes. You have a fever. You feel too tired to breathe normally. These symptoms may represent a serious problem that is an emergency. Do not wait to see if the symptoms will go away. Get medical help right away. Call your local emergency services (911 in the [place] Do not drive yourself to the hospital. Summary Chronic obstructive pulmonary disease (COPD) is a long-term lung problem. The way your lungs work will never return to normal. Usually the condition gets worse over time. There are things you can do to keep yourself as healthy as possible. Take over-the-counter and prescription medicines only as told by your doctor. If you smoke, stop. Smoking makes the problem worse. This information is not intended to replace advice given to you by your health care provider. Make sure you discuss any questions you have with your health care provider. Document Revised: 10/21/2020 Document Reviewed: 10/21/2020 Elsevier Patient Education  2023 [employer]  

## 2022-07-15 ENCOUNTER — Encounter: Payer: Self-pay | Admitting: Emergency Medicine

## 2022-07-15 ENCOUNTER — Ambulatory Visit: Payer: Medicare Other | Admitting: Emergency Medicine

## 2022-07-15 DIAGNOSIS — J440 Chronic obstructive pulmonary disease with acute lower respiratory infection: Secondary | ICD-10-CM | POA: Diagnosis not present

## 2022-07-15 DIAGNOSIS — J209 Acute bronchitis, unspecified: Secondary | ICD-10-CM | POA: Diagnosis not present

## 2022-07-15 DIAGNOSIS — J449 Chronic obstructive pulmonary disease, unspecified: Secondary | ICD-10-CM | POA: Diagnosis not present

## 2022-07-15 DIAGNOSIS — F17200 Nicotine dependence, unspecified, uncomplicated: Secondary | ICD-10-CM | POA: Diagnosis not present

## 2022-07-15 DIAGNOSIS — J309 Allergic rhinitis, unspecified: Secondary | ICD-10-CM

## 2022-07-15 MED ORDER — FLUTICASONE FUROATE-VILANTEROL 100-25 MCG/ACT IN AEPB: 1.0000 | INHALATION_SPRAY | Freq: Every day | RESPIRATORY_TRACT | 5 refills | Status: DC

## 2022-07-15 MED ORDER — FLUTICASONE FUROATE-VILANTEROL 100-25 MCG/ACT IN AEPB: 1.0000 | INHALATION_SPRAY | Freq: Every day | RESPIRATORY_TRACT | 0 refills | Status: DC

## 2022-07-15 NOTE — Patient Instructions (Signed)
Hold off on taking the prednisone and azithromycin that you were given for now. Please restart your Breo 1 inhalation once daily.  Rinse and gargle after using. Keep your albuterol available to use 2 puffs when you needed for shortness of breath, chest tightness, wheezing. Use your fluticasone (Flonase) nasal spray, 2 sprays each nostril at least once daily (every day on a schedule) Keep your Astelin nasal spray available to use 2 sprays each nostril if you need it for congestion and drainage.  You do not have to take this medication every day if you are not having symptoms Continue to work on decreasing your smoking.  We agreed that you would try to get down to half a pack daily by our next visit. Follow Dr. Delton Coombes in 3 months or sooner if you have problems.

## 2022-07-15 NOTE — Assessment & Plan Note (Signed)
Continues to smoke 1 pack daily.  We talked about trying to cut down.  We set a goal to get down to 10 cigarettes daily.

## 2022-07-15 NOTE — Addendum Note (Signed)
Addended by: Dorisann Frames R on: 07/15/2022 10:28 AM   Modules accepted: Orders

## 2022-07-15 NOTE — Assessment & Plan Note (Signed)
She has been more symptomatic, using albuterol more frequently but no overt wheezing or evidence for an acute flare at this time.  I think we can hold off on the prednisone and azithromycin (she never started these yet because she just obtained them from the pharmacy).  I will get her back on her Breo.  We could consider trying to change to ICS/LABA/LAMA at some point in the future but price has been prohibitive.  Albuterol as needed.  Discussed the importance of smoke cessation with her today

## 2022-07-15 NOTE — Progress Notes (Signed)
Subjective:    Patient ID: Jeanne Becker, female    DOB: [birth year], 66 y.o.   MRN: [id number]  HPI  ROV 02/27/21 --follow-up visit 66 year old woman with moderate to severe COPD.  She continues to smoke.  She has positive bronchodilator response, also allergic rhinitis.  She was having flaring rhinitis symptoms about 1 month ago including small amount of epistaxis.  Managed on Trelegy - but she has not been using reliably, not every day. Uses albuterol about 2-3x day.  Today she reports that she has been doing a bit better since she used decongestant for a week. Has not been on flonase, uses saline nasal spray. Sometimes has nasal dryness. She has some tightness in her chest, happens w activity outside. She likes to work in garden.  She has gotten her COVID shots x 2.  She is smoking a pack/day.  No flares or abx since last time.   ROV 03/17/22 --66 year old woman with a history of tobacco use, continues to smoke.  She has moderate severe COPD with a positive bronchodilator response.  Also allergic rhinitis that significantly impacts her breathing, cough, obstructive lung disease.  She was seen in our office about 1 month ago and setting of acute flare that seem to be related to a viral upper respiratory infection.  Maintenance had been Trelegy, currently on Breo for cost reasons. She is not sure that she likes the powder, or that it works as well. She is having trouble with more nasal congestion, leads to some wheeze. Using albuterol 1-2x a day, does help her.   ROV 07/15/22 --follow-up visit 66 year old woman with a history of continued tobacco use and moderately severe COPD.  She has positive bronchodilator response with an asthmatic component.  Also allergic rhinitis.  As of last visit she has been managed on Breo (had been on Trelegy but insurance and payment issues).  She ran out Burtrum 2 weeks ago.         Uses albuterol approximately 4x a day currently (off the [place]). She has had more  exertional SOB,m wheeze - possibly due to air quality. Has had nasal congestion and then drainage. She is on flonase qd, astelin qd. Xyzal  Recently given prednisone and azithro by her PCP - hasn't started the pred yuet.    She reports  Review of Systems As per HPI     Objective:   Physical Exam Vitals:   07/15/22 0953  BP: 128/72  Pulse: (!) 107  Temp: 98.1 F (36.7 C)  TempSrc: Oral  SpO2: 96%  Weight: 94 lb (42.6 kg)  Height: 5\' 1"  (1.549 m)   Gen: Pleasant, thin woman, in no distress,  normal affect  ENT: No lesions,  mouth clear, no postnasal drip  Neck: No JVD, no stridor  Lungs: No use of accessory muscle, no wheezing.  Very distant.  Few scattered expiratory rhonchi  Cardiovascular: RRR, heart sounds normal, no murmur or gallops, no peripheral edema  Musculoskeletal: No deformities, no cyanosis or clubbing  Neuro: alert, non focal  Skin: Warm, no lesions or rashes      Assessment & Plan:  COPD, moderately severe She has been more symptomatic, using albuterol more frequently but no overt wheezing or evidence for an acute flare at this time.  I think we can hold off on the prednisone and azithromycin (she never started these yet because she just obtained them from the pharmacy).  I will get her back on her Breo.  We could consider  trying to change to ICS/LABA/LAMA at some point in the future but price has been prohibitive.  Albuterol as needed.  Discussed the importance of smoke cessation with her today  Chronic allergic rhinitis Explained portance of using Flonase and Xyzal as her maintenance medications.  She can use Astelin as needed.  Current smoker Continues to smoke 1 pack daily.  We talked about trying to cut down.  We set a goal to get down to 10 cigarettes daily.  Jeanne Pupa, MD, PhD 07/15/2022, 10:20 AM [facility] [phone] or if no answer [phone]

## 2022-07-15 NOTE — Assessment & Plan Note (Signed)
Explained portance of using Flonase and Xyzal as her maintenance medications.  She can use Astelin as needed.

## 2022-08-01 ENCOUNTER — Other Ambulatory Visit: Payer: Self-pay | Admitting: Family Medicine

## 2022-08-01 ENCOUNTER — Other Ambulatory Visit: Payer: Self-pay | Admitting: Emergency Medicine

## 2022-08-01 DIAGNOSIS — E782 Mixed hyperlipidemia: Secondary | ICD-10-CM

## 2022-08-01 DIAGNOSIS — I700 Atherosclerosis of aorta: Secondary | ICD-10-CM

## 2022-08-09 ENCOUNTER — Telehealth: Payer: Self-pay | Admitting: Emergency Medicine

## 2022-08-09 ENCOUNTER — Other Ambulatory Visit: Payer: Self-pay | Admitting: Nurse Practitioner

## 2022-08-09 DIAGNOSIS — R052 Subacute cough: Secondary | ICD-10-CM

## 2022-08-09 MED ORDER — BENZONATATE 200 MG PO CAPS: 200.0000 mg | ORAL_CAPSULE | Freq: Three times a day (TID) | ORAL | 1 refills | Status: DC | PRN

## 2022-08-09 NOTE — Telephone Encounter (Signed)
Called and went over recommendations from [place]. Patient verbalized understanding. Nothing further needed

## 2022-08-09 NOTE — Telephone Encounter (Signed)
Called patient and she states that she has had an increasing cough for two days now. No mucus production noted. No fevers noted just a irritating cough. She is wanting to know if something can be call ed in.  Please advise

## 2022-08-09 NOTE — Telephone Encounter (Signed)
If breathing is stable, she can take OTC delsym 2 tsp Twice daily and I will send tessalon perles for her to take Three times a day as needed for cough. Use albuterol inhaler PRN SOB, wheezing, or coughing spell. Continue Breo as prescribed. If no improvement or symptoms worsen, needs OV for further evaluation. Thanks.

## 2022-08-10 ENCOUNTER — Telehealth: Payer: Self-pay | Admitting: Emergency Medicine

## 2022-08-10 DIAGNOSIS — R052 Subacute cough: Secondary | ICD-10-CM

## 2022-08-10 MED ORDER — BENZONATATE 200 MG PO CAPS: 200.0000 mg | ORAL_CAPSULE | Freq: Three times a day (TID) | ORAL | 1 refills | Status: DC | PRN

## 2022-08-10 NOTE — Telephone Encounter (Signed)
Medication was not sent in for some reason yesterday by Florentina Addison. But I did send it in per [facility] instructions yesterday. Nothing further needed

## 2022-08-15 ENCOUNTER — Other Ambulatory Visit: Payer: Self-pay | Admitting: Family Medicine

## 2022-08-15 DIAGNOSIS — J302 Other seasonal allergic rhinitis: Secondary | ICD-10-CM

## 2022-08-26 ENCOUNTER — Other Ambulatory Visit: Payer: Self-pay | Admitting: Family Medicine

## 2022-08-26 DIAGNOSIS — E782 Mixed hyperlipidemia: Secondary | ICD-10-CM

## 2022-08-26 DIAGNOSIS — I700 Atherosclerosis of aorta: Secondary | ICD-10-CM

## 2022-08-26 NOTE — Telephone Encounter (Signed)
NTBS 30 days given 08/02/22 Britney pt

## 2022-08-26 NOTE — Telephone Encounter (Signed)
Made appointment for Sept 29. Pt has enough meds until then

## 2022-09-03 ENCOUNTER — Encounter: Payer: Self-pay | Admitting: Nurse Practitioner

## 2022-09-03 ENCOUNTER — Telehealth: Payer: Self-pay | Admitting: Nurse Practitioner

## 2022-09-03 ENCOUNTER — Ambulatory Visit (INDEPENDENT_AMBULATORY_CARE_PROVIDER_SITE_OTHER): Payer: Medicare Other | Admitting: Nurse Practitioner

## 2022-09-03 VITALS — BP 136/80 | HR 102 | Temp 98.6°F | Ht 61.0 in | Wt 95.0 lb

## 2022-09-03 DIAGNOSIS — J209 Acute bronchitis, unspecified: Secondary | ICD-10-CM

## 2022-09-03 DIAGNOSIS — H9202 Otalgia, left ear: Secondary | ICD-10-CM

## 2022-09-03 DIAGNOSIS — J440 Chronic obstructive pulmonary disease with acute lower respiratory infection: Secondary | ICD-10-CM

## 2022-09-03 DIAGNOSIS — J029 Acute pharyngitis, unspecified: Secondary | ICD-10-CM | POA: Diagnosis not present

## 2022-09-03 MED ORDER — PREDNISONE 20 MG PO TABS: 20.0000 mg | ORAL_TABLET | Freq: Every day | ORAL | 0 refills | Status: DC

## 2022-09-03 MED ORDER — AZITHROMYCIN 250 MG PO TABS: ORAL_TABLET | ORAL | 0 refills | Status: AC

## 2022-09-03 NOTE — Patient Instructions (Signed)

## 2022-09-03 NOTE — Telephone Encounter (Signed)
Pt called to let Onyeje know that she took a home COVID test and tested negative.

## 2022-09-03 NOTE — Telephone Encounter (Signed)
Okay great, continue symptoms management as discussed in clinic

## 2022-09-03 NOTE — Progress Notes (Signed)
   Acute Office Visit  Subjective:     Patient ID: Jeanne Becker, female    DOB: 1956/10/12, 66 y.o.   MRN: [id number]  Chief Complaint  Patient presents with  . Nasal Congestion  . Cough    Only in the morning   . Ear Pain    Left ear - off and on for a week     HPI Patient is in today for ***  ROS      Objective:    BP 136/80   Pulse (!) 102   Temp 98.6 F (37 C)   Ht 5\' 1"  (1.549 m)   Wt 95 lb (43.1 kg)   SpO2 96%   BMI 17.95 kg/m  BP Readings from Last 3 Encounters:  09/03/22 136/80  07/15/22 128/72  07/12/22 124/75   Wt Readings from Last 3 Encounters:  09/03/22 95 lb (43.1 kg)  07/15/22 94 lb (42.6 kg)  07/12/22 94 lb 6.4 oz (42.8 kg)      Physical Exam  No results found for any visits on 09/03/22.      Assessment & Plan:   Problem List Items Addressed This Visit   None   No orders of the defined types were placed in this encounter.   No follow-ups on file.  11/03/22, NP

## 2022-09-08 ENCOUNTER — Other Ambulatory Visit: Payer: Self-pay | Admitting: Family Medicine

## 2022-09-08 DIAGNOSIS — M542 Cervicalgia: Secondary | ICD-10-CM

## 2022-09-09 NOTE — Telephone Encounter (Signed)
Last OV 04/12/22. Last RF 04/12/22. Next OV 09/24/22 with JE

## 2022-09-10 ENCOUNTER — Telehealth: Payer: Self-pay | Admitting: Family Medicine

## 2022-09-10 DIAGNOSIS — J449 Chronic obstructive pulmonary disease, unspecified: Secondary | ICD-10-CM

## 2022-09-10 MED ORDER — DOXYCYCLINE HYCLATE 100 MG PO TABS: 100.0000 mg | ORAL_TABLET | Freq: Two times a day (BID) | ORAL | 0 refills | Status: DC

## 2022-09-10 NOTE — Telephone Encounter (Signed)
Doxycycline Prescription sent to pharmacy   

## 2022-09-10 NOTE — Telephone Encounter (Signed)
na

## 2022-09-24 ENCOUNTER — Ambulatory Visit: Payer: Medicare Other | Admitting: Nurse Practitioner

## 2022-09-27 ENCOUNTER — Ambulatory Visit: Payer: Medicare Other | Admitting: Nurse Practitioner

## 2022-10-01 ENCOUNTER — Other Ambulatory Visit: Payer: Self-pay | Admitting: Family Medicine

## 2022-10-01 ENCOUNTER — Encounter: Payer: Self-pay | Admitting: Nurse Practitioner

## 2022-10-01 DIAGNOSIS — I700 Atherosclerosis of aorta: Secondary | ICD-10-CM

## 2022-10-01 DIAGNOSIS — E782 Mixed hyperlipidemia: Secondary | ICD-10-CM

## 2022-10-01 NOTE — Telephone Encounter (Signed)
Je NTBS 30 days given 08/02/22

## 2022-10-15 ENCOUNTER — Encounter: Payer: Self-pay | Admitting: Nurse Practitioner

## 2022-10-15 ENCOUNTER — Ambulatory Visit (INDEPENDENT_AMBULATORY_CARE_PROVIDER_SITE_OTHER): Payer: Medicare Other | Admitting: Nurse Practitioner

## 2022-10-15 VITALS — BP 129/69 | HR 110 | Temp 98.2°F | Resp 20 | Ht 61.0 in | Wt 97.0 lb

## 2022-10-15 DIAGNOSIS — J309 Allergic rhinitis, unspecified: Secondary | ICD-10-CM | POA: Diagnosis not present

## 2022-10-15 DIAGNOSIS — H9202 Otalgia, left ear: Secondary | ICD-10-CM | POA: Diagnosis not present

## 2022-10-15 NOTE — Progress Notes (Signed)
Subjective:    Patient ID: Jeanne Becker, female    DOB: [birth year], 66 y.o.   MRN: [id number]   Chief Complaint: stuffy head, Sinus Problem, and Ear Pain (Left ear/)   Pt seen today for sinus problem and L ear pain; she feels that L ear pain has gotten worse over the last week.  Sinus Problem This is a recurrent problem. The current episode started more than 1 month ago. The problem has been gradually worsening since onset. There has been no fever. Her pain is at a severity of 5/10 (L ear pain). Associated symptoms include congestion, coughing, ear pain, a hoarse voice and sinus pressure. Pertinent negatives include no chills, diaphoresis, headaches, neck pain, shortness of breath, sneezing, sore throat or swollen glands. Past treatments include antibiotics and oral decongestants. The treatment provided mild relief.       Review of Systems  Constitutional:  Negative for chills and diaphoresis.  HENT:  Positive for congestion, ear pain, hoarse voice, rhinorrhea, sinus pressure and sinus pain. Negative for ear discharge, hearing loss, sneezing and sore throat.        Intermittent pain behind L ear that radiates down to neck at times; pt unable to describe pain other than "it hurts", rates pain 7/10 when it hurts  Respiratory:  Positive for cough. Negative for chest tightness and shortness of breath.   Cardiovascular:  Negative for chest pain.  Musculoskeletal:  Negative for neck pain.  Neurological:  Negative for headaches.  Hematological:  Negative for adenopathy.  All other systems reviewed and are negative.      Objective:   Physical Exam Vitals and nursing note reviewed.  Constitutional:      General: She is not in acute distress.    Appearance: Normal appearance. She is not ill-appearing.  HENT:     Right Ear: Tympanic membrane, ear canal and external ear normal.     Left Ear: Tympanic membrane, ear canal and external ear normal. No tenderness. No mastoid tenderness.      Ears:     Comments: No tenderness noted with palpation.    Nose: Congestion present. No rhinorrhea.     Mouth/Throat:     Mouth: Mucous membranes are moist.     Pharynx: Oropharynx is clear.  Eyes:     Conjunctiva/sclera: Conjunctivae normal.     Pupils: Pupils are equal, round, and reactive to light.  Cardiovascular:     Rate and Rhythm: Normal rate and regular rhythm.     Pulses: Normal pulses.     Heart sounds: Normal heart sounds.  Pulmonary:     Effort: Pulmonary effort is normal. No respiratory distress.     Breath sounds: Rhonchi present. No wheezing or rales.  Musculoskeletal:     Cervical back: Normal range of motion. No rigidity or tenderness.  Lymphadenopathy:     Head:     Right side of head: No preauricular or posterior auricular adenopathy.     Left side of head: No preauricular or posterior auricular adenopathy.     Cervical: No cervical adenopathy.  Skin:    General: Skin is warm and dry.  Neurological:     General: No focal deficit present.     Mental Status: She is alert and oriented to person, place, and time.  Psychiatric:        Mood and Affect: Mood normal.        Behavior: Behavior normal.      BP 129/69   Pulse Marland Kitchen)  110   Temp 98.2 F (36.8 C) (Temporal)   Resp 20   Ht 5\' 1"  (1.549 m)   Wt 97 lb (44 kg)   SpO2 96%   BMI 18.33 kg/m       Assessment & Plan:   Jeanne Becker in today with chief complaint of stuffy head, Sinus Problem, and Ear Pain (Left ear/)   1. Left ear pain Tylenol PRN ear pain Warm compress if desired  RTO for new or worsening symptoms.  2. Chronic allergic rhinitis Continue Flonase and Xyzal Avoid triggers    The above assessment and management plan was discussed with the patient. The patient verbalized understanding of and has agreed to the management plan. Patient is aware to call the clinic if symptoms persist or worsen. Patient is aware when to return to the clinic for a follow-up visit. Patient  educated on when it is appropriate to go to the emergency department.   Collene Leyden, FNP student  [place], [place]

## 2022-10-17 ENCOUNTER — Other Ambulatory Visit: Payer: Self-pay | Admitting: Family Medicine

## 2022-10-17 DIAGNOSIS — I700 Atherosclerosis of aorta: Secondary | ICD-10-CM

## 2022-10-17 DIAGNOSIS — E782 Mixed hyperlipidemia: Secondary | ICD-10-CM

## 2022-10-18 NOTE — Telephone Encounter (Signed)
Je NTBS 30 days given 08/02/22 

## 2022-10-18 NOTE — Telephone Encounter (Signed)
Called to schedule appt. Patient stated that she had enough for now and she would call back sometime this week to schedule an appt.

## 2022-11-01 ENCOUNTER — Telehealth: Payer: Self-pay | Admitting: Nurse Practitioner

## 2022-11-02 NOTE — Telephone Encounter (Signed)
Attempted to call pt , no answer left vm for cb  

## 2022-11-02 NOTE — Telephone Encounter (Signed)
Hydration, humidifier, neti pot.

## 2022-11-03 DIAGNOSIS — J3089 Other allergic rhinitis: Secondary | ICD-10-CM | POA: Diagnosis not present

## 2022-11-03 DIAGNOSIS — J449 Chronic obstructive pulmonary disease, unspecified: Secondary | ICD-10-CM | POA: Diagnosis not present

## 2022-11-10 ENCOUNTER — Ambulatory Visit: Payer: Medicare Other | Admitting: Emergency Medicine

## 2022-11-10 ENCOUNTER — Encounter: Payer: Self-pay | Admitting: Emergency Medicine

## 2022-11-10 VITALS — BP 120/80 | HR 95 | Temp 98.1°F | Ht 61.0 in | Wt 95.4 lb

## 2022-11-10 DIAGNOSIS — F17200 Nicotine dependence, unspecified, uncomplicated: Secondary | ICD-10-CM

## 2022-11-10 DIAGNOSIS — J309 Allergic rhinitis, unspecified: Secondary | ICD-10-CM | POA: Diagnosis not present

## 2022-11-10 DIAGNOSIS — J449 Chronic obstructive pulmonary disease, unspecified: Secondary | ICD-10-CM | POA: Diagnosis not present

## 2022-11-10 DIAGNOSIS — Z23 Encounter for immunization: Secondary | ICD-10-CM

## 2022-11-10 NOTE — Assessment & Plan Note (Signed)
Congratulations on decreasing your cigarettes!  Keep up the good work.  Our goal is to continue to decrease, ultimately stop altogether.

## 2022-11-10 NOTE — Assessment & Plan Note (Signed)
Continue your Flonase and Astelin nasal sprays as you have been using them. Follow-up with Allergy as planned

## 2022-11-10 NOTE — Patient Instructions (Signed)
Please continue Breo 1 inhalation once daily.  Rinse and gargle after using. We will consider changing your Breo to an alternative going forward.  Try to get a copy of your insurance inhaler formulary so we will know the co-pay on the various medications Keep albuterol available to use 2 puffs to be needed for shortness of breath, chest tightness, wheezing. Flu shot today. Consider getting the COVID-19 booster this fall Congratulations on decreasing your cigarettes!  Keep up the good work.  Our goal is to continue to decrease, ultimately stop altogether. Continue your Flonase and Astelin nasal sprays as you have been using them. Follow-up with Allergy as planned Follow with Dr Delton Coombes in 6 months or sooner if you have any problems

## 2022-11-10 NOTE — Progress Notes (Signed)
Subjective:    Patient ID: Jeanne Becker, female    DOB: [birth year], 66 y.o.   MRN: [id number]  HPI  ROV 07/15/22 --follow-up visit 66 year old woman with a history of continued tobacco use and moderately severe COPD.  She has positive bronchodilator response with an asthmatic component.  Also allergic rhinitis.  As of last visit she has been managed on Breo (had been on Trelegy but insurance and payment issues).  She ran out [place] 2 weeks ago.         Uses albuterol approximately 4x a day currently (off the [place]). She has had more exertional SOB,m wheeze - possibly due to air quality. Has had nasal congestion and then drainage. She is on flonase qd, astelin qd. Xyzal  Recently given prednisone and azithro by her PCP - hasn't started the pred yuet.   ROV 11/10/22 --66 year old woman with a history of moderately severe COPD and continued tobacco use.  She has a positive bronchodilator response.  She also has allergic rhinitis.  She been on Trelegy in the past but had stopped cost reasons, most recently was on Breo in July.  She deals with chronic cough Her most recent lung cancer screening CT scan was 11/25/2020.  She is currently smoking less than a pack a day.  She has Flonase and Astelin.  She is using these reliably. Using an OTC anti-histamine.  She reports that she able to exert, is able to do her daily chores. Her albuterol use is up and down, sometimes as much as 3-4x a day. No pred since last time.  Due for flu shot, wants to defer covid shot.    Review of Systems As per HPI     Objective:   Physical Exam Vitals:   11/10/22 0916  BP: 120/80  Pulse: 95  Temp: 98.1 F (36.7 C)  TempSrc: Oral  SpO2: 99%  Weight: 95 lb 6.4 oz (43.3 kg)  Height: 5\' 1"  (1.549 m)   Gen: Pleasant, thin woman, in no distress,  normal affect  ENT: No lesions,  mouth clear, no postnasal drip  Neck: No JVD, no stridor  Lungs: No use of accessory muscle, no wheezing.  Very distant.  Few scattered  expiratory rhonchi  Cardiovascular: RRR, heart sounds normal, no murmur or gallops, no peripheral edema  Musculoskeletal: No deformities, no cyanosis or clubbing  Neuro: alert, non focal  Skin: Warm, no lesions or rashes      Assessment & Plan:  COPD, moderately severe Good exertional tolerance, no flares.  That said I do believe she would benefit from the addition of LAMA.  Cost has been a problem.  She is going to get her insurance formulary and we will try to find the most cost effective version.  Please continue Breo 1 inhalation once daily.  Rinse and gargle after using. We will consider changing your Breo to an alternative going forward.  Try to get a copy of your insurance inhaler formulary so we will know the co-pay on the various medications Keep albuterol available to use 2 puffs to be needed for shortness of breath, chest tightness, wheezing. Flu shot today. Consider getting the COVID-19 booster this fall Follow with Dr in 6 months or sooner if you have any problems  Chronic allergic rhinitis Continue your Flonase and Astelin nasal sprays as you have been using them. Follow-up with Allergy as planned  Current smoker Congratulations on decreasing your cigarettes!  Keep up the good work.  Our goal is  to continue to decrease, ultimately stop altogether.  Levy Pupa, MD, PhD 11/10/2022, 9:32 AM [facility] [phone] or if no answer [phone]

## 2022-11-10 NOTE — Assessment & Plan Note (Signed)
Good exertional tolerance, no flares.  That said I do believe she would benefit from the addition of LAMA.  Cost has been a problem.  She is going to get her insurance formulary and we will try to find the most cost effective version.  Please continue Breo 1 inhalation once daily.  Rinse and gargle after using. We will consider changing your Breo to an alternative going forward.  Try to get a copy of your insurance inhaler formulary so we will know the co-pay on the various medications Keep albuterol available to use 2 puffs to be needed for shortness of breath, chest tightness, wheezing. Flu shot today. Consider getting the COVID-19 booster this fall Follow with Dr Delton Coombes in 6 months or sooner if you have any problems

## 2022-11-17 NOTE — Telephone Encounter (Signed)
Left message for patient to return call if she still needed [place].

## 2022-12-22 ENCOUNTER — Other Ambulatory Visit: Payer: Self-pay | Admitting: Nurse Practitioner

## 2022-12-22 ENCOUNTER — Telehealth: Payer: Self-pay | Admitting: Nurse Practitioner

## 2022-12-22 DIAGNOSIS — E782 Mixed hyperlipidemia: Secondary | ICD-10-CM

## 2022-12-22 DIAGNOSIS — I700 Atherosclerosis of aorta: Secondary | ICD-10-CM

## 2022-12-22 MED ORDER — ROSUVASTATIN CALCIUM 10 MG PO TABS: 10.0000 mg | ORAL_TABLET | Freq: Every day | ORAL | 1 refills | Status: DC

## 2022-12-22 NOTE — Telephone Encounter (Signed)
  Prescription Request  12/22/2022  Is this a "Controlled Substance" medicine? no  Have you seen your PCP in the last 2 weeks? No pt has appt on 12/31/2022  If YES, route message to pool  -  If NO, patient needs to be scheduled for appointment.  What is the name of the medication or equipment? rosuvastatin (CRESTOR) 10 MG tablet   Have you contacted your pharmacy to request a refill? yes   Which pharmacy would you like this sent to? [facility]    Patient notified that their request is being sent to the clinical staff for review and that they should receive a response within 2 business days.

## 2022-12-31 ENCOUNTER — Encounter: Payer: Self-pay | Admitting: Nurse Practitioner

## 2022-12-31 ENCOUNTER — Ambulatory Visit (INDEPENDENT_AMBULATORY_CARE_PROVIDER_SITE_OTHER): Payer: Medicare Other | Admitting: Nurse Practitioner

## 2022-12-31 VITALS — BP 135/81 | HR 96 | Ht 61.0 in | Wt 96.6 lb

## 2022-12-31 DIAGNOSIS — I700 Atherosclerosis of aorta: Secondary | ICD-10-CM | POA: Diagnosis not present

## 2022-12-31 DIAGNOSIS — F17200 Nicotine dependence, unspecified, uncomplicated: Secondary | ICD-10-CM

## 2022-12-31 DIAGNOSIS — J449 Chronic obstructive pulmonary disease, unspecified: Secondary | ICD-10-CM

## 2022-12-31 DIAGNOSIS — E782 Mixed hyperlipidemia: Secondary | ICD-10-CM | POA: Diagnosis not present

## 2022-12-31 DIAGNOSIS — F17210 Nicotine dependence, cigarettes, uncomplicated: Secondary | ICD-10-CM

## 2022-12-31 MED ORDER — ROSUVASTATIN CALCIUM 10 MG PO TABS: 10.0000 mg | ORAL_TABLET | Freq: Every day | ORAL | 3 refills | Status: DC

## 2022-12-31 NOTE — Assessment & Plan Note (Signed)
Patient knows to continue breathing treatment as prescribed, she is not in any acute distress,.patient is scheduled for Lung CT with pulmonology. Tobacco cessation discussed.

## 2022-12-31 NOTE — Assessment & Plan Note (Signed)
Labs completed results pending.  

## 2022-12-31 NOTE — Patient Instructions (Signed)
Dyslipidemia Dyslipidemia is an imbalance of waxy, fat-like substances (lipids) in the blood. The body needs lipids in small amounts. Dyslipidemia often involves a high level of cholesterol or triglycerides, which are types of lipids. Common forms of dyslipidemia include: High levels of LDL cholesterol. LDL is the type of cholesterol that causes fatty deposits (plaques) to build up in the blood vessels that carry blood away from the heart (arteries). Low levels of HDL cholesterol. HDL cholesterol is the type of cholesterol that protects against heart disease. High levels of HDL remove the LDL buildup from arteries. High levels of triglycerides. Triglycerides are a fatty substance in the blood that is linked to a buildup of plaques in the arteries. What are the causes? There are two main types of dyslipidemia: primary and secondary. Primary dyslipidemia is caused by changes (mutations) in genes that are passed down through families (inherited). These mutations cause several types of dyslipidemia. Secondary dyslipidemia may be caused by various risk factors that can lead to the disease, such as lifestyle choices and certain medical conditions. What increases the risk? You are more likely to develop this condition if you are an older man or if you are a woman who has gone through menopause. Other risk factors include: Having a family history of dyslipidemia. Taking certain medicines, including birth control pills, steroids, some diuretics, and beta-blockers. Eating a diet high in saturated fat. Smoking cigarettes or excessive alcohol intake. Having certain medical conditions such as diabetes, polycystic ovary syndrome (PCOS), kidney disease, liver disease, or hypothyroidism. Not exercising regularly. Being overweight or obese with too much belly fat. What are the signs or symptoms? In most cases, dyslipidemia does not usually cause any symptoms. In severe cases, very high lipid levels can  cause: Fatty bumps under the skin (xanthomas). A white or gray ring around the black center (pupil) of the eye. Very high triglyceride levels can cause inflammation of the pancreas (pancreatitis). How is this diagnosed? Your health care provider may diagnose dyslipidemia based on a routine blood test (fasting blood test). Because most people do not have symptoms of the condition, this blood testing (lipid profile) is done on adults age 20 and older and is repeated every 4-6 years. This test checks: Total cholesterol. This measures the total amount of cholesterol in your blood, including LDL cholesterol, HDL cholesterol, and triglycerides. A healthy number is below 200 mg/dL (5.17 mmol/L). LDL cholesterol. The target number for LDL cholesterol is different for each person, depending on individual risk factors. A healthy number is usually below 100 mg/dL (2.59 mmol/L). Ask your health care provider what your LDL cholesterol should be. HDL cholesterol. An HDL level of 60 mg/dL (1.55 mmol/L) or higher is best because it helps to protect against heart disease. A number below 40 mg/dL (1.03 mmol/L) for men or below 50 mg/dL (1.29 mmol/L) for women increases the risk for heart disease. Triglycerides. A healthy triglyceride number is below 150 mg/dL (1.69 mmol/L). If your lipid profile is abnormal, your health care provider may do other blood tests. How is this treated? Treatment depends on the type of dyslipidemia that you have and your other risk factors for heart disease and stroke. Your health care provider will have a target range for your lipid levels based on this information. Treatment for dyslipidemia starts with lifestyle changes, such as diet and exercise. Your health care provider may recommend that you: Get regular exercise. Make changes to your diet. Quit smoking if you smoke. Limit your alcohol intake. If diet   changes and exercise do not help you reach your goals, your health care provider  may also prescribe medicine to lower lipids. The most commonly prescribed type of medicine lowers your LDL cholesterol (statin drug). If you have a high triglyceride level, your provider may prescribe another type of drug (fibrate) or an omega-3 fish oil supplement, or both. Follow these instructions at home: Eating and drinking  Follow instructions from your health care provider or dietitian about eating or drinking restrictions. Eat a healthy diet as told by your health care provider. This can help you reach and maintain a healthy weight, lower your LDL cholesterol, and raise your HDL cholesterol. This may include: Limiting your calories, if you are overweight. Eating more fruits, vegetables, whole grains, fish, and lean meats. Limiting saturated fat, trans fat, and cholesterol. Do not drink alcohol if: Your health care provider tells you not to drink. You are pregnant, may be pregnant, or are planning to become pregnant. If you drink alcohol: Limit how much you have to: 0-1 drink a day for women. 0-2 drinks a day for men. Know how much alcohol is in your drink. In the [place] one drink equals one 12 oz bottle of beer (355 mL), one 5 oz glass of wine (148 mL), or one 1 oz glass of hard liquor (44 mL). Activity Get regular exercise. Start an exercise and strength training program as told by your health care provider. Ask your health care provider what activities are safe for you. Your health care provider may recommend: 30 minutes of aerobic activity 4-6 days a week. Brisk walking is an example of aerobic activity. Strength training 2 days a week. General instructions Do not use any products that contain nicotine or tobacco. These products include cigarettes, chewing tobacco, and vaping devices, such as e-cigarettes. If you need help quitting, ask your health care provider. Take over-the-counter and prescription medicines only as told by your health care provider. This includes  supplements. Keep all follow-up visits. This is important. Contact a health care provider if: You are having trouble sticking to your exercise or diet plan. You are struggling to quit smoking or to control your use of alcohol. Summary Dyslipidemia often involves a high level of cholesterol or triglycerides, which are types of lipids. Treatment depends on the type of dyslipidemia that you have and your other risk factors for heart disease and stroke. Treatment for dyslipidemia starts with lifestyle changes, such as diet and exercise. Your health care provider may prescribe medicine to lower lipids. This information is not intended to replace advice given to you by your health care provider. Make sure you discuss any questions you have with your health care provider. Document Revised: 07/16/2022 Document Reviewed: 02/16/2021 Elsevier Patient Education  2023 [employer]  

## 2022-12-31 NOTE — Progress Notes (Signed)
Established Patient Office Visit  Subjective   Patient ID: Jeanne Becker, female    DOB: Mar 20, 1956  Age: 67 y.o. MRN: [id number]  Chief Complaint  Patient presents with   Chronic Care Management    HPI  Mixed hyperlipidemia  Pt presents with hyperlipidemia. Patient was diagnosed 09/06/2016. Compliance with treatment has been good. The patient is compliant with medications, maintains a low cholesterol diet , follows up as directed , and maintains an exercise regimen . The patient denies experiencing any hypercholesterolemia related symptoms.   Smoking cessation instruction/counseling given:  counseled patient on the dangers of tobacco use, advised patient to stop smoking, and reviewed strategies to maximize success   Patient Active Problem List   Diagnosis Date Noted   Acute bronchitis with COPD ([place]) 02/17/2022   URI (upper respiratory infection) 02/17/2022   Atherosclerosis of native coronary artery of native heart without angina pectoris 11/18/2021   Osteopenia 06/25/2021   Aortic atherosclerosis ([place]) 01/23/2021   Arthritis 01/23/2021   Hyperlipidemia 09/06/2016   Chronic allergic rhinitis 09/06/2016   Scoliosis 02/14/2016   Spondylosis of thoracolumbar region without myelopathy or radiculopathy 02/14/2016   Current smoker 08/12/2009   COPD, moderately severe 08/11/2009   Past Medical History:  Diagnosis Date   Allergic rhinitis, cause unspecified    Anxiety state, unspecified    Chronic airway obstruction, not elsewhere classified    Depressive disorder, not elsewhere classified    Essential hypertension, benign    Fatigue    H/O diabetes mellitus    Hyperlipidemia    Lower back pain    Menopause    Osteopenia 06/25/2021   Panic disorder without agoraphobia    Seborrheic keratosis    Unspecified arthropathy, shoulder region    Past Surgical History:  Procedure Laterality Date   APPENDECTOMY     BREAST BIOPSY  12/2012   benign   CESAREAN SECTION      TONSILLECTOMY     TUBAL LIGATION     Social History   Tobacco Use   Smoking status: Every Day    Packs/day: 1.50    Years: 30.00    Total pack years: 45.00    Types: Cigarettes    Start date: 1974   Smokeless tobacco: Never   Tobacco comments:    Smoking <1 ppd, trying to cut back.  11/10/2022 hfb  Vaping Use   Vaping Use: Never used  Substance Use Topics   Alcohol use: Not Currently    Alcohol/week: 12.0 standard drinks of alcohol    Types: 12 Cans of beer per week    Comment: 2 drinks/day or fewer   Drug use: Not Currently    Types: Marijuana   Social History   Socioeconomic History   Marital status: Married    Spouse name: Pilar Plate   Number of children: 2   Years of education: Not on file   Highest education level: Not on file  Occupational History   Occupation: retired/disabled  Tobacco Use   Smoking status: Every Day    Packs/day: 1.50    Years: 30.00    Total pack years: 45.00    Types: Cigarettes    Start date: 1974   Smokeless tobacco: Never   Tobacco comments:    Smoking <1 ppd, trying to cut back.  11/10/2022 hfb  Vaping Use   Vaping Use: Never used  Substance and Sexual Activity   Alcohol use: Not Currently    Alcohol/week: 12.0 standard drinks of alcohol    Types:  12 Cans of beer per week    Comment: 2 drinks/day or fewer   Drug use: Not Currently    Types: Marijuana   Sexual activity: Yes    Partners: Male    Birth control/protection: Surgical  Other Topics Concern   Not on file  Social History Narrative   Originally from [place]. Always lived in [place]. 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 Also worked in [employer] She has a dog currently. Remote exposure to love birds. No mold exposure. Husband and granddaughter live with her. She has 2 sons.   Social Determinants of Health   Financial Resource Strain: Low Risk  (05/19/2022)   Overall Financial Resource Strain (CARDIA)    Difficulty of Paying Living Expenses: Not hard  at all  Food Insecurity: No Food Insecurity (05/19/2022)   Hunger Vital Sign    Worried About Running Out of Food in the Last Year: Never true    Ran Out of Food in the Last Year: Never true  Transportation Needs: No Transportation Needs (05/19/2022)   PRAPARE - Hydrologist (Medical): No    Lack of Transportation (Non-Medical): No  Physical Activity: Insufficiently Active (05/19/2022)   Exercise Vital Sign    Days of Exercise per Week: 7 days    Minutes of Exercise per Session: 20 min  Stress: Stress Concern Present (05/19/2022)   [place]    Feeling of Stress : To some extent  Social Connections: Moderately Integrated (05/19/2022)   Social Connection and Isolation Panel [NHANES]    Frequency of Communication with Friends and Family: More than three times a week    Frequency of Social Gatherings with Friends and Family: Twice a week    Attends Religious Services: Never    Marine scientist or Organizations: Yes    Attends Archivist Meetings: 1 to 4 times per year    Marital Status: Married  Human resources officer Violence: Not At Risk (05/19/2022)   Humiliation, Afraid, Rape, and Kick questionnaire    Fear of Current or Ex-Partner: No    Emotionally Abused: No    Physically Abused: No    Sexually Abused: No   Family Status  Relation Name Status   Other  (Not Specified)   Other  (Not Specified)   Other  (Not Specified)   Other  (Not Specified)   Other  (Not Specified)   Other  (Not Specified)   Other  (Not Specified)   Other  (Not Specified)   Mother  Deceased   Son 2 Alive   Father  Deceased   Brother  Deceased   MGM  Deceased   MGF  Deceased   PGM  Deceased   PGF  Deceased   Family History  Problem Relation Age of Onset   Cancer Other    Allergies Other    Diabetes Other    Heart disease Other    Hypertension Other    Migraines Other    Osteoporosis Other     Seizures Other    COPD Mother    Heart disease Mother    Asthma Son    Stroke Father    Heart attack Brother    Aneurysm Brother    Throat cancer Maternal Grandfather    Diabetes Paternal Grandmother    Heart attack Paternal Grandmother    Pneumonia Paternal Grandfather    Allergies  Allergen Reactions   Codeine  Rash   Ivp Dye [Iodinated Contrast Media] Other (See Comments)   Lexapro [Escitalopram] Nausea And Vomiting   Penicillins Other (See Comments)    Intolerance       Review of Systems  Constitutional: Negative.   HENT: Negative.    Eyes: Negative.   Respiratory:  Positive for wheezing.        Hx of copd  Cardiovascular: Negative.   Gastrointestinal: Negative.   Genitourinary: Negative.   Skin: Negative.  Negative for itching and rash.  Neurological: Negative.   All other systems reviewed and are negative.     Objective:     BP 135/81   Pulse 96   Ht 5\' 1"  (1.549 m)   Wt 96 lb 9.6 oz (43.8 kg)   SpO2 100%   BMI 18.25 kg/m  BP Readings from Last 3 Encounters:  12/31/22 135/81  11/10/22 120/80  10/15/22 129/69   Wt Readings from Last 3 Encounters:  12/31/22 96 lb 9.6 oz (43.8 kg)  11/10/22 95 lb 6.4 oz (43.3 kg)  10/15/22 97 lb (44 kg)      Physical Exam Vitals and nursing note reviewed.  Constitutional:      Appearance: Normal appearance.  HENT:     Right Ear: External ear normal.     Left Ear: External ear normal.     Nose: Nose normal.     Mouth/Throat:     Mouth: Mucous membranes are moist.     Pharynx: Oropharynx is clear.  Eyes:     Conjunctiva/sclera: Conjunctivae normal.  Cardiovascular:     Pulses: Normal pulses.     Heart sounds: Normal heart sounds.  Pulmonary:     Breath sounds: Wheezing present.  Abdominal:     General: Bowel sounds are normal.  Musculoskeletal:        General: Normal range of motion.  Skin:    General: Skin is dry.     Findings: No erythema or rash.  Neurological:     General: No focal deficit  present.     Mental Status: She is alert and oriented to person, place, and time.  Psychiatric:        Mood and Affect: Mood normal.        Behavior: Behavior normal.      No results found for any visits on 12/31/22.  Last CBC Lab Results  Component Value Date   WBC 5.5 05/15/2021   HGB 15.1 05/15/2021   HCT 43.5 05/15/2021   MCV 101 (H) 05/15/2021   MCH 35.0 (H) 05/15/2021   RDW 11.5 (L) 05/15/2021   PLT 187 81/19/1478   Last metabolic panel Lab Results  Component Value Date   GLUCOSE 116 (H) 05/15/2021   NA 142 05/15/2021   K 3.8 05/15/2021   CL 102 05/15/2021   CO2 24 05/15/2021   BUN 9 05/15/2021   CREATININE 0.92 05/15/2021   EGFR 69 05/15/2021   CALCIUM 9.1 05/15/2021   PROT 6.1 05/15/2021   ALBUMIN 4.4 05/15/2021   LABGLOB 1.7 05/15/2021   AGRATIO 2.6 (H) 05/15/2021   BILITOT 0.3 05/15/2021   ALKPHOS 64 05/15/2021   AST 20 05/15/2021   ALT 15 05/15/2021   Last lipids Lab Results  Component Value Date   CHOL 156 05/15/2021   HDL 51 05/15/2021   LDLCALC 80 05/15/2021   TRIG 142 05/15/2021   CHOLHDL 3.1 05/15/2021   Last hemoglobin A1c Lab Results  Component Value Date   HGBA1C 5.3 03/19/2020   Last  thyroid functions No results found for: "TSH", "T3TOTAL", "T4TOTAL", "THYROIDAB" Last vitamin D No results found for: "25OHVITD2", "25OHVITD3", "VD25OH" Last vitamin B12 and Folate No results found for: "VITAMINB12", "FOLATE"    The 10-year ASCVD risk score (Arnett DK, et al., 2019) is: 20.4%    Assessment & Plan:   Problem List Items Addressed This Visit       Cardiovascular and Mediastinum   Aortic atherosclerosis ([place])   Relevant Medications   rosuvastatin (CRESTOR) 10 MG tablet     Respiratory   COPD, moderately severe    Patient knows to continue breathing treatment as prescribed, she is not in any acute distress,.patient is scheduled for Lung CT with pulmonology. Tobacco cessation discussed.         Other   Current smoker -  Primary    Smoking cessation discussed, patient is not ready to quit. She is eligable for low grade lung CT. Patient states she has bee scheduled for CT by pulmonologist.       Hyperlipidemia    Labs completed results pending.       Relevant Medications   rosuvastatin (CRESTOR) 10 MG tablet   Other Relevant Orders   CBC with Differential   CMP14+EGFR   Lipid Panel    Return in about 6 months (around 07/01/2023) for chronic disease management.    Ivy Lynn, NP

## 2022-12-31 NOTE — Assessment & Plan Note (Signed)
Smoking cessation discussed, patient is not ready to quit. She is eligable for low grade lung CT. Patient states she has bee scheduled for CT by pulmonologist.

## 2023-01-01 LAB — CMP14+EGFR
ALT: 24 IU/L (ref 0–32)
AST: 29 IU/L (ref 0–40)
Albumin/Globulin Ratio: 2 (ref 1.2–2.2)
Albumin: 4.3 g/dL (ref 3.9–4.9)
Alkaline Phosphatase: 55 IU/L (ref 44–121)
BUN/Creatinine Ratio: 13 (ref 12–28)
BUN: 9 mg/dL (ref 8–27)
Bilirubin Total: 0.4 mg/dL (ref 0.0–1.2)
CO2: 24 mmol/L (ref 20–29)
Calcium: 9.4 mg/dL (ref 8.7–10.3)
Chloride: 102 mmol/L (ref 96–106)
Creatinine, Ser: 0.7 mg/dL (ref 0.57–1.00)
Globulin, Total: 2.2 g/dL (ref 1.5–4.5)
Glucose: 112 mg/dL — ABNORMAL HIGH (ref 70–99)
Potassium: 4.3 mmol/L (ref 3.5–5.2)
Sodium: 140 mmol/L (ref 134–144)
Total Protein: 6.5 g/dL (ref 6.0–8.5)
eGFR: 95 mL/min/{1.73_m2} (ref >59)

## 2023-01-01 LAB — CBC WITH DIFFERENTIAL/PLATELET
Basophils Absolute: 0 10*3/uL (ref 0.0–0.2)
Basos: 1 %
EOS (ABSOLUTE): 0.1 10*3/uL (ref 0.0–0.4)
Eos: 2 %
Hematocrit: 44.6 % (ref 34.0–46.6)
Hemoglobin: 15.2 g/dL (ref 11.1–15.9)
Immature Grans (Abs): 0 10*3/uL (ref 0.0–0.1)
Immature Granulocytes: 0 %
Lymphocytes Absolute: 1.1 10*3/uL (ref 0.7–3.1)
Lymphs: 23 %
MCH: 35.1 pg — ABNORMAL HIGH (ref 26.6–33.0)
MCHC: 34.1 g/dL (ref 31.5–35.7)
MCV: 103 fL — ABNORMAL HIGH (ref 79–97)
Monocytes Absolute: 0.5 10*3/uL (ref 0.1–0.9)
Monocytes: 10 %
Neutrophils Absolute: 3 10*3/uL (ref 1.4–7.0)
Neutrophils: 64 %
Platelets: 176 10*3/uL (ref 150–450)
RBC: 4.33 x10E6/uL (ref 3.77–5.28)
RDW: 11.3 % — ABNORMAL LOW (ref 11.7–15.4)
WBC: 4.7 10*3/uL (ref 3.4–10.8)

## 2023-01-01 LAB — LIPID PANEL
Chol/HDL Ratio: 2.4 ratio (ref 0.0–4.4)
Cholesterol, Total: 156 mg/dL (ref 100–199)
HDL: 65 mg/dL (ref >39)
LDL Chol Calc (NIH): 64 mg/dL (ref 0–99)
Triglycerides: 163 mg/dL — ABNORMAL HIGH (ref 0–149)
VLDL Cholesterol Cal: 27 mg/dL (ref 5–40)

## 2023-01-02 ENCOUNTER — Other Ambulatory Visit: Payer: Self-pay | Admitting: Nurse Practitioner

## 2023-01-02 DIAGNOSIS — E781 Pure hyperglyceridemia: Secondary | ICD-10-CM

## 2023-01-02 MED ORDER — OMEGA-3 FATTY ACIDS 1000 MG PO CAPS: 2.0000 g | ORAL_CAPSULE | Freq: Every day | ORAL | 1 refills | Status: AC

## 2023-01-21 ENCOUNTER — Telehealth: Payer: Self-pay | Admitting: Nurse Practitioner

## 2023-01-21 NOTE — Telephone Encounter (Signed)
Reviewed labs with patient and she verbalized understanding.

## 2023-02-10 ENCOUNTER — Telehealth: Payer: Self-pay | Admitting: Emergency Medicine

## 2023-02-10 NOTE — Telephone Encounter (Signed)
I agree with stopping Breo and starting trelegy. Thank you

## 2023-02-10 NOTE — Telephone Encounter (Signed)
Called and spoke with patient. Patient stated that she wanted to go back on trelegy and that Dr. Lamonte Sakai had told her that she needed to be back on the trelegy. Advised patient I would notify Dr. Lamonte Sakai to make sure it is okay and if it is we will send it in to her pharmacy. Patient verified pharmacy.   RB, please advise.

## 2023-02-11 ENCOUNTER — Telehealth (INDEPENDENT_AMBULATORY_CARE_PROVIDER_SITE_OTHER): Payer: Medicare Other | Admitting: Family Medicine

## 2023-02-11 ENCOUNTER — Encounter: Payer: Self-pay | Admitting: Family Medicine

## 2023-02-11 DIAGNOSIS — J441 Chronic obstructive pulmonary disease with (acute) exacerbation: Secondary | ICD-10-CM

## 2023-02-11 DIAGNOSIS — J0141 Acute recurrent pansinusitis: Secondary | ICD-10-CM

## 2023-02-11 MED ORDER — PREDNISONE 20 MG PO TABS: 40.0000 mg | ORAL_TABLET | Freq: Every day | ORAL | 0 refills | Status: AC

## 2023-02-11 MED ORDER — DOXYCYCLINE HYCLATE 100 MG PO TABS: 100.0000 mg | ORAL_TABLET | Freq: Two times a day (BID) | ORAL | 0 refills | Status: AC

## 2023-02-11 NOTE — Progress Notes (Signed)
Virtual Visit via Telephone Note Due to COVID-19 pandemic this visit was conducted virtually. This visit type was conducted due to national recommendations for restrictions regarding the COVID-19 Pandemic (e.g. social distancing, sheltering in place) in an effort to limit this patient's exposure and mitigate transmission in our community. All issues noted in this document were discussed and addressed.  A physical exam was not performed with this format.   I connected with Jeanne Becker on 02/11/2023 at 1515 by Telephone and verified that I am speaking with the correct person using two identifiers. Jeanne Becker is currently located at home and family (husband) is currently with them during visit. The provider, Donzetta Kohut, FNP is located in their office at time of visit.  I discussed the limitations, risks, security and privacy concerns of performing an evaluation and management service by virtual visit and the availability of in person appointments. I also discussed with the patient that there may be a patient responsible charge related to this service. The patient expressed understanding and agreed to proceed.  Subjective:  Patient ID: Jeanne Becker, female    DOB: 02/06/1956, 67 y.o.   MRN: [id number]  Chief Complaint:  Cough   HPI: Jeanne Becker is a 67 y.o. female presenting on 02/11/2023 for cough   HPI Symptoms started about a month ago, but have gotten worse this week.  Sees [facility] for pulmonology, who sent her for allergy testing and tested Pt has allergies to dust. Since seeing allergist was taking OTC chlorpheniramine, which was helping at first but now is not helping at this time. States that she is coughing a lot, productive with clear sputum. Endorses Congestion. Complaints of pain on the left side of her ear and neck. Chest tightness with coughing.   Relevant past medical, surgical, family, and social history reviewed and updated as indicated.  Allergies and  medications reviewed and updated.   Past Medical History:  Diagnosis Date   Allergic rhinitis, cause unspecified    Anxiety state, unspecified    Chronic airway obstruction, not elsewhere classified    Depressive disorder, not elsewhere classified    Essential hypertension, benign    Fatigue    H/O diabetes mellitus    Hyperlipidemia    Lower back pain    Menopause    Osteopenia 06/25/2021   Panic disorder without agoraphobia    Seborrheic keratosis    Unspecified arthropathy, shoulder region     Past Surgical History:  Procedure Laterality Date   APPENDECTOMY     BREAST BIOPSY  12/2012   benign   CESAREAN SECTION     TONSILLECTOMY     TUBAL LIGATION      Social History   Socioeconomic History   Marital status: Married    Spouse name: Pilar Plate   Number of children: 2   Years of education: Not on file   Highest education level: Not on file  Occupational History   Occupation: retired/disabled  Tobacco Use   Smoking status: Every Day    Packs/day: 1.50    Years: 30.00    Total pack years: 45.00    Types: Cigarettes    Start date: 1974   Smokeless tobacco: Never   Tobacco comments:    Smoking <1 ppd, trying to cut back.  11/10/2022 hfb  Vaping Use   Vaping Use: Never used  Substance and Sexual Activity   Alcohol use: Not Currently    Alcohol/week: 12.0 standard drinks of alcohol    Types:  12 Cans of beer per week    Comment: 2 drinks/day or fewer   Drug use: Not Currently    Types: Marijuana   Sexual activity: Yes    Partners: Male    Birth control/protection: Surgical  Other Topics Concern   Not on file  Social History Narrative   Originally from [place]. Always lived in [place]. 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 Also worked in [employer] She has a dog currently. Remote exposure to love birds. No mold exposure. Husband and granddaughter live with her. She has 2 sons.   Social Determinants of Health   Financial Resource Strain: Low  Risk  (05/19/2022)   Overall Financial Resource Strain (CARDIA)    Difficulty of Paying Living Expenses: Not hard at all  Food Insecurity: No Food Insecurity (05/19/2022)   Hunger Vital Sign    Worried About Running Out of Food in the Last Year: Never true    Ran Out of Food in the Last Year: Never true  Transportation Needs: No Transportation Needs (05/19/2022)   PRAPARE - Hydrologist (Medical): No    Lack of Transportation (Non-Medical): No  Physical Activity: Insufficiently Active (05/19/2022)   Exercise Vital Sign    Days of Exercise per Week: 7 days    Minutes of Exercise per Session: 20 min  Stress: Stress Concern Present (05/19/2022)   [place]    Feeling of Stress : To some extent  Social Connections: Moderately Integrated (05/19/2022)   Social Connection and Isolation Panel [NHANES]    Frequency of Communication with Friends and Family: More than three times a week    Frequency of Social Gatherings with Friends and Family: Twice a week    Attends Religious Services: Never    Marine scientist or Organizations: Yes    Attends Archivist Meetings: 1 to 4 times per year    Marital Status: Married  Human resources officer Violence: Not At Risk (05/19/2022)   Humiliation, Afraid, Rape, and Kick questionnaire    Fear of Current or Ex-Partner: No    Emotionally Abused: No    Physically Abused: No    Sexually Abused: No    Outpatient Encounter Medications as of 02/11/2023  Medication Sig   acetaminophen (TYLENOL) 325 MG tablet Take 650 mg by mouth every 6 (six) hours as needed.   albuterol (PROVENTIL) (2.5 MG/3ML) 0.083% nebulizer solution Take 3 mLs (2.5 mg total) by nebulization every 4 (four) hours as needed for wheezing or shortness of breath.   albuterol (VENTOLIN HFA) 108 (90 Base) MCG/ACT inhaler INHALE 2 PUFFS INTO THE LUNGS EVERY 4 HOURS AS NEEDED FOR WHEEZING OR SHORTNESS  OF BREATH.   aspirin 81 MG EC tablet Take 1 tablet (81 mg total) by mouth daily. Swallow whole.   Azelastine HCl 137 MCG/SPRAY SOLN PLACE 2 SPRAYS INTO BOTH NOSTRILS 2 (TWO) TIMES DAILY. USE IN EACH NOSTRIL AS DIRECTED   CALCIUM PO Take by mouth daily.   cetirizine (ZYRTEC) 10 MG tablet Take 10 mg by mouth daily.   fish oil-omega-3 fatty acids 1000 MG capsule Take 2 capsules (2 g total) by mouth daily.   fluticasone (FLONASE) 50 MCG/ACT nasal spray SPRAY 2 SPRAYS INTO EACH NOSTRIL EVERY DAY   fluticasone furoate-vilanterol (BREO ELLIPTA) 100-25 MCG/ACT AEPB Inhale 1 puff into the lungs daily.   rosuvastatin (CRESTOR) 10 MG tablet Take 1 tablet (10 mg total) by mouth daily. (  NEEDS TO BE SEEN BEFORE NEXT REFILL)   VITAMIN D PO Take by mouth daily.   No facility-administered encounter medications on file as of 02/11/2023.    Allergies  Allergen Reactions   Codeine Rash   Ivp Dye [Iodinated Contrast Media] Other (See Comments)   Lexapro [Escitalopram] Nausea And Vomiting   Penicillins Other (See Comments)    Intolerance     Review of Systems  Constitutional:  Positive for activity change and fatigue.  HENT:  Positive for congestion, ear pain, rhinorrhea, sinus pressure and sinus pain.   Eyes: Negative.   Respiratory:  Positive for chest tightness and shortness of breath.   Cardiovascular:  Positive for chest pain (tightness with coughing).  Gastrointestinal:  Negative for nausea and vomiting.  Genitourinary: Negative.   Musculoskeletal:  Positive for myalgias.  Allergic/Immunologic: Positive for environmental allergies.  Neurological:  Positive for headaches. Negative for dizziness, seizures, syncope, facial asymmetry, speech difficulty, light-headedness and numbness.  Hematological: Negative.   Psychiatric/Behavioral: Negative.          Observations/Objective: No vital signs or physical exam, this was a virtual health encounter.  Pt alert and oriented, answers all questions  appropriately, and able to speak in full sentences. Sounds congested on the phone.    Assessment and Plan: 1. COPD exacerbation ([place]) 2. Acute recurrent pansinusitis - doxycycline (VIBRA-TABS) 100 MG tablet; Take 1 tablet (100 mg total) by mouth 2 (two) times daily for 7 days.  Dispense: 14 tablet; Refill: 0 - predniSONE (DELTASONE) 20 MG tablet; Take 2 tablets (40 mg total) by mouth daily with breakfast for 5 days.  Dispense: 10 tablet; Refill: 0 Medications as above. Per pt, she is restarting Trelegy as well. Given COPD and length of illness will treat for COPD exacerbation as above. Pt educated that she can receive testing for viral illnesses, but it would not change management at this time.    Follow Up Instructions:     I discussed the assessment and treatment plan with the patient. The patient was provided an opportunity to ask questions and all were answered. The patient agreed with the plan and demonstrated an understanding of the instructions.   The patient was advised to call back or seek an in-person evaluation if the symptoms worsen or if the condition fails to improve as anticipated.  The above assessment and management plan was discussed with the patient. The patient verbalized understanding of and has agreed to the management plan. Patient is aware to call the clinic if they develop any new symptoms or if symptoms persist or worsen. Patient is aware when to return to the clinic for a follow-up visit. Patient educated on when it is appropriate to go to the emergency department.    I provided 11 minutes of time during this telephone encounter.   Donzetta Kohut, FNP-C [facility] [address] [place], [place] [address] [phone] 02/11/2023

## 2023-02-11 NOTE — Telephone Encounter (Signed)
Attempted to call pt but unable to reach. Left message to return call.  

## 2023-02-11 NOTE — Telephone Encounter (Signed)
ATC X1 lvm for patient to call back. Please advise Dr. Lamonte Sakai okay 'ed her starting back on Trelegy, need to verify pharmacy to send in refills

## 2023-02-14 MED ORDER — TRELEGY ELLIPTA 100-62.5-25 MCG/ACT IN AEPB: 1.0000 | INHALATION_SPRAY | Freq: Every day | RESPIRATORY_TRACT | 0 refills | Status: DC

## 2023-02-14 NOTE — Telephone Encounter (Signed)
pt. called want meds to go to [facility] please call back to let her know the med was called in

## 2023-02-14 NOTE — Telephone Encounter (Signed)
Called and spoke with patient. She verbalized understanding. Patient verified pharmacy. Rx for trelegy has been sent to the patients pharmacy.   Nothing further needed.

## 2023-03-11 ENCOUNTER — Encounter: Payer: Self-pay | Admitting: Nurse Practitioner

## 2023-03-11 ENCOUNTER — Ambulatory Visit (INDEPENDENT_AMBULATORY_CARE_PROVIDER_SITE_OTHER): Payer: Medicare Other | Admitting: Nurse Practitioner

## 2023-03-11 VITALS — BP 132/72 | HR 101 | Temp 97.7°F | Resp 20 | Ht 61.0 in | Wt 100.0 lb

## 2023-03-11 DIAGNOSIS — H6992 Unspecified Eustachian tube disorder, left ear: Secondary | ICD-10-CM | POA: Diagnosis not present

## 2023-03-11 MED ORDER — FLUTICASONE PROPIONATE 50 MCG/ACT NA SUSP: 2.0000 | Freq: Every day | NASAL | 6 refills | Status: AC

## 2023-03-11 NOTE — Progress Notes (Signed)
   Subjective:    Patient ID: Jeanne Becker, female    DOB: Dec 02, 1956, 67 y.o.   MRN: [id number]   Chief Complaint: earpain  Otalgia  There is pain in the left ear. This is a new problem. The current episode started yesterday. The problem occurs hourly. The problem has been gradually worsening. There has been no fever. The fever has been present for Less than 1 day. The pain is at a severity of 7/10. The pain is mild. Associated symptoms include headaches. Pertinent negatives include no coughing. She has tried acetaminophen for the symptoms. The treatment provided mild relief.    Patient Active Problem List   Diagnosis Date Noted   Acute bronchitis with COPD ([place]) 02/17/2022   URI (upper respiratory infection) 02/17/2022   Atherosclerosis of native coronary artery of native heart without angina pectoris 11/18/2021   Osteopenia 06/25/2021   Aortic atherosclerosis ([place]) 01/23/2021   Arthritis 01/23/2021   Hyperlipidemia 09/06/2016   Chronic allergic rhinitis 09/06/2016   Scoliosis 02/14/2016   Spondylosis of thoracolumbar region without myelopathy or radiculopathy 02/14/2016   Current smoker 08/12/2009   COPD, moderately severe 08/11/2009        Review of Systems  HENT:  Positive for ear pain.   Respiratory:  Negative for cough.   Neurological:  Positive for headaches.       Objective:   Physical Exam Vitals reviewed.  Constitutional:      Appearance: Normal appearance.  HENT:     Right Ear: Hearing, tympanic membrane and ear canal normal. No middle ear effusion.     Left Ear: Hearing and ear canal normal. A middle ear effusion is present.     Nose: No congestion or rhinorrhea.  Cardiovascular:     Rate and Rhythm: Normal rate and regular rhythm.     Heart sounds: Normal heart sounds.  Pulmonary:     Effort: Pulmonary effort is normal.     Breath sounds: Normal breath sounds.  Skin:    General: Skin is warm.  Neurological:     General: No focal deficit  present.     Mental Status: She is alert and oriented to person, place, and time.  Psychiatric:        Mood and Affect: Mood normal.        Behavior: Behavior normal.    BP 132/72   Pulse (!) 101   Temp 97.7 F (36.5 C) (Temporal)   Resp 20   Ht 5\' 1"  (1.549 m)   Wt 100 lb (45.4 kg)   SpO2 99%   BMI 18.89 kg/m         Assessment & Plan:   Jeanne Becker in today with chief complaint of Otalgia   1. Dysfunction of left eustachian tube Force fluids [place] d Flonase RTO prn    The above assessment and management plan was discussed with the patient. The patient verbalized understanding of and has agreed to the management plan. Patient is aware to call the clinic if symptoms persist or worsen. Patient is aware when to return to the clinic for a follow-up visit. Patient educated on when it is appropriate to go to the emergency department.   Mary-Margaret Hassell Done, FNP

## 2023-03-11 NOTE — Patient Instructions (Signed)
Eustachian Tube Dysfunction  Eustachian tube dysfunction refers to a condition in which a blockage develops in the narrow passage that connects the middle ear to the back of the nose (eustachian tube). The eustachian tube regulates air pressure in the middle ear by letting air move between the ear and nose. It also helps to drain fluid from the middle ear space. Eustachian tube dysfunction can affect one or both ears. When the eustachian tube does not function properly, air pressure, fluid, or both can build up in the middle ear. What are the causes? This condition occurs when the eustachian tube becomes blocked or cannot open normally. Common causes of this condition include: Ear infections. Colds and other infections that affect the nose, mouth, and throat (upper respiratory tract). Allergies. Irritation from cigarette smoke. Irritation from stomach acid coming up into the esophagus (gastroesophageal reflux). The esophagus is the part of the body that moves food from the mouth to the stomach. Sudden changes in air pressure, such as from descending in an airplane or scuba diving. Abnormal growths in the nose or throat, such as: Growths that line the nose (nasal polyps). Abnormal growth of cells (tumors). Enlarged tissue at the back of the throat (adenoids). What increases the risk? You are more likely to develop this condition if: You smoke. You are overweight. You are a child who has: Certain birth defects of the mouth, such as cleft palate. Large tonsils or adenoids. What are the signs or symptoms? Common symptoms of this condition include: A feeling of fullness in the ear. Ear pain. Clicking or popping noises in the ear. Ringing in the ear (tinnitus). Hearing loss. Loss of balance. Dizziness. Symptoms may get worse when the air pressure around you changes, such as when you travel to an area of high elevation, fly on an airplane, or go scuba diving. How is this diagnosed? This  condition may be diagnosed based on: Your symptoms. A physical exam of your ears, nose, and throat. Tests, such as those that measure: The movement of your eardrum. Your hearing (audiometry). How is this treated? Treatment depends on the cause and severity of your condition. In mild cases, you may relieve your symptoms by moving air into your ears. This is called "popping the ears." In more severe cases, or if you have symptoms of fluid in your ears, treatment may include: Medicines to relieve congestion (decongestants). Medicines that treat allergies (antihistamines). Nasal sprays or ear drops that contain medicines that reduce swelling (steroids). A procedure to drain the fluid in your eardrum. In this procedure, a small tube may be placed in the eardrum to: Drain the fluid. Restore the air in the middle ear space. A procedure to insert a balloon device through the nose to inflate the opening of the eustachian tube (balloon dilation). Follow these instructions at home: Lifestyle Do not do any of the following until your health care provider approves: Travel to high altitudes. Fly in airplanes. Work in a pressurized cabin or room. Scuba dive. Do not use any products that contain nicotine or tobacco. These products include cigarettes, chewing tobacco, and vaping devices, such as e-cigarettes. If you need help quitting, ask your health care provider. Keep your ears dry. Wear fitted earplugs during showering and bathing. Dry your ears completely after. General instructions Take over-the-counter and prescription medicines only as told by your health care provider. Use techniques to help pop your ears as recommended by your health care provider. These may include: Chewing gum. Yawning. Frequent, forceful swallowing.   Closing your mouth, holding your nose closed, and gently blowing as if you are trying to blow air out of your nose. Keep all follow-up visits. This is important. Contact a  health care provider if: Your symptoms do not go away after treatment. Your symptoms come back after treatment. You are unable to pop your ears. You have: A fever. Pain in your ear. Pain in your head or neck. Fluid draining from your ear. Your hearing suddenly changes. You become very dizzy. You lose your balance. Get help right away if: You have a sudden, severe increase in any of your symptoms. Summary Eustachian tube dysfunction refers to a condition in which a blockage develops in the eustachian tube. It can be caused by ear infections, allergies, inhaled irritants, or abnormal growths in the nose or throat. Symptoms may include ear pain or fullness, hearing loss, or ringing in the ears. Mild cases are treated with techniques to unblock the ears, such as yawning or chewing gum. More severe cases are treated with medicines or procedures. This information is not intended to replace advice given to you by your health care provider. Make sure you discuss any questions you have with your health care provider. Document Revised: 02/23/2021 Document Reviewed: 02/23/2021 Elsevier Patient Education  2023 Elsevier Inc.  

## 2023-03-14 ENCOUNTER — Other Ambulatory Visit: Payer: Self-pay | Admitting: Emergency Medicine

## 2023-03-15 ENCOUNTER — Other Ambulatory Visit: Payer: Self-pay | Admitting: *Deleted

## 2023-03-15 MED ORDER — CETIRIZINE HCL 10 MG PO TABS: 10.0000 mg | ORAL_TABLET | Freq: Every day | ORAL | 0 refills | Status: DC

## 2023-04-11 ENCOUNTER — Inpatient Hospital Stay: Admission: RE | Admit: 2023-04-11 | Payer: Medicare Other | Source: Ambulatory Visit

## 2023-04-25 DIAGNOSIS — Z1211 Encounter for screening for malignant neoplasm of colon: Secondary | ICD-10-CM | POA: Diagnosis not present

## 2023-04-25 DIAGNOSIS — Z1212 Encounter for screening for malignant neoplasm of rectum: Secondary | ICD-10-CM | POA: Diagnosis not present

## 2023-05-02 LAB — COLOGUARD
COLOGUARD: NEGATIVE
Cologuard: NEGATIVE

## 2023-05-07 ENCOUNTER — Other Ambulatory Visit: Payer: Self-pay | Admitting: Family Medicine

## 2023-05-24 ENCOUNTER — Ambulatory Visit (INDEPENDENT_AMBULATORY_CARE_PROVIDER_SITE_OTHER): Payer: Medicare Other

## 2023-05-24 VITALS — Ht 61.0 in | Wt 101.0 lb

## 2023-05-24 DIAGNOSIS — Z0000 Encounter for general adult medical examination without abnormal findings: Secondary | ICD-10-CM

## 2023-05-24 DIAGNOSIS — Z1231 Encounter for screening mammogram for malignant neoplasm of breast: Secondary | ICD-10-CM

## 2023-05-24 DIAGNOSIS — Z780 Asymptomatic menopausal state: Secondary | ICD-10-CM

## 2023-05-24 DIAGNOSIS — Z122 Encounter for screening for malignant neoplasm of respiratory organs: Secondary | ICD-10-CM

## 2023-05-24 NOTE — Progress Notes (Signed)
Subjective:   Jeanne Becker is a 67 y.o. female who presents for Medicare Annual (Subsequent) preventive examination. I connected with  Zenovia Jarred on 05/24/23 by a audio enabled telemedicine application and verified that I am speaking with the correct person using two identifiers.  Patient Location: Home  Provider Location: Home Office  I discussed the limitations of evaluation and management by telemedicine. The patient expressed understanding and agreed to proceed.  Review of Systems     Cardiac Risk Factors include: advanced age (>27men, >41 women);dyslipidemia     Objective:    Today's Vitals   05/24/23 1110  Weight: 101 lb (45.8 kg)  Height: 5\' 1"  (1.549 m)   Body mass index is 19.08 kg/m.     05/24/2023   11:13 AM 05/19/2022   10:44 AM 05/18/2021   10:56 AM 04/03/2020    1:54 PM 08/20/2015    9:00 AM 01/08/2015    9:44 AM  Advanced Directives  Does Patient Have a Medical Advance Directive? No No No No No No  Would patient like information on creating a medical advance directive? No - Patient declined No - Patient declined Yes (MAU/Ambulatory/Procedural Areas - Information given) No - Patient declined      Current Medications (verified) Outpatient Encounter Medications as of 05/24/2023  Medication Sig   acetaminophen (TYLENOL) 325 MG tablet Take 650 mg by mouth every 6 (six) hours as needed.   albuterol (PROVENTIL) (2.5 MG/3ML) 0.083% nebulizer solution Take 3 mLs (2.5 mg total) by nebulization every 4 (four) hours as needed for wheezing or shortness of breath.   albuterol (VENTOLIN HFA) 108 (90 Base) MCG/ACT inhaler INHALE 2 PUFFS INTO THE LUNGS EVERY 4 HOURS AS NEEDED FOR WHEEZING OR SHORTNESS OF BREATH.   aspirin 81 MG EC tablet Take 1 tablet (81 mg total) by mouth daily. Swallow whole.   Azelastine HCl 137 MCG/SPRAY SOLN PLACE 2 SPRAYS INTO BOTH NOSTRILS 2 (TWO) TIMES DAILY. USE IN EACH NOSTRIL AS DIRECTED   CALCIUM PO Take by mouth daily.   cetirizine  (ZYRTEC) 10 MG tablet Take 1 tablet (10 mg total) by mouth daily. (NEEDS TO BE SEEN BEFORE NEXT REFILL)   fish oil-omega-3 fatty acids 1000 MG capsule Take 2 capsules (2 g total) by mouth daily.   fluticasone (FLONASE) 50 MCG/ACT nasal spray Place 2 sprays into both nostrils daily.   rosuvastatin (CRESTOR) 10 MG tablet Take 1 tablet (10 mg total) by mouth daily. (NEEDS TO BE SEEN BEFORE NEXT REFILL)   TRELEGY ELLIPTA 100-62.5-25 MCG/ACT AEPB TAKE 1 PUFF BY MOUTH EVERY DAY   VITAMIN D PO Take by mouth daily.   No facility-administered encounter medications on file as of 05/24/2023.    Allergies (verified) Codeine, Ivp dye [iodinated contrast media], Lexapro [escitalopram], and Penicillins   History: Past Medical History:  Diagnosis Date   Allergic rhinitis, cause unspecified    Anxiety state, unspecified    Chronic airway obstruction, not elsewhere classified    Depressive disorder, not elsewhere classified    Essential hypertension, benign    Fatigue    H/O diabetes mellitus    Hyperlipidemia    Lower back pain    Menopause    Osteopenia 06/25/2021   Panic disorder without agoraphobia    Seborrheic keratosis    Unspecified arthropathy, shoulder region    Past Surgical History:  Procedure Laterality Date   APPENDECTOMY     BREAST BIOPSY  12/2012   benign   CESAREAN SECTION  TONSILLECTOMY     TUBAL LIGATION     Family History  Problem Relation Age of Onset   Cancer Other    Allergies Other    Diabetes Other    Heart disease Other    Hypertension Other    Migraines Other    Osteoporosis Other    Seizures Other    COPD Mother    Heart disease Mother    Asthma Son    Stroke Father    Heart attack Brother    Aneurysm Brother    Throat cancer Maternal Grandfather    Diabetes Paternal Grandmother    Heart attack Paternal Grandmother    Pneumonia Paternal Grandfather    Social History   Socioeconomic History   Marital status: Married    Spouse name: Homero Fellers    Number of children: 2   Years of education: Not on file   Highest education level: Not on file  Occupational History   Occupation: retired/disabled  Tobacco Use   Smoking status: Every Day    Packs/day: 1.50    Years: 30.00    Additional pack years: 0.00    Total pack years: 45.00    Types: Cigarettes    Start date: 1974   Smokeless tobacco: Never   Tobacco comments:    Smoking <1 ppd, trying to cut back.  11/10/2022 hfb  Vaping Use   Vaping Use: Never used  Substance and Sexual Activity   Alcohol use: Not Currently    Alcohol/week: 12.0 standard drinks of alcohol    Types: 12 Cans of beer per week    Comment: 2 drinks/day or fewer   Drug use: Not Currently    Types: Marijuana   Sexual activity: Yes    Partners: Male    Birth control/protection: Surgical  Other Topics Concern   Not on file  Social History Narrative   Originally from [place]. Always lived in [place]. 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 She has a dog currently. Remote exposure to love birds. No mold exposure. Husband and granddaughter live with her. She has 2 sons.   Social Determinants of Health   Financial Resource Strain: Low Risk  (05/24/2023)   Overall Financial Resource Strain (CARDIA)    Difficulty of Paying Living Expenses: Not hard at all  Food Insecurity: No Food Insecurity (05/24/2023)   Hunger Vital Sign    Worried About Running Out of Food in the Last Year: Never true    Ran Out of Food in the Last Year: Never true  Transportation Needs: No Transportation Needs (05/19/2022)   PRAPARE - Administrator, Civil Service (Medical): No    Lack of Transportation (Non-Medical): No  Physical Activity: Insufficiently Active (05/24/2023)   Exercise Vital Sign    Days of Exercise per Week: 3 days    Minutes of Exercise per Session: 30 min  Stress: No Stress Concern Present (05/24/2023)   [employer] of Occupational Health - Occupational  Stress Questionnaire    Feeling of Stress : Not at all  Social Connections: Moderately Integrated (05/24/2023)   Social Connection and Isolation Panel [NHANES]    Frequency of Communication with Friends and Family: More than three times a week    Frequency of Social Gatherings with Friends and Family: More than three times a week    Attends Religious Services: 1 to 4 times per year    Active Member of Clubs or Organizations: No  Attends Banker Meetings: Never    Marital Status: Married    Tobacco Counseling Ready to quit: No Counseling given: Not Answered Tobacco comments: Smoking <1 ppd, trying to cut back.  11/10/2022 hfb   Clinical Intake:  Pre-visit preparation completed: Yes  Pain : No/denies pain     Nutritional Risks: None Diabetes: No  How often do you need to have someone help you when you read instructions, pamphlets, or other written materials from your doctor or pharmacy?: 1 - Never  Diabetic?no   Interpreter Needed?: No  Information entered by :: Renie Ora, LPN   Activities of Daily Living    05/24/2023   11:13 AM  In your present state of health, do you have any difficulty performing the following activities:  Hearing? 0  Vision? 0  Difficulty concentrating or making decisions? 0  Walking or climbing stairs? 0  Dressing or bathing? 0  Doing errands, shopping? 0  Preparing Food and eating ? N  Using the Toilet? N  In the past six months, have you accidently leaked urine? N  Do you have problems with loss of bowel control? N  Managing your Medications? N  Managing your Finances? N  Housekeeping or managing your Housekeeping? N    Patient Care Team: Daryll Drown, NP (Inactive) as PCP - General (Nurse Practitioner) Leslye Peer, MD as Consulting Physician (Pulmonary Disease) Michaelle Copas, MD as Referring Physician (Optometry) Newman Pies, MD as Consulting Physician (Otolaryngology)  Indicate any recent Medical Services  you may have received from other than [facility] providers in the past year (date may be approximate).     Assessment:   This is a routine wellness examination for Latyra.  Hearing/Vision screen Vision Screening - Comments:: Wears rx glasses - up to date with routine eye exams with  Dr.Lee   Dietary issues and exercise activities discussed: Current Exercise Habits: The patient does not participate in regular exercise at present, Type of exercise: walking, Time (Minutes): 30, Frequency (Times/Week): 3, Weekly Exercise (Minutes/Week): 90, Intensity: Mild, Exercise limited by: None identified   Goals Addressed               This Visit's Progress     Have 3 meals a day (pt-stated)   On track     Pt does not have much of an appetite, she will try to eat alittle something at breakfast, lunch and supper.       Depression Screen    05/24/2023   11:12 AM 03/11/2023    2:03 PM 12/31/2022   11:26 AM 10/15/2022    3:09 PM 07/12/2022    2:54 PM 05/19/2022    2:17 PM 05/19/2022   10:41 AM  PHQ 2/9 Scores  PHQ - 2 Score 0 0 0 0 0 0 0  PHQ- 9 Score  1 1 2  0 0 2    Fall Risk    05/24/2023   11:11 AM 03/11/2023    2:03 PM 12/31/2022   11:26 AM 10/15/2022    3:09 PM 09/03/2022    2:30 PM  Fall Risk   Falls in the past year? 0 0 0 0 0  Number falls in past yr: 0  0    Injury with Fall? 0  0    Risk for fall due to : No Fall Risks  No Fall Risks    Follow up Falls prevention discussed  Education provided      FALL RISK PREVENTION PERTAINING TO  THE HOME:  Any stairs in or around the home? Yes  If so, are there any without handrails? No  Home free of loose throw rugs in walkways, pet beds, electrical cords, etc? Yes  Adequate lighting in your home to reduce risk of falls? Yes   ASSISTIVE DEVICES UTILIZED TO PREVENT FALLS:  Life alert? No  Use of a cane, walker or w/c? No  Grab bars in the bathroom? No  Shower chair or bench in shower? No  Elevated toilet seat or a handicapped toilet? No         05/24/2023   11:14 AM 05/19/2022   10:45 AM 04/03/2020    1:55 PM  6CIT Screen  What Year? 0 points 0 points 0 points  What month? 0 points 0 points 0 points  What time? 0 points 0 points 0 points  Count back from 20 0 points 0 points 0 points  Months in reverse 0 points 4 points 2 points  Repeat phrase 0 points 2 points 2 points  Total Score 0 points 6 points 4 points    Immunizations Immunization History  Administered Date(s) Administered   DTaP 01/28/2014   Fluad Quad(high Dose 65+) 09/28/2021, 11/10/2022   Influenza Split 09/09/2021   Influenza Whole 10/07/2012   Influenza, High Dose Seasonal PF 11/11/2016, 09/15/2017   Influenza,inj,Quad PF,6+ Mos 11/06/2012, 08/31/2013, 01/08/2015, 02/19/2016, 09/14/2018, 09/21/2019   Influenza-Unspecified 11/06/2012, 02/19/2016, 11/11/2016, 09/15/2017   MMR 10/20/2001   Moderna Sars-Covid-2 Vaccination 09/22/2020, 10/20/2020   Pneumococcal Conjugate-13 07/17/2014   Pneumococcal Polysaccharide-23 08/31/2013   Tdap 01/29/2014    TDAP status: Up to date  Flu Vaccine status: Up to date  Pneumococcal vaccine status: Up to date  Covid-19 vaccine status: Completed vaccines  Qualifies for Shingles Vaccine? Yes   Zostavax completed No   Shingrix Completed?: No.    Education has been provided regarding the importance of this vaccine. Patient has been advised to call insurance company to determine out of pocket expense if they have not yet received this vaccine. Advised may also receive vaccine at local pharmacy or Health Dept. Verbalized acceptance and understanding.  Screening Tests Health Maintenance  Topic Date Due   Zoster Vaccines- Shingrix (1 of 2) Never done   MAMMOGRAM  04/29/2021   Lung Cancer Screening  11/25/2021   COVID-19 Vaccine (3 - 2023-24 season) 06/09/2023 (Originally 08/27/2022)   Pneumonia Vaccine 29+ Years old (3 of 3 - PPSV23 or PCV20) 01/01/2024 (Originally 01/12/2021)   Hepatitis C Screening  01/01/2024  (Originally 01/12/1974)   DEXA SCAN  06/27/2023   INFLUENZA VACCINE  07/28/2023   DTaP/Tdap/Td (3 - Td or Tdap) 01/30/2024   Medicare Annual Wellness (AWV)  05/23/2024   Fecal DNA (Cologuard)  04/10/2026   HPV VACCINES  Aged Out   Colonoscopy  Discontinued    Health Maintenance  Health Maintenance Due  Topic Date Due   Zoster Vaccines- Shingrix (1 of 2) Never done   MAMMOGRAM  04/29/2021   Lung Cancer Screening  11/25/2021    Colorectal cancer screening: Type of screening: Cologuard. Completed 04/11/2023. Repeat every 3 years  Mammogram status: Ordered 05/24/2023. Pt provided with contact info and advised to call to schedule appt.   Bone Density status: Ordered 05/24/2023. Pt provided with contact info and advised to call to schedule appt.  Lung Cancer Screening: (Low Dose CT Chest recommended if Age 6-80 years, 30 pack-year currently smoking OR have quit w/in 15years.) does qualify.   Lung Cancer Screening Referral: referral 05/24/2023  Additional Screening:  Hepatitis C Screening: does not qualify;   Vision Screening: Recommended annual ophthalmology exams for early detection of glaucoma and other disorders of the eye. Is the patient up to date with their annual eye exam?  Yes  Who is the provider or what is the name of the office in which the patient attends annual eye exams? Dr.Lee  If pt is not established with a provider, would they like to be referred to a provider to establish care? No .   Dental Screening: Recommended annual dental exams for proper oral hygiene  Community Resource Referral / Chronic Care Management: CRR required this visit?  No   CCM required this visit?  No      Plan:     I have personally reviewed and noted the following in the patient's chart:   Medical and social history Use of alcohol, tobacco or illicit drugs  Current medications and supplements including opioid prescriptions. Patient is not currently taking opioid  prescriptions. Functional ability and status Nutritional status Physical activity Advanced directives List of other physicians Hospitalizations, surgeries, and ER visits in previous 12 months Vitals Screenings to include cognitive, depression, and falls Referrals and appointments  In addition, I have reviewed and discussed with patient certain preventive protocols, quality metrics, and best practice recommendations. A written personalized care plan for preventive services as well as general preventive health recommendations were provided to patient.     Lorrene Reid, LPN   1/61/0960   Nurse Notes: none

## 2023-05-24 NOTE — Patient Instructions (Signed)
Jeanne Becker , Thank you for taking time to come for your Medicare Wellness Visit. I appreciate your ongoing commitment to your health goals. Please review the following plan we discussed and let me know if I can assist you in the future.   These are the goals we discussed:  Goals       Have 3 meals a day (pt-stated)      Pt does not have much of an appetite, she will try to eat alittle something at breakfast, lunch and supper.        This is a list of the screening recommended for you and due dates:  Health Maintenance  Topic Date Due   Zoster (Shingles) Vaccine (1 of 2) Never done   Mammogram  04/29/2021   Screening for Lung Cancer  11/25/2021   COVID-19 Vaccine (3 - 2023-24 season) 06/09/2023*   Pneumonia Vaccine (3 of 3 - PPSV23 or PCV20) 01/01/2024*   Hepatitis C Screening  01/01/2024*   DEXA scan (bone density measurement)  06/27/2023   Flu Shot  07/28/2023   DTaP/Tdap/Td vaccine (3 - Td or Tdap) 01/30/2024   Medicare Annual Wellness Visit  05/23/2024   Cologuard (Stool DNA test)  04/10/2026   HPV Vaccine  Aged Out   Colon Cancer Screening  Discontinued  *Topic was postponed. The date shown is not the original due date.    Advanced directives: Advance directive discussed with you today. I have provided a copy for you to complete at home and have notarized. Once this is complete please bring a copy in to our office so we can scan it into your chart.   Conditions/risks identified: Aim for 30 minutes of exercise or brisk walking, 6-8 glasses of water, and 5 servings of fruits and vegetables each day.   Next appointment: Follow up in one year for your annual wellness visit    Preventive Care 67 Years and Older, Female Preventive care refers to lifestyle choices and visits with your health care provider that can promote health and wellness. What does preventive care include? A yearly physical exam. This is also called an annual well check. Dental exams once or twice a  year. Routine eye exams. Ask your health care provider how often you should have your eyes checked. Personal lifestyle choices, including: Daily care of your teeth and gums. Regular physical activity. Eating a healthy diet. Avoiding tobacco and drug use. Limiting alcohol use. Practicing safe sex. Taking low-dose aspirin every day. Taking vitamin and mineral supplements as recommended by your health care provider. What happens during an annual well check? The services and screenings done by your health care provider during your annual well check will depend on your age, overall health, lifestyle risk factors, and family history of disease. Counseling  Your health care provider may ask you questions about your: Alcohol use. Tobacco use. Drug use. Emotional well-being. Home and relationship well-being. Sexual activity. Eating habits. History of falls. Memory and ability to understand (cognition). Work and work Astronomer. Reproductive health. Screening  You may have the following tests or measurements: Height, weight, and BMI. Blood pressure. Lipid and cholesterol levels. These may be checked every 5 years, or more frequently if you are over 50 years old. Skin check. Lung cancer screening. You may have this screening every year starting at age 67 if you have a 30-pack-year history of smoking and currently smoke or have quit within the past 15 years. Fecal occult blood test (FOBT) of the stool. You may have  this test every year starting at age 67. Flexible sigmoidoscopy or colonoscopy. You may have a sigmoidoscopy every 5 years or a colonoscopy every 10 years starting at age 35. Hepatitis C blood test. Hepatitis B blood test. Sexually transmitted disease (STD) testing. Diabetes screening. This is done by checking your blood sugar (glucose) after you have not eaten for a while (fasting). You may have this done every 1-3 years. Bone density scan. This is done to screen for  osteoporosis. You may have this done starting at age 67. Mammogram. This may be done every 1-2 years. Talk to your health care provider about how often you should have regular mammograms. Talk with your health care provider about your test results, treatment options, and if necessary, the need for more tests. Vaccines  Your health care provider may recommend certain vaccines, such as: Influenza vaccine. This is recommended every year. Tetanus, diphtheria, and acellular pertussis (Tdap, Td) vaccine. You may need a Td booster every 10 years. Zoster vaccine. You may need this after age 67. Pneumococcal 13-valent conjugate (PCV13) vaccine. One dose is recommended after age 67. Pneumococcal polysaccharide (PPSV23) vaccine. One dose is recommended after age 67. Talk to your health care provider about which screenings and vaccines you need and how often you need them. This information is not intended to replace advice given to you by your health care provider. Make sure you discuss any questions you have with your health care provider. Document Released: 01/09/2016 Document Revised: 09/01/2016 Document Reviewed: 10/14/2015 Elsevier Interactive Patient Education  2017 [employer].  Fall Prevention in the Home Falls can cause injuries. They can happen to people of all ages. There are many things you can do to make your home safe and to help prevent falls. What can I do on the outside of my home? Regularly fix the edges of walkways and driveways and fix any cracks. Remove anything that might make you trip as you walk through a door, such as a raised step or threshold. Trim any bushes or trees on the path to your home. Use bright outdoor lighting. Clear any walking paths of anything that might make someone trip, such as rocks or tools. Regularly check to see if handrails are loose or broken. Make sure that both sides of any steps have handrails. Any raised decks and porches should have guardrails on  the edges. Have any leaves, snow, or ice cleared regularly. Use sand or salt on walking paths during winter. Clean up any spills in your garage right away. This includes oil or grease spills. What can I do in the bathroom? Use night lights. Install grab bars by the toilet and in the tub and shower. Do not use towel bars as grab bars. Use non-skid mats or decals in the tub or shower. If you need to sit down in the shower, use a plastic, non-slip stool. Keep the floor dry. Clean up any water that spills on the floor as soon as it happens. Remove soap buildup in the tub or shower regularly. Attach bath mats securely with double-sided non-slip rug tape. Do not have throw rugs and other things on the floor that can make you trip. What can I do in the bedroom? Use night lights. Make sure that you have a light by your bed that is easy to reach. Do not use any sheets or blankets that are too big for your bed. They should not hang down onto the floor. Have a firm chair that has side arms. You  can use this for support while you get dressed. Do not have throw rugs and other things on the floor that can make you trip. What can I do in the kitchen? Clean up any spills right away. Avoid walking on wet floors. Keep items that you use a lot in easy-to-reach places. If you need to reach something above you, use a strong step stool that has a grab bar. Keep electrical cords out of the way. Do not use floor polish or wax that makes floors slippery. If you must use wax, use non-skid floor wax. Do not have throw rugs and other things on the floor that can make you trip. What can I do with my stairs? Do not leave any items on the stairs. Make sure that there are handrails on both sides of the stairs and use them. Fix handrails that are broken or loose. Make sure that handrails are as long as the stairways. Check any carpeting to make sure that it is firmly attached to the stairs. Fix any carpet that is loose  or worn. Avoid having throw rugs at the top or bottom of the stairs. If you do have throw rugs, attach them to the floor with carpet tape. Make sure that you have a light switch at the top of the stairs and the bottom of the stairs. If you do not have them, ask someone to add them for you. What else can I do to help prevent falls? Wear shoes that: Do not have high heels. Have rubber bottoms. Are comfortable and fit you well. Are closed at the toe. Do not wear sandals. If you use a stepladder: Make sure that it is fully opened. Do not climb a closed stepladder. Make sure that both sides of the stepladder are locked into place. Ask someone to hold it for you, if possible. Clearly mark and make sure that you can see: Any grab bars or handrails. First and last steps. Where the edge of each step is. Use tools that help you move around (mobility aids) if they are needed. These include: Canes. Walkers. Scooters. Crutches. Turn on the lights when you go into a dark area. Replace any light bulbs as soon as they burn out. Set up your furniture so you have a clear path. Avoid moving your furniture around. If any of your floors are uneven, fix them. If there are any pets around you, be aware of where they are. Review your medicines with your doctor. Some medicines can make you feel dizzy. This can increase your chance of falling. Ask your doctor what other things that you can do to help prevent falls. This information is not intended to replace advice given to you by your health care provider. Make sure you discuss any questions you have with your health care provider. Document Released: 10/09/2009 Document Revised: 05/20/2016 Document Reviewed: 01/17/2015 Elsevier Interactive Patient Education  2017 [employer].

## 2023-06-03 DIAGNOSIS — R059 Cough, unspecified: Secondary | ICD-10-CM | POA: Diagnosis not present

## 2023-06-03 DIAGNOSIS — R062 Wheezing: Secondary | ICD-10-CM | POA: Diagnosis not present

## 2023-06-03 DIAGNOSIS — Z8709 Personal history of other diseases of the respiratory system: Secondary | ICD-10-CM | POA: Diagnosis not present

## 2023-06-03 DIAGNOSIS — F17200 Nicotine dependence, unspecified, uncomplicated: Secondary | ICD-10-CM | POA: Diagnosis not present

## 2023-06-03 DIAGNOSIS — H9202 Otalgia, left ear: Secondary | ICD-10-CM | POA: Diagnosis not present

## 2023-06-03 DIAGNOSIS — J029 Acute pharyngitis, unspecified: Secondary | ICD-10-CM | POA: Diagnosis not present

## 2023-06-03 DIAGNOSIS — R0602 Shortness of breath: Secondary | ICD-10-CM | POA: Diagnosis not present

## 2023-06-03 DIAGNOSIS — J449 Chronic obstructive pulmonary disease, unspecified: Secondary | ICD-10-CM | POA: Diagnosis not present

## 2023-06-29 ENCOUNTER — Encounter: Payer: Self-pay | Admitting: Nurse Practitioner

## 2023-07-01 ENCOUNTER — Other Ambulatory Visit: Payer: Self-pay | Admitting: Family Medicine

## 2023-07-01 DIAGNOSIS — Z1231 Encounter for screening mammogram for malignant neoplasm of breast: Secondary | ICD-10-CM

## 2023-07-04 ENCOUNTER — Other Ambulatory Visit: Payer: Self-pay | Admitting: Family Medicine

## 2023-07-04 ENCOUNTER — Ambulatory Visit
Admission: RE | Admit: 2023-07-04 | Discharge: 2023-07-04 | Disposition: A | Discharge status: Home / Self Care | Payer: Medicare Other | Source: Ambulatory Visit | Attending: Family | Admitting: Family

## 2023-07-04 ENCOUNTER — Ambulatory Visit (INDEPENDENT_AMBULATORY_CARE_PROVIDER_SITE_OTHER): Payer: Medicare Other

## 2023-07-04 DIAGNOSIS — Z780 Asymptomatic menopausal state: Secondary | ICD-10-CM

## 2023-07-04 DIAGNOSIS — Z1231 Encounter for screening mammogram for malignant neoplasm of breast: Secondary | ICD-10-CM

## 2023-07-07 ENCOUNTER — Other Ambulatory Visit: Payer: Self-pay | Admitting: Family Medicine

## 2023-07-07 ENCOUNTER — Other Ambulatory Visit: Payer: Self-pay

## 2023-07-07 ENCOUNTER — Ambulatory Visit: Payer: Medicare Other | Admitting: Nurse Practitioner

## 2023-07-07 DIAGNOSIS — M85851 Other specified disorders of bone density and structure, right thigh: Secondary | ICD-10-CM | POA: Diagnosis not present

## 2023-07-07 DIAGNOSIS — R928 Other abnormal and inconclusive findings on diagnostic imaging of breast: Secondary | ICD-10-CM

## 2023-07-07 DIAGNOSIS — Z780 Asymptomatic menopausal state: Secondary | ICD-10-CM | POA: Diagnosis not present

## 2023-07-07 DIAGNOSIS — Z87891 Personal history of nicotine dependence: Secondary | ICD-10-CM

## 2023-07-07 DIAGNOSIS — F17210 Nicotine dependence, cigarettes, uncomplicated: Secondary | ICD-10-CM

## 2023-07-07 DIAGNOSIS — M85852 Other specified disorders of bone density and structure, left thigh: Secondary | ICD-10-CM | POA: Diagnosis not present

## 2023-07-07 NOTE — Progress Notes (Deleted)
New Patient Office Visit  Subjective    Patient ID: Jeanne Becker, female    DOB: 10/28/1956  Age: 67 y.o. MRN: [id number]  CC: No chief complaint on file.   HPI Jeanne Becker presents to establish care ***  Outpatient Encounter Medications as of 07/07/2023  Medication Sig   acetaminophen (TYLENOL) 325 MG tablet Take 650 mg by mouth every 6 (six) hours as needed.   albuterol (PROVENTIL) (2.5 MG/3ML) 0.083% nebulizer solution Take 3 mLs (2.5 mg total) by nebulization every 4 (four) hours as needed for wheezing or shortness of breath.   albuterol (VENTOLIN HFA) 108 (90 Base) MCG/ACT inhaler INHALE 2 PUFFS INTO THE LUNGS EVERY 4 HOURS AS NEEDED FOR WHEEZING OR SHORTNESS OF BREATH.   aspirin 81 MG EC tablet Take 1 tablet (81 mg total) by mouth daily. Swallow whole.   Azelastine HCl 137 MCG/SPRAY SOLN PLACE 2 SPRAYS INTO BOTH NOSTRILS 2 (TWO) TIMES DAILY. USE IN EACH NOSTRIL AS DIRECTED   CALCIUM PO Take by mouth daily.   cetirizine (ZYRTEC) 10 MG tablet Take 1 tablet (10 mg total) by mouth daily. (NEEDS TO BE SEEN BEFORE NEXT REFILL)   fish oil-omega-3 fatty acids 1000 MG capsule Take 2 capsules (2 g total) by mouth daily.   fluticasone (FLONASE) 50 MCG/ACT nasal spray Place 2 sprays into both nostrils daily.   rosuvastatin (CRESTOR) 10 MG tablet Take 1 tablet (10 mg total) by mouth daily. (NEEDS TO BE SEEN BEFORE NEXT REFILL)   TRELEGY ELLIPTA 100-62.5-25 MCG/ACT AEPB TAKE 1 PUFF BY MOUTH EVERY DAY   VITAMIN D PO Take by mouth daily.   No facility-administered encounter medications on file as of 07/07/2023.    Past Medical History:  Diagnosis Date   Allergic rhinitis, cause unspecified    Anxiety state, unspecified    Chronic airway obstruction, not elsewhere classified    Depressive disorder, not elsewhere classified    Essential hypertension, benign    Fatigue    H/O diabetes mellitus    Hyperlipidemia    Lower back pain    Menopause    Osteopenia 06/25/2021   Panic  disorder without agoraphobia    Seborrheic keratosis    Unspecified arthropathy, shoulder region     Past Surgical History:  Procedure Laterality Date   APPENDECTOMY     BREAST BIOPSY  12/2012   benign   CESAREAN SECTION     TONSILLECTOMY     TUBAL LIGATION      Family History  Problem Relation Age of Onset   COPD Mother    Heart disease Mother    Stroke Father    Throat cancer Maternal Grandfather    Diabetes Paternal Grandmother    Heart attack Paternal Grandmother    Pneumonia Paternal Grandfather    Heart attack Brother    Aneurysm Brother    Asthma Son    Cancer Other    Allergies Other    Diabetes Other    Heart disease Other    Hypertension Other    Migraines Other    Osteoporosis Other    Seizures Other    Breast cancer Neg Hx     Social History   Socioeconomic History   Marital status: Married    Spouse name: Homero Fellers   Number of children: 2   Years of education: Not on file   Highest education level: Not on file  Occupational History   Occupation: retired/disabled  Tobacco Use   Smoking status: Every Day  Current packs/day: 1.50    Average packs/day: 1.5 packs/day for 50.5 years (75.8 ttl pk-yrs)    Types: Cigarettes    Start date: 1974   Smokeless tobacco: Never   Tobacco comments:    Smoking <1 ppd, trying to cut back.  11/10/2022 hfb  Vaping Use   Vaping status: Never Used  Substance and Sexual Activity   Alcohol use: Not Currently    Alcohol/week: 12.0 standard drinks of alcohol    Types: 12 Cans of beer per week    Comment: 2 drinks/day or fewer   Drug use: Not Currently    Types: Marijuana   Sexual activity: Yes    Partners: Male    Birth control/protection: Surgical  Other Topics Concern   Not on file  Social History Narrative   Originally from [place]. Always lived in [place]. 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 She has a dog currently. Remote exposure to love birds. No mold  exposure. Husband and granddaughter live with her. She has 2 sons.   Social Determinants of Health   Financial Resource Strain: Low Risk  (05/24/2023)   Overall Financial Resource Strain (CARDIA)    Difficulty of Paying Living Expenses: Not hard at all  Food Insecurity: No Food Insecurity (05/24/2023)   Hunger Vital Sign    Worried About Running Out of Food in the Last Year: Never true    Ran Out of Food in the Last Year: Never true  Transportation Needs: No Transportation Needs (05/19/2022)   PRAPARE - Administrator, Civil Service (Medical): No    Lack of Transportation (Non-Medical): No  Physical Activity: Insufficiently Active (05/24/2023)   Exercise Vital Sign    Days of Exercise per Week: 3 days    Minutes of Exercise per Session: 30 min  Stress: No Stress Concern Present (05/24/2023)   [employer] of Occupational Health - Occupational Stress Questionnaire    Feeling of Stress : Not at all  Social Connections: Moderately Integrated (05/24/2023)   Social Connection and Isolation Panel [NHANES]    Frequency of Communication with Friends and Family: More than three times a week    Frequency of Social Gatherings with Friends and Family: More than three times a week    Attends Religious Services: 1 to 4 times per year    Active Member of [employer] or Organizations: No    Attends Banker Meetings: Never    Marital Status: Married  Catering manager Violence: Not At Risk (05/24/2023)   Humiliation, Afraid, Rape, and Kick questionnaire    Fear of Current or Ex-Partner: No    Emotionally Abused: No    Physically Abused: No    Sexually Abused: No    ROS Negative unless indicated in HPI   Objective    There were no vitals taken for this visit.  Physical Exam  Last CBC Lab Results  Component Value Date   WBC 4.7 12/31/2022   HGB 15.2 12/31/2022   HCT 44.6 12/31/2022   MCV 103 (H) 12/31/2022   MCH 35.1 (H) 12/31/2022   RDW 11.3 (L) 12/31/2022   PLT  176 12/31/2022   Last metabolic panel Lab Results  Component Value Date   GLUCOSE 112 (H) 12/31/2022   NA 140 12/31/2022   K 4.3 12/31/2022   CL 102 12/31/2022   CO2 24 12/31/2022   BUN 9 12/31/2022   CREATININE 0.70 12/31/2022   EGFR 95 12/31/2022   CALCIUM  9.4 12/31/2022   PROT 6.5 12/31/2022   ALBUMIN 4.3 12/31/2022   LABGLOB 2.2 12/31/2022   AGRATIO 2.0 12/31/2022   BILITOT 0.4 12/31/2022   ALKPHOS 55 12/31/2022   AST 29 12/31/2022   ALT 24 12/31/2022   Last lipids Lab Results  Component Value Date   CHOL 156 12/31/2022   HDL 65 12/31/2022   LDLCALC 64 12/31/2022   TRIG 163 (H) 12/31/2022   CHOLHDL 2.4 12/31/2022        Assessment & Plan:  Encounter to establish care    No follow-ups on file.   Arrie Aran Santa Lighter, DNP [facility] [address] [place], [place] [address] [phone]

## 2023-07-16 ENCOUNTER — Other Ambulatory Visit: Payer: Self-pay | Admitting: Emergency Medicine

## 2023-07-21 ENCOUNTER — Telehealth: Payer: Self-pay | Admitting: Nurse Practitioner

## 2023-07-25 ENCOUNTER — Encounter: Payer: Self-pay | Admitting: Nurse Practitioner

## 2023-07-25 ENCOUNTER — Ambulatory Visit (INDEPENDENT_AMBULATORY_CARE_PROVIDER_SITE_OTHER): Payer: Medicare Other | Admitting: Nurse Practitioner

## 2023-07-25 VITALS — BP 127/77 | HR 108 | Temp 97.5°F | Ht 61.0 in | Wt 100.4 lb

## 2023-07-25 DIAGNOSIS — J309 Allergic rhinitis, unspecified: Secondary | ICD-10-CM | POA: Diagnosis not present

## 2023-07-25 DIAGNOSIS — I700 Atherosclerosis of aorta: Secondary | ICD-10-CM

## 2023-07-25 DIAGNOSIS — R7989 Other specified abnormal findings of blood chemistry: Secondary | ICD-10-CM | POA: Insufficient documentation

## 2023-07-25 DIAGNOSIS — E782 Mixed hyperlipidemia: Secondary | ICD-10-CM

## 2023-07-25 DIAGNOSIS — Z0000 Encounter for general adult medical examination without abnormal findings: Secondary | ICD-10-CM

## 2023-07-25 DIAGNOSIS — R928 Other abnormal and inconclusive findings on diagnostic imaging of breast: Secondary | ICD-10-CM

## 2023-07-25 DIAGNOSIS — J449 Chronic obstructive pulmonary disease, unspecified: Secondary | ICD-10-CM

## 2023-07-25 DIAGNOSIS — R7309 Other abnormal glucose: Secondary | ICD-10-CM | POA: Diagnosis not present

## 2023-07-25 DIAGNOSIS — R6889 Other general symptoms and signs: Secondary | ICD-10-CM | POA: Diagnosis not present

## 2023-07-25 LAB — BAYER DCA HB A1C WAIVED: HB A1C (BAYER DCA - WAIVED): 5.5 % (ref 4.8–5.6)

## 2023-07-25 MED ORDER — FEXOFENADINE HCL 180 MG PO TABS
180.0000 mg | ORAL_TABLET | Freq: Every day | ORAL | 1 refills | Status: AC
Start: 2023-07-25 — End: ?

## 2023-07-25 MED ORDER — ROSUVASTATIN CALCIUM 10 MG PO TABS: 10.0000 mg | ORAL_TABLET | Freq: Every day | ORAL | 1 refills | Status: AC

## 2023-07-25 NOTE — Progress Notes (Signed)
Established Patient Office Visit  Subjective   Patient ID: Jeanne Becker, female    DOB: 11/17/1956  Age: 67 y.o. MRN: [id number]  Chief Complaint  Patient presents with   Establish Care    HPI Jeanne Becker isa 67yrs old female seen as a follow up for chronic disease Hyperlipidemia and COPD. This is this provider first encounter with this client. Lat seen at this practice 03/152024.  Still smokes about 1-pack daily, currently under the care of [facility] and reports occasional SOB that is well managed with Ventolin. No recent exacerbation. Has a yearly lungs screening appointment next week.  Abnormal Mammogram  She has a mammogram done  07/04/2023 that yield  a possible mass on the right breast that warrant further evaluation. "V". She reports that she already have a diagnostic [facility]. She reports that she had the same issue on the left breast a few yrs ago and had a biopsy that was normal.  Reports that she is transferring her care to [facility] because she has been having issues scheduling appointments d/t lack appointment slots " I have been sick every time I called, I was told that the clinic booked for today".   Hyperlipidemia: Currently on Crestor and OTC fish oil. Denies nay side effect from the med change in urine color or pain. She reports weight and hoping that she not longer need the medication.  Vit D: Currently taking OTC Vit D.  " I used to be on vit D, they stopped prescribing, so I have been taking TC not sure if it is the right dose.we will check D-level today. Denies recent falls  Allergic Rhinitis Reports that Zyrtec no longer relieve her season allergy symptom. Will switch her to allegra.  All health maintenance are up to date  Flowsheet Row Office Visit from 07/25/2023 in [facility]  PHQ-9 Total Score 0        Patient Active Problem List   Diagnosis Date Noted    Routine medical exam 07/25/2023   Low vitamin D level 07/25/2023   Acute bronchitis with COPD (HCC) 02/17/2022   URI (upper respiratory infection) 02/17/2022   Atherosclerosis of native coronary artery of native heart without angina pectoris 11/18/2021   Osteopenia 06/25/2021   Aortic atherosclerosis (HCC) 01/23/2021   Arthritis 01/23/2021   Hyperlipidemia 09/06/2016   Chronic allergic rhinitis 09/06/2016   Scoliosis 02/14/2016   Spondylosis of thoracolumbar region without myelopathy or radiculopathy 02/14/2016   Current smoker 08/12/2009   COPD, moderately severe 08/11/2009   Past Medical History:  Diagnosis Date   Allergic rhinitis, cause unspecified    Anxiety state, unspecified    Chronic airway obstruction, not elsewhere classified    Depressive disorder, not elsewhere classified    Essential hypertension, benign    Fatigue    H/O diabetes mellitus    Hyperlipidemia    Lower back pain    Menopause    Osteopenia 06/25/2021   Panic disorder without agoraphobia    Seborrheic keratosis    Unspecified arthropathy, shoulder region    Past Surgical History:  Procedure Laterality Date   APPENDECTOMY     BREAST BIOPSY  12/2012   benign   CESAREAN SECTION     TONSILLECTOMY     TUBAL LIGATION     Social History   Tobacco Use   Smoking status: Every Day    Current packs/day: 1.50  Average packs/day: 1.5 packs/day for 50.6 years (75.9 ttl pk-yrs)    Types: Cigarettes    Start date: 1974   Smokeless tobacco: Never   Tobacco comments:    Smoking <1 ppd, trying to cut back.  11/10/2022 hfb  Vaping Use   Vaping status: Never Used  Substance Use Topics   Alcohol use: Not Currently    Alcohol/week: 12.0 standard drinks of alcohol    Types: 12 Cans of beer per week    Comment: 2 drinks/day or fewer   Drug use: Not Currently    Types: Marijuana   Social History   Socioeconomic History   Marital status: Married    Spouse name: Homero Fellers   Number of children: 2    Years of education: Not on file   Highest education level: Not on file  Occupational History   Occupation: retired/disabled  Tobacco Use   Smoking status: Every Day    Current packs/day: 1.50    Average packs/day: 1.5 packs/day for 50.6 years (75.9 ttl pk-yrs)    Types: Cigarettes    Start date: 1974   Smokeless tobacco: Never   Tobacco comments:    Smoking <1 ppd, trying to cut back.  11/10/2022 hfb  Vaping Use   Vaping status: Never Used  Substance and Sexual Activity   Alcohol use: Not Currently    Alcohol/week: 12.0 standard drinks of alcohol    Types: 12 Cans of beer per week    Comment: 2 drinks/day or fewer   Drug use: Not Currently    Types: Marijuana   Sexual activity: Yes    Partners: Male    Birth control/protection: Surgical  Other Topics Concern   Not on file  Social History Narrative   Originally from [place]. Always lived in [place]. 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 She has a dog currently. Remote exposure to love birds. No mold exposure. Husband and granddaughter live with her. She has 2 sons.   Social Determinants of Health   Financial Resource Strain: Low Risk  (05/24/2023)   Overall Financial Resource Strain (CARDIA)    Difficulty of Paying Living Expenses: Not hard at all  Food Insecurity: No Food Insecurity (05/24/2023)   Hunger Vital Sign    Worried About Running Out of Food in the Last Year: Never true    Ran Out of Food in the Last Year: Never true  Transportation Needs: No Transportation Needs (05/19/2022)   PRAPARE - Administrator, Civil Service (Medical): No    Lack of Transportation (Non-Medical): No  Physical Activity: Insufficiently Active (05/24/2023)   Exercise Vital Sign    Days of Exercise per Week: 3 days    Minutes of Exercise per Session: 30 min  Stress: No Stress Concern Present (05/24/2023)   [employer] of Occupational Health - Occupational Stress Questionnaire     Feeling of Stress : Not at all  Social Connections: Moderately Integrated (05/24/2023)   Social Connection and Isolation Panel [NHANES]    Frequency of Communication with Friends and Family: More than three times a week    Frequency of Social Gatherings with Friends and Family: More than three times a week    Attends Religious Services: 1 to 4 times per year    Active Member of [employer] or Organizations: No    Attends Banker Meetings: Never    Marital Status: Married  Catering manager Violence: Not At Risk (05/24/2023)   Humiliation,  Afraid, Rape, and Kick questionnaire    Fear of Current or Ex-Partner: No    Emotionally Abused: No    Physically Abused: No    Sexually Abused: No   Family Status  Relation Name Status   Mother  Deceased   Father  Deceased   MGM  Deceased   MGF  Deceased   PGM  Deceased   PGF  Deceased   Brother  Deceased   Son 2 Alive   Other  (Not Specified)   Other  (Not Specified)   Other  (Not Specified)   Other  (Not Specified)   Other  (Not Specified)   Other  (Not Specified)   Other  (Not Specified)   Other  (Not Specified)   Neg Hx  (Not Specified)  No partnership data on file   Family History  Problem Relation Age of Onset   COPD Mother    Heart disease Mother    Stroke Father    Throat cancer Maternal Grandfather    Diabetes Paternal Grandmother    Heart attack Paternal Grandmother    Pneumonia Paternal Grandfather    Heart attack Brother    Aneurysm Brother    Asthma Son    Cancer Other    Allergies Other    Diabetes Other    Heart disease Other    Hypertension Other    Migraines Other    Osteoporosis Other    Seizures Other    Breast cancer Neg Hx    Allergies  Allergen Reactions   Codeine Rash   Ivp Dye [Iodinated Contrast Media] Other (See Comments)   Lexapro [Escitalopram] Nausea And Vomiting   Penicillins Other (See Comments)    Intolerance       Review of Systems  Constitutional:  Positive for weight loss.  Negative for chills and fever.       Intentional weight loss  HENT:  Positive for congestion. Negative for ear pain and sinus pain.   Eyes:  Negative for blurred vision and double vision.  Respiratory:  Positive for shortness of breath. Negative for cough.        D/t COP, still smoking  Cardiovascular: Negative.  Negative for chest pain, palpitations and leg swelling.  Gastrointestinal:  Negative for abdominal pain and melena.  Genitourinary:  Negative for dysuria and urgency.  Musculoskeletal:  Negative for falls and myalgias.  Skin: Negative.  Negative for itching and rash.  Neurological:  Negative for dizziness, weakness and headaches.  Endo/Heme/Allergies:  Negative for polydipsia. Does not bruise/bleed easily.  Psychiatric/Behavioral: Negative.  Negative for hallucinations and suicidal ideas.    Negative unless indicated in HPI   Objective:     BP 127/77   Pulse (!) 108   Temp (!) 97.5 F (36.4 C) (Temporal)   Ht 5\' 1"  (1.549 m)   Wt 100 lb 6.4 oz (45.5 kg)   SpO2 97%   BMI 18.97 kg/m  BP Readings from Last 3 Encounters:  07/25/23 127/77  03/11/23 132/72  12/31/22 135/81   Wt Readings from Last 3 Encounters:  07/25/23 100 lb 6.4 oz (45.5 kg)  05/24/23 101 lb (45.8 kg)  03/11/23 100 lb (45.4 kg)      Physical Exam Vitals and nursing note reviewed.  Constitutional:      Appearance: Normal appearance. She is normal weight.  HENT:     Head: Normocephalic and atraumatic.     Nose: Congestion present.  Eyes:     General: No scleral icterus.  Extraocular Movements: Extraocular movements intact.     Conjunctiva/sclera: Conjunctivae normal.     Pupils: Pupils are equal, round, and reactive to light.  Neck:     Vascular: No carotid bruit.  Cardiovascular:     Rate and Rhythm: Normal rate and regular rhythm.  Pulmonary:     Breath sounds: Examination of the right-lower field reveals wheezing. Examination of the left-lower field reveals wheezing. Wheezing present.      Comments: expiratory Musculoskeletal:        General: Normal range of motion.     Cervical back: Normal range of motion and neck supple. No rigidity or tenderness.     Right lower leg: Edema present.     Left lower leg: No edema.  Skin:    General: Skin is warm and dry.     Findings: No rash.  Neurological:     General: No focal deficit present.     Mental Status: She is alert and oriented to person, place, and time.      No results found for any visits on 07/25/23.  Last CBC Lab Results  Component Value Date   WBC 4.7 12/31/2022   HGB 15.2 12/31/2022   HCT 44.6 12/31/2022   MCV 103 (H) 12/31/2022   MCH 35.1 (H) 12/31/2022   RDW 11.3 (L) 12/31/2022   PLT 176 12/31/2022   Last metabolic panel Lab Results  Component Value Date   GLUCOSE 112 (H) 12/31/2022   NA 140 12/31/2022   K 4.3 12/31/2022   CL 102 12/31/2022   CO2 24 12/31/2022   BUN 9 12/31/2022   CREATININE 0.70 12/31/2022   EGFR 95 12/31/2022   CALCIUM 9.4 12/31/2022   PROT 6.5 12/31/2022   ALBUMIN 4.3 12/31/2022   LABGLOB 2.2 12/31/2022   AGRATIO 2.0 12/31/2022   BILITOT 0.4 12/31/2022   ALKPHOS 55 12/31/2022   AST 29 12/31/2022   ALT 24 12/31/2022   Last lipids Lab Results  Component Value Date   CHOL 156 12/31/2022   HDL 65 12/31/2022   LDLCALC 64 12/31/2022   TRIG 163 (H) 12/31/2022   CHOLHDL 2.4 12/31/2022   Last hemoglobin A1c Lab Results  Component Value Date   HGBA1C 5.3 03/19/2020        Assessment & Plan:  Routine medical exam -     CBC with Differential/Platelet -     CMP14+EGFR -     Lipid panel -     Thyroid Panel With TSH -     Bayer DCA Hb A1c Waived  Mixed hyperlipidemia -     Rosuvastatin Calcium; Take 1 tablet (10 mg total) by mouth daily. (NEEDS TO BE SEEN BEFORE NEXT REFILL)  Dispense: 90 tablet; Refill: 1  Chronic obstructive pulmonary disease, unspecified COPD type (HCC)  Aortic atherosclerosis (HCC) -     Rosuvastatin Calcium; Take 1 tablet (10 mg  total) by mouth daily. (NEEDS TO BE SEEN BEFORE NEXT REFILL)  Dispense: 90 tablet; Refill: 1  Low vitamin D level -     VITAMIN D 25 Hydroxy (Vit-D Deficiency, Fractures)  Chronic allergic rhinitis -     Fexofenadine HCl; Take 1 tablet (180 mg total) by mouth daily.  Dispense: 90 tablet; Refill: 1  Abnormal mammogram of right breast   Hyperlipidemia: Continue Crestor 10 mg daily, Lipid ordered, refills provided Allergic rhinitis: D/c Zyrtec, star Allegra COPD: Lungs scan next week, [facility] Abnormal Mammogram: diagnostic mammogram next week Labs: CBC, CMP, Lipid, TSH, Vit D, A1c  Return in about 4 months (around 11/25/2023) for follow-up chronic disease management.   Continue healthy lifestyle choices, including diet (rich in fruits, vegetables, and lean proteins, and low in salt and simple carbohydrates) and exercise (at least 30 minutes of moderate physical activity daily).     The above assessment and management plan was discussed with the patient. The patient verbalized understanding of and has agreed to the management plan. Patient is aware to call the clinic if they develop any new symptoms or if symptoms persist or worsen. Patient is aware when to return to the clinic for a follow-up visit. Patient educated on when it is appropriate to go to the emergency department.  Arrie Aran Santa Lighter, DNP [facility] [address] [place], [place] [address] [phone]

## 2023-07-26 ENCOUNTER — Other Ambulatory Visit: Payer: Self-pay | Admitting: Nurse Practitioner

## 2023-07-26 MED ORDER — VITAMIN D (ERGOCALCIFEROL) 1.25 MG (50000 UNIT) PO CAPS: 50000.0000 [IU] | ORAL_CAPSULE | ORAL | 2 refills | Status: AC

## 2023-08-26 ENCOUNTER — Ambulatory Visit (HOSPITAL_COMMUNITY)
Admission: RE | Admit: 2023-08-26 | Discharge: 2023-08-26 | Disposition: A | Discharge status: Home / Self Care | Payer: Medicare Other | Source: Ambulatory Visit | Attending: Nurse Practitioner | Admitting: Nurse Practitioner

## 2023-08-26 DIAGNOSIS — Z87891 Personal history of nicotine dependence: Secondary | ICD-10-CM | POA: Insufficient documentation

## 2023-08-26 DIAGNOSIS — F17210 Nicotine dependence, cigarettes, uncomplicated: Secondary | ICD-10-CM | POA: Diagnosis present

## 2023-09-01 ENCOUNTER — Telehealth: Payer: Self-pay | Admitting: Acute Care

## 2023-09-01 ENCOUNTER — Telehealth: Payer: Self-pay | Admitting: Emergency Medicine

## 2023-09-01 DIAGNOSIS — J449 Chronic obstructive pulmonary disease, unspecified: Secondary | ICD-10-CM

## 2023-09-01 NOTE — Telephone Encounter (Signed)
Pt calling in to get results

## 2023-09-01 NOTE — Telephone Encounter (Signed)
Needs a script for new cord for her nebulizer

## 2023-09-01 NOTE — Telephone Encounter (Signed)
Script ordered

## 2023-09-02 NOTE — Telephone Encounter (Signed)
Spoke with patient and advised that we have not gotten her CT results yet but will contact her once results are received. Patient verbalized understanding.

## 2023-09-07 ENCOUNTER — Other Ambulatory Visit: Payer: Self-pay | Admitting: Acute Care

## 2023-09-07 DIAGNOSIS — Z122 Encounter for screening for malignant neoplasm of respiratory organs: Secondary | ICD-10-CM

## 2023-09-07 DIAGNOSIS — Z87891 Personal history of nicotine dependence: Secondary | ICD-10-CM

## 2023-09-07 DIAGNOSIS — F17210 Nicotine dependence, cigarettes, uncomplicated: Secondary | ICD-10-CM

## 2023-09-15 NOTE — Telephone Encounter (Signed)
Pt calling back for CT results, Ct result dated 09/07/2023

## 2023-09-16 NOTE — Telephone Encounter (Signed)
Contacted patient by phone and reviewed results of recent LDCT.  No suspicious findings for lung cancer.  Emphysema and atherosclerosis, as previously noted.  Patient is on statin medication.  Patient acknowledged understanding and had no questions.  Plan to repeat LDCT in one year

## 2023-09-16 NOTE — Telephone Encounter (Signed)
Patient is calling back to get results. [phone]

## 2023-09-16 NOTE — Telephone Encounter (Signed)
Left VM for pt to call back.

## 2024-02-09 ENCOUNTER — Other Ambulatory Visit: Payer: Self-pay

## 2024-02-09 DIAGNOSIS — N6310 Unspecified lump in the right breast, unspecified quadrant: Secondary | ICD-10-CM

## 2024-02-09 DIAGNOSIS — R928 Other abnormal and inconclusive findings on diagnostic imaging of breast: Secondary | ICD-10-CM

## 2024-02-09 NOTE — Progress Notes (Signed)
[place] bre

## 2024-05-08 ENCOUNTER — Encounter: Payer: Self-pay | Admitting: Nurse Practitioner

## 2024-05-09 ENCOUNTER — Ambulatory Visit: Payer: Self-pay | Admitting: Nurse Practitioner

## 2024-05-24 NOTE — Progress Notes (Signed)
 This encounter was created in error - please disregard. spoke w/pt and she is no longer a pt at [facility] t/c[facility]

## 2024-09-06 ENCOUNTER — Telehealth: Payer: Self-pay

## 2024-09-06 NOTE — Telephone Encounter (Signed)
 LVM to call office and schedule annual Lung CT.

## 2024-09-07 ENCOUNTER — Other Ambulatory Visit: Payer: Self-pay

## 2024-09-07 ENCOUNTER — Telehealth (HOSPITAL_BASED_OUTPATIENT_CLINIC_OR_DEPARTMENT_OTHER): Payer: Self-pay

## 2024-09-07 DIAGNOSIS — Z87891 Personal history of nicotine dependence: Secondary | ICD-10-CM

## 2024-09-07 DIAGNOSIS — Z122 Encounter for screening for malignant neoplasm of respiratory organs: Secondary | ICD-10-CM

## 2024-09-07 DIAGNOSIS — F17210 Nicotine dependence, cigarettes, uncomplicated: Secondary | ICD-10-CM

## 2024-09-07 NOTE — Telephone Encounter (Signed)
 LVM new order placed and advised to call our office to schedule annual Lung CT.

## 2024-09-07 NOTE — Telephone Encounter (Signed)
 Copied from CRM #[id number]. Topic: Clinical - Request for Lab/Test Order >> Sep 06, 2024  1:36 PM Isabell A wrote: Reason for CRM: Patient is requesting a new order for lung cancer screening - she called central scheduling and was told the order has been expired. Requesting a call back once ordered.   Callback number: [phone]

## 2024-10-03 ENCOUNTER — Ambulatory Visit (HOSPITAL_COMMUNITY)
Admission: RE | Admit: 2024-10-03 | Discharge: 2024-10-03 | Disposition: A | Discharge status: Home / Self Care | Source: Ambulatory Visit | Attending: Acute Care | Admitting: Acute Care

## 2024-10-03 DIAGNOSIS — Z122 Encounter for screening for malignant neoplasm of respiratory organs: Secondary | ICD-10-CM | POA: Insufficient documentation

## 2024-10-03 DIAGNOSIS — Z87891 Personal history of nicotine dependence: Secondary | ICD-10-CM | POA: Insufficient documentation

## 2024-10-03 DIAGNOSIS — F17210 Nicotine dependence, cigarettes, uncomplicated: Secondary | ICD-10-CM | POA: Insufficient documentation

## 2024-10-05 ENCOUNTER — Other Ambulatory Visit: Payer: Self-pay

## 2024-10-05 DIAGNOSIS — Z122 Encounter for screening for malignant neoplasm of respiratory organs: Secondary | ICD-10-CM

## 2024-10-05 DIAGNOSIS — Z87891 Personal history of nicotine dependence: Secondary | ICD-10-CM

## 2024-10-05 DIAGNOSIS — F17210 Nicotine dependence, cigarettes, uncomplicated: Secondary | ICD-10-CM

## 2024-10-23 ENCOUNTER — Other Ambulatory Visit: Payer: Self-pay | Admitting: Nurse Practitioner

## 2024-11-07 ENCOUNTER — Other Ambulatory Visit: Payer: Self-pay | Admitting: Nurse Practitioner

## 2024-11-07 NOTE — Telephone Encounter (Signed)
 Copied from CRM [id number]. Topic: Clinical - Medication Refill >> Nov 07, 2024  3:43 PM Abigail D wrote: Medication: albuterol  (PROVENTIL ) (2.5 MG/3ML) 0.083% nebulizer solution  Has the patient contacted their pharmacy? Yes (Agent: If no, request that the patient contact the pharmacy for the refill. If patient does not wish to contact the pharmacy document the reason why and proceed with request.) (Agent: If yes, when and what did the pharmacy advise?)  This is the patient's preferred pharmacy:  [id number] [place] [place] - [address] [place] [address] Phone: [phone] Fax: [phone]  Is this the correct pharmacy for this prescription? Yes If no, delete pharmacy and type the correct one.   Has the prescription been filled recently? No  Is the patient out of the medication? No, running very low.   Has the patient been seen for an appointment in the last year OR does the patient have an upcoming appointment? Yes  Can we respond through MyChart? Yes  Agent: Please be advised that Rx refills may take up to 3 business days. We ask that you follow-up with your pharmacy.

## 2024-11-15 ENCOUNTER — Telehealth: Payer: Self-pay | Admitting: Emergency Medicine

## 2024-11-15 MED ORDER — ALBUTEROL SULFATE (2.5 MG/3ML) 0.083% IN NEBU: 2.5000 mg | INHALATION_SOLUTION | Freq: Four times a day (QID) | RESPIRATORY_TRACT | 1 refills | Status: AC | PRN

## 2024-11-15 NOTE — Telephone Encounter (Signed)
 Copied from CRM [id number]. Topic: Clinical - Medication Refill >> Nov 07, 2024  3:43 PM Abigail D wrote: Medication: albuterol  (PROVENTIL ) (2.5 MG/3ML) 0.083% nebulizer solution  Has the patient contacted their pharmacy? Yes (Agent: If no, request that the patient contact the pharmacy for the refill. If patient does not wish to contact the pharmacy document the reason why and proceed with request.) (Agent: If yes, when and what did the pharmacy advise?)  This is the patient's preferred pharmacy:  [id number] [place] [place] - [address] [place] [address] Phone: [phone] Fax: [phone]  Is this the correct pharmacy for this prescription? Yes If no, delete pharmacy and type the correct one.   Has the prescription been filled recently? No  Is the patient out of the medication? No, running very low.   Has the patient been seen for an appointment in the last year OR does the patient have an upcoming appointment? Yes  Can we respond through MyChart? Yes  Agent: Please be advised that Rx refills may take up to 3 business days. We ask that you follow-up with your pharmacy. >> Nov 15, 2024  2:48 PM Leila BROCKS wrote: Patient [phone] states almost out of medication albuterol  (PROVENTIL ) (2.5 MG/3ML) 0.083% nebulizer solution. Patient was last seen 11/10/22 with Dr. Shelah. Patient states cannot drive and patient's husband work third shift and it unable to take patient to the office. [facility]. Scheduled with Dr. Shelah 01/25/25 at 11 am. Patient would like refill until patient comes in for office visit? Please advise and call back.   [id number] [place] [place] -  [address] [place] [address] Phone: [phone] Fax: [phone]

## 2024-11-15 NOTE — Telephone Encounter (Signed)
 ATC x1.  LVM to return call.  Courtesy refill provided for albuterol  nebulizer solution.  Patient must keep upcoming appointment for further refills.

## 2024-11-15 NOTE — Addendum Note (Signed)
 Addended by: ROLANDA POWELL SAILOR on: 11/15/2024 04:59 PM   Modules accepted: Orders

## 2024-11-16 NOTE — Telephone Encounter (Signed)
 Copied from CRM [id number]. Topic: General - Other >> Nov 16, 2024 10:05 AM Russell PARAS wrote: Reason for CRM:   Pt returning call from [place]. Contacted CAL, [place] advised no one in triage to take call.   Pt requested call back  CB#  [phone]   Called and spoke to pt - advised that courtesy refill of albuterol  nebulizer solution had been sent to her pharmacy. Also advised pt to keep her appt in January for any further refills. Pt verbalized understanding, NFN.

## 2024-11-16 NOTE — Telephone Encounter (Signed)
 ATC X2. LMTCB

## 2025-01-25 ENCOUNTER — Encounter: Payer: Self-pay | Admitting: Emergency Medicine

## 2025-01-25 ENCOUNTER — Ambulatory Visit: Admitting: Emergency Medicine

## 2025-01-25 VITALS — BP 126/72 | HR 91 | Temp 98.2°F | Ht 61.5 in | Wt 100.8 lb

## 2025-01-25 DIAGNOSIS — J449 Chronic obstructive pulmonary disease, unspecified: Secondary | ICD-10-CM | POA: Diagnosis not present

## 2025-01-25 DIAGNOSIS — F17210 Nicotine dependence, cigarettes, uncomplicated: Secondary | ICD-10-CM

## 2025-01-25 DIAGNOSIS — F17200 Nicotine dependence, unspecified, uncomplicated: Secondary | ICD-10-CM

## 2025-01-25 MED ORDER — DOXYCYCLINE HYCLATE 100 MG PO TABS: 100.0000 mg | ORAL_TABLET | Freq: Two times a day (BID) | ORAL | 0 refills | Status: AC

## 2025-01-25 MED ORDER — PREDNISONE 10 MG PO TABS: ORAL_TABLET | ORAL | 0 refills | Status: AC

## 2025-01-25 MED ORDER — BREZTRI AEROSPHERE 160-9-4.8 MCG/ACT IN AERO: 2.0000 | INHALATION_SPRAY | Freq: Two times a day (BID) | RESPIRATORY_TRACT | 11 refills | Status: AC

## 2025-01-25 MED ORDER — BREZTRI AEROSPHERE 160-9-4.8 MCG/ACT IN AERO: INHALATION_SPRAY | RESPIRATORY_TRACT | Status: AC

## 2025-01-25 NOTE — Assessment & Plan Note (Signed)
 She is still a heavy smoker.  She understands that we need to try to cut down before considering a stop date.  We will try to continue to support her in this effort.

## 2025-01-25 NOTE — Assessment & Plan Note (Signed)
 With an acute exacerbation in the setting of URI.  She was tested for flu A/B and was negative but she did have a positive flu exposure.  Plan to treat with prednisone , doxycycline . She needs to be back on scheduled BD therapy.  We lost touch with each other and she is now only using albuterol  as needed.  I would like to try Breztri  to see if she gets benefit.  Please take prednisone  as directed until completely gone. Please take doxycycline  as directed until completely gone We will try to get you back on scheduled inhaler medication.  We will start a medication called Breztri .  Take 2 puffs twice a day on a schedule.  You need to rinse and gargle after you take this medication.  Keep track of how the medication helps you so we can decide if you are going to continue it. Keep your albuterol  available to use 2 puffs up to every 4 hours if needed for shortness of breath, chest tightness, wheezing. Please follow with Dr. Shelah in 3 months so we can see how you are doing on the new inhaler.

## 2025-01-25 NOTE — Progress Notes (Signed)
 "  Subjective:    Patient ID: Jeanne Becker, female    DOB: 1956-12-17, 69 y.o.   MRN: [id number]  COPD Her past medical history is significant for COPD.    ROV 07/15/22 --follow-up visit 69 year old woman with a history of continued tobacco use and moderately severe COPD.  She has positive bronchodilator response with an asthmatic component.  Also allergic rhinitis.  As of last visit she has been managed on Breo (had been on Trelegy but insurance and payment issues).  She ran out [place] 2 weeks ago.         Uses albuterol  approximately 4x a day currently (off the [place]). She has had more exertional SOB,m wheeze - possibly due to air quality. Has had nasal congestion and then drainage. She is on flonase  qd, astelin  qd. Xyzal   Recently given prednisone  and azithro by her PCP - hasn't started the pred yuet.   ROV 11/10/22 --69 year old woman with a history of moderately severe COPD and continued tobacco use.  She has a positive bronchodilator response.  She also has allergic rhinitis.  She been on Trelegy in the past but had stopped cost reasons, most recently was on Breo in July.  She deals with chronic cough Her most recent lung cancer screening CT scan was 11/25/2020.  She is currently smoking less than a pack a day.  She has Flonase  and Astelin .  She is using these reliably. Using an OTC anti-histamine.  She reports that she able to exert, is able to do her daily chores. Her albuterol  use is up and down, sometimes as much as 3-4x a day. No pred since last time.  Due for flu shot, wants to defer covid shot.   ROV 01/25/2025 --follow-up visit 69 year old woman whom I have followed for tobacco use (1 pk/day) and associated moderately severe COPD.  She has a bronchodilator response with asthmatic features.  Has been treated with Trelegy and Breo in the past, currently on albuterol  as needed - about 1-3x a day. She was exposed to influenza in the last 2 weeks, reports has been dealing with nasal  congestion and scratchy throat, L ear fullness. She was seen by her PCP, was treated w azithro, no steroids. Her rapid flu A/B was negative at that visit.    Review of Systems As per HPI     Objective:   Physical Exam Vitals:   01/25/25 1106  BP: 126/72  Pulse: 91  Temp: 98.2 F (36.8 C)  SpO2: 97%  Weight: 100 lb 12.8 oz (45.7 kg)  Height: 5' 1.5 (1.562 m)   Gen: Pleasant, thin woman, in no distress,  normal affect  ENT: No lesions,  mouth clear, no postnasal drip  Neck: No JVD, no stridor  Lungs: No use of accessory muscle, no wheezing.  Very distant.  Few scattered expiratory rhonchi  Cardiovascular: RRR, heart sounds normal, no murmur or gallops, no peripheral edema  Musculoskeletal: No deformities, no cyanosis or clubbing  Neuro: alert, non focal  Skin: Warm, no lesions or rashes      Assessment & Plan:  COPD, moderately severe With an acute exacerbation in the setting of URI.  She was tested for flu A/B and was negative but she did have a positive flu exposure.  Plan to treat with prednisone , doxycycline . She needs to be back on scheduled BD therapy.  We lost touch with each other and she is now only using albuterol  as needed.  I would like to try Breztri  to see  if she gets benefit.  Please take prednisone  as directed until completely gone. Please take doxycycline  as directed until completely gone We will try to get you back on scheduled inhaler medication.  We will start a medication called Breztri .  Take 2 puffs twice a day on a schedule.  You need to rinse and gargle after you take this medication.  Keep track of how the medication helps you so we can decide if you are going to continue it. Keep your albuterol  available to use 2 puffs up to every 4 hours if needed for shortness of breath, chest tightness, wheezing. Please follow with Dr. Shelah in 3 months so we can see how you are doing on the new inhaler.  Current smoker She is still a heavy smoker.  She  understands that we need to try to cut down before considering a stop date.  We will try to continue to support her in this effort.  I personally spent a total of 20 minutes in the care of the patient today including preparing to see the patient, getting/reviewing separately obtained history, performing a medically appropriate exam/evaluation, counseling and educating, placing orders, documenting clinical information in the EHR, independently interpreting results, and communicating results.   Lamar Shelah, MD, PhD 01/25/2025, 11:29 AM [facility] [phone] or if no answer [phone]  "

## 2025-01-25 NOTE — Addendum Note (Signed)
 Addended byBETHA FRIES, Nahuel Wilbert A on: 01/25/2025 11:35 AM   Modules accepted: Orders

## 2025-01-25 NOTE — Patient Instructions (Signed)
 Please take prednisone  as directed until completely gone. Please take doxycycline  as directed until completely gone We will try to get you back on scheduled inhaler medication.  We will start a medication called Breztri .  Take 2 puffs twice a day on a schedule.  You need to rinse and gargle after you take this medication.  Keep track of how the medication helps you so we can decide if you are going to continue it. Keep your albuterol  available to use 2 puffs up to every 4 hours if needed for shortness of breath, chest tightness, wheezing. We need to work on helping you decrease your cigarettes.  Goal will be to stop altogether.  We need to start by just cutting down.  We can discuss further next time. Please follow with Dr. Shelah in 3 months so we can see how you are doing on the new inhaler.

## 2025-01-29 ENCOUNTER — Other Ambulatory Visit (HOSPITAL_COMMUNITY): Payer: Self-pay

## 2025-01-29 ENCOUNTER — Telehealth: Payer: Self-pay

## 2025-01-29 NOTE — Telephone Encounter (Signed)
 Copied from CRM #[id number]. Topic: Clinical - Medication Question >> Jan 25, 2025  3:48 PM Rozanna MATSU wrote: Reason for CRM: pt called stated she went to pick up her meds and stated she can not afford the budesonide -glycopyrrolate-formoterol  (BREZTRI  AEROSPHERE) 160-9-4.8 MCG/ACT AERO inhaler and would like alternate. Stated she will contact her insurance company also   Pt was provided 2 samples at [facility] 01/26/2024. Spoke with the pt and she states Breztri  is to expensive, unable to afford.  Pt also stated she is going to [employer] company and will call back with more information.  Pharmacy, can you please advise formulary alternatives for Breztri ?

## 2025-01-30 ENCOUNTER — Other Ambulatory Visit (HOSPITAL_COMMUNITY): Payer: Self-pay

## 2025-01-30 NOTE — Telephone Encounter (Signed)
 Atc x1. LMTCB regarding changing inhaler.

## 2025-01-30 NOTE — Telephone Encounter (Signed)
 Called and spoke with the pt and she states she does not want to switch to Advair right now since it could result in worsening symptoms.  She said she has never had a deductible to meet so she is going to call insurance company. Pt will call office back with more info.  No orders placed or changes made.

## 2025-01-30 NOTE — Telephone Encounter (Signed)
 There is no LABA/LAMA/ICS on this list.  We could consider changing her to Advair discus but she would need to know that this has less medication in it (the LAMA is not there), might result in worsening symptoms.  If she wants to make that change then okay to go ahead and order Advair 250/50
